# Patient Record
Sex: Female | Born: 1945
Health system: Southern US, Community
[De-identification: ages and names within clinical notes are randomized; demographics above are authoritative.]

## PROBLEM LIST (undated history)

## (undated) DIAGNOSIS — G43909 Migraine, unspecified, not intractable, without status migrainosus: Secondary | ICD-10-CM

## (undated) DIAGNOSIS — E785 Hyperlipidemia, unspecified: Secondary | ICD-10-CM

## (undated) DIAGNOSIS — I7300 Raynaud's syndrome without gangrene: Secondary | ICD-10-CM

## (undated) HISTORY — PX: DILATION AND CURETTAGE OF UTERUS: SHX78

## (undated) HISTORY — DX: Migraine, unspecified, not intractable, without status migrainosus: G43.909

## (undated) HISTORY — DX: Hyperlipidemia, unspecified: E78.5

## (undated) HISTORY — PX: MOHS SURGERY: SHX181

## (undated) HISTORY — DX: Raynaud's syndrome without gangrene: I73.00

---

## 1993-01-12 HISTORY — PX: ABDOMINAL HYSTERECTOMY: SHX81

## 2003-01-13 HISTORY — PX: COLONOSCOPY: SHX174

## 2004-03-04 ENCOUNTER — Ambulatory Visit: Payer: Self-pay | Admitting: General Surgery

## 2005-03-12 ENCOUNTER — Ambulatory Visit: Payer: Self-pay | Admitting: General Surgery

## 2005-03-23 ENCOUNTER — Ambulatory Visit: Payer: Self-pay | Admitting: General Surgery

## 2005-03-26 ENCOUNTER — Encounter: Payer: Self-pay | Admitting: Internal Medicine

## 2006-03-17 ENCOUNTER — Ambulatory Visit: Payer: Self-pay | Admitting: General Surgery

## 2006-03-30 ENCOUNTER — Ambulatory Visit: Payer: Self-pay | Admitting: General Surgery

## 2007-01-04 DIAGNOSIS — C4491 Basal cell carcinoma of skin, unspecified: Secondary | ICD-10-CM

## 2007-01-04 HISTORY — DX: Basal cell carcinoma of skin, unspecified: C44.91

## 2007-03-23 ENCOUNTER — Ambulatory Visit: Payer: Self-pay | Admitting: General Surgery

## 2007-10-27 ENCOUNTER — Ambulatory Visit: Payer: Self-pay | Admitting: Internal Medicine

## 2007-10-27 DIAGNOSIS — G43009 Migraine without aura, not intractable, without status migrainosus: Secondary | ICD-10-CM | POA: Insufficient documentation

## 2007-12-12 DIAGNOSIS — L570 Actinic keratosis: Secondary | ICD-10-CM

## 2007-12-12 HISTORY — DX: Actinic keratosis: L57.0

## 2008-01-13 LAB — HM DEXA SCAN: HM DEXA SCAN: NORMAL

## 2008-01-23 DIAGNOSIS — D239 Other benign neoplasm of skin, unspecified: Secondary | ICD-10-CM

## 2008-01-23 HISTORY — DX: Other benign neoplasm of skin, unspecified: D23.9

## 2008-03-23 ENCOUNTER — Ambulatory Visit: Payer: Self-pay | Admitting: General Surgery

## 2008-04-10 ENCOUNTER — Encounter: Payer: Self-pay | Admitting: Internal Medicine

## 2008-04-27 ENCOUNTER — Encounter: Payer: Self-pay | Admitting: Internal Medicine

## 2008-12-14 ENCOUNTER — Telehealth: Payer: Self-pay | Admitting: Internal Medicine

## 2009-03-21 ENCOUNTER — Ambulatory Visit: Payer: Self-pay | Admitting: Internal Medicine

## 2009-03-21 DIAGNOSIS — D2370 Other benign neoplasm of skin of unspecified lower limb, including hip: Secondary | ICD-10-CM | POA: Insufficient documentation

## 2009-03-22 LAB — CONVERTED CEMR LAB
Albumin: 4.1 g/dL (ref 3.5–5.2)
Alkaline Phosphatase: 65 units/L (ref 39–117)
BUN: 13 mg/dL (ref 6–23)
Basophils Absolute: 0 10*3/uL (ref 0.0–0.1)
Bilirubin, Direct: 0 mg/dL (ref 0.0–0.3)
Eosinophils Absolute: 0.3 10*3/uL (ref 0.0–0.7)
Glucose, Bld: 88 mg/dL (ref 70–99)
Lymphocytes Relative: 31.3 % (ref 12.0–46.0)
MCHC: 34 g/dL (ref 30.0–36.0)
Neutrophils Relative %: 54.1 % (ref 43.0–77.0)
Phosphorus: 3.7 mg/dL (ref 2.3–4.6)
Potassium: 4.1 meq/L (ref 3.5–5.1)
RBC: 3.73 M/uL — ABNORMAL LOW (ref 3.87–5.11)
RDW: 12.2 % (ref 11.5–14.6)

## 2009-03-25 ENCOUNTER — Ambulatory Visit: Payer: Self-pay | Admitting: General Surgery

## 2009-04-11 ENCOUNTER — Encounter: Payer: Self-pay | Admitting: Internal Medicine

## 2010-02-11 NOTE — Assessment & Plan Note (Signed)
Summary: 1:45 CPX/DLO   Vital Signs:  Patient profile:   65 year old female Weight:      125 pounds BMI:     22.76 Temp:     98.2 degrees F oral Pulse rate:   76 / minute Pulse rhythm:   regular BP sitting:   110 / 72  (left arm) Cuff size:   regular  Vitals Entered By: Mervin Hack CMA Duncan Dull) (March 21, 2009 1:31 PM) CC: adult physical   History of Present Illness: doing well No new concerns  Having some trouble sleeping due to teeth grinding Has a mouth guard but not helping Dentist is helping with her  Stopped with Dr Charlies Silvers Dr Lemar Livings for breast exams and he orders the mammograms    Allergies: 1)  ! Codeine Sulfate (Codeine Sulfate)  Past History:  Past medical, surgical, family and social histories (including risk factors) reviewed for relevance to current acute and chronic problems.  Past Medical History: Reviewed history from 10/27/2007 and no changes required. Migraines since childhood Endometriosis Raynaud's phenomenon  CONSULTANTS Dr Arvil Chaco Dr Tyson Alias breast exams  Past Surgical History: Reviewed history from 10/27/2007 and no changes required. D&C after miscarriages twice C-section Hysterectomy/BSO  06/14/1993  Family History: Dad died of lymphoma @66  Mom died from CHF @72  3 sisters--migraines run in family No CAD, DM, HTN  Social History: Reviewed history from 10/27/2007 and no changes required. 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 Regular beer  Review of Systems General:  Stopped exercise last fall---stress and lack of desire sleep isn't great but no sig daytime somnolence wears seat belt. Eyes:  Denies double vision and vision loss-1 eye. ENT:  Denies decreased hearing and ringing in ears; teeth okay in general--regular with dentist. CV:  Denies chest pain or discomfort, difficulty breathing  at night, difficulty breathing while lying down, fainting, lightheadness, palpitations, and shortness of breath with exertion. Resp:  Denies cough and shortness of breath. GI:  Denies abdominal pain, bloody stools, change in bowel habits, dark tarry stools, indigestion, nausea, and vomiting; mild swallowing problems with meat--just has to eat slowly discussed PPI if worsens. GU:  Denies dysuria and incontinence; No sexual problems. MS:  Denies joint pain and joint swelling. Derm:  Complains of lesion(s); denies rash; nodule on right calf. Neuro:  Complains of headaches; denies numbness, tingling, and weakness; uses migraine meds occ topamax for prophylaxis----only gets migraines every 3-4 months or so. Psych:  Denies anxiety and depression. Heme:  Denies abnormal bruising and enlarge lymph nodes. Allergy:  Denies seasonal allergies and sneezing.  Physical Exam  General:  alert and normal appearance.   Eyes:  pupils equal, pupils round, pupils reactive to light, and no optic disk abnormalities.   Ears:  R ear normal and L ear normal.   Mouth:  no erythema, no exudates, and no lesions.   Neck:  supple, no masses, no thyromegaly, no carotid bruits, and no cervical lymphadenopathy.   Lungs:  normal respiratory effort and normal breath sounds.   Heart:  normal rate, regular rhythm, no murmur, and no gallop.   Abdomen:  soft and non-tender.   Msk:  no joint tenderness and no joint swelling.   Pulses:  normal in feet Extremities:  no edema Neurologic:  alert & oriented X3, strength normal in all extremities, and gait normal.   Skin:  no rashes and no ulcerations.   dermatofibroma on  mid lateral right calf Axillary Nodes:  No palpable lymphadenopathy Psych:  normally interactive, good eye contact, not anxious appearing, and not depressed appearing.     Impression & Recommendations:  Problem # 1:  PREVENTIVE HEALTH CARE (ICD-V70.0) Assessment Comment Only healthy up to date with  colon will get mammo through Dr Lemar Livings  Problem # 2:  MIGRAINE HEADACHE (ICD-346.90) Assessment: Unchanged  still gets occ so will continue the topamax  Her updated medication list for this problem includes:    Relpax 40 Mg Tabs (Eletriptan hydrobromide) .Marland Kitchen... Take one by mouth at onset of headache  Orders: TLB-Renal Function Panel (80069-RENAL) TLB-CBC Platelet - w/Differential (85025-CBCD) TLB-Hepatic/Liver Function Pnl (80076-HEPATIC) TLB-TSH (Thyroid Stimulating Hormone) (84443-TSH) Venipuncture (35573)  Problem # 3:  DERMATOFIBROMA, LEG, RIGHT (ICD-216.7) Assessment: New  treated with liquid nitrogen 1 minute x 2 tolerated well and discussed home care  Orders: Cryotherapy/Destruction benign or premalignant lesion (1st lesion)  (17000)  Complete Medication List: 1)  Topamax 50 Mg Tabs (Topiramate) .... Take 2 by mouth daily 2)  Relpax 40 Mg Tabs (Eletriptan hydrobromide) .... Take one by mouth at onset of headache 3)  Super B Complex Tabs (B complex-c) .... Take one by mouth daily 4)  Centrum Silver Tabs (Multiple vitamins-minerals) .... Take one by mouth daily  Patient Instructions: 1)  Please start omeprazole 20mg  at bedtime if you have any worsening of your swallowing problems 2)  Please try a general multivitamin as well as additional vitamin D 400 international units  3)  Please schedule a follow-up appointment in 1 year.   Current Allergies (reviewed today): ! CODEINE SULFATE (CODEINE SULFATE)

## 2010-02-11 NOTE — Letter (Signed)
Summary: [facility]  [facility]   Imported By: Maryln Gottron 04/22/2009 13:02:43  _____________________________________________________________________  External Attachment:    Type:   Image     Comment:   External Document  Appended Document: [facility]    Clinical Lists Changes  Observations: Added new observation of MAMMOGRAM: No specific mammographic evidence of malignancy.  Assessment: BIRADS 1.  (03/25/2009 13:08)       Mammogram  Procedure date:  03/25/2009  Findings:      No specific mammographic evidence of malignancy.  Assessment: BIRADS 1.

## 2010-03-06 ENCOUNTER — Encounter: Payer: Self-pay | Admitting: Internal Medicine

## 2010-03-06 ENCOUNTER — Ambulatory Visit (INDEPENDENT_AMBULATORY_CARE_PROVIDER_SITE_OTHER): Payer: BC Managed Care – PPO | Admitting: Internal Medicine

## 2010-03-06 DIAGNOSIS — J019 Acute sinusitis, unspecified: Secondary | ICD-10-CM | POA: Insufficient documentation

## 2010-03-10 ENCOUNTER — Encounter: Payer: Self-pay | Admitting: Internal Medicine

## 2010-03-11 ENCOUNTER — Encounter: Payer: Self-pay | Admitting: Internal Medicine

## 2010-03-11 NOTE — Assessment & Plan Note (Signed)
Summary: SORE THROAT , COUGH- APPT SCHED FOR 1:45PM   Vital Signs:  Patient profile:   65 year old female Weight:      129 pounds BMI:     23.49 Temp:     98.9 degrees F oral Pulse rate:   114 / minute Pulse rhythm:   regular Resp:     14 per minute BP sitting:   118 / 85  (left arm) Cuff size:   regular  Vitals Entered By: Jacqueline Meza CMA Duncan Dull) (March 06, 2010 1:30 PM) CC: sore throat, congestion, achey   History of Present Illness: has been sick for 4-5 days Scratchy throat at first and gradual worsening Congesiton in head without much  mucous Sinus headache Not much drainage Now with cough  No fever Has had some chills and sweats No SOB--just some pain in throat with breathing  Allergies: 1)  ! Codeine Sulfate (Codeine Sulfate)  Past History:  Past medical, surgical, family and social histories (including risk factors) reviewed for relevance to current acute and chronic problems.  Past Medical History: Reviewed history from 10/27/2007 and no changes required. Migraines since childhood Endometriosis Raynaud's phenomenon  CONSULTANTS Dr Arvil Chaco Dr Tyson Alias breast exams  Past Surgical History: Reviewed history from 10/27/2007 and no changes required. D&C after miscarriages twice C-section Hysterectomy/BSO  1995  Family History: Reviewed history from 03/21/2009 and no changes required. Dad died of lymphoma @66  Mom died from CHF @72  3 sisters--migraines run in family No CAD, DM, HTN  Social History: Reviewed history from 10/27/2007 and no changes required. 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 Regular beer  Review of Systems       Missed work only on Monday---mostly due to snow No vomiting  No diarrhea appetite is okay  Physical Exam  General:  alert.  NAD Head:  no frontal or maxillary  tenderness Ears:  R ear normal and L ear normal.   Nose:  moderate inflammation and swelling slight purulent mucus on left Mouth:  mild pharyngeal injection without exudate Neck:  supple, no masses, and no cervical lymphadenopathy.   Lungs:  normal respiratory effort, no intercostal retractions, no accessory muscle use, normal breath sounds, no crackles, and no wheezes.     Impression & Recommendations:  Problem # 1:  SINUSITIS - ACUTE-NOS (ICD-461.9) Assessment New  may still be viral but worsening daily discussed supportive care with analgesics, Vick's, etc  will give Rx for amoxil to start if worsens  Her updated medication list for this problem includes:    Amoxicillin 500 Mg Tabs (Amoxicillin) .Marland Kitchen... 2 tabs by mouth two times a day for sinus infection  Complete Medication List: 1)  Relpax 40 Mg Tabs (Eletriptan hydrobromide) .... Take one by mouth at onset of headache 2)  Amoxicillin 500 Mg Tabs (Amoxicillin) .... 2 tabs by mouth two times a day for sinus infection  Patient Instructions: 1)  Please set up your physical at your convenience Prescriptions: AMOXICILLIN 500 MG TABS (AMOXICILLIN) 2 tabs by mouth two times a day for sinus infection  #40 x 0   Entered and Authorized by:   Jacqueline Salt MD   Signed by:   Jacqueline Salt MD on 03/06/2010   Method used:   Print then Give to Patient   RxID:   [id number] RELPAX 40 MG TABS (ELETRIPTAN HYDROBROMIDE) Take one by mouth at onset of headache  #8 x 3  Entered and Authorized by:   Jacqueline Salt MD   Signed by:   Jacqueline Salt MD on 03/06/2010   Method used:   Print then Give to Patient   RxID:   [id number]      Current Allergies (reviewed today): ! CODEINE SULFATE (CODEINE SULFATE)

## 2010-03-28 ENCOUNTER — Ambulatory Visit: Payer: Self-pay | Admitting: General Surgery

## 2010-04-01 ENCOUNTER — Encounter: Payer: BC Managed Care – PPO | Admitting: Internal Medicine

## 2010-04-11 ENCOUNTER — Ambulatory Visit (INDEPENDENT_AMBULATORY_CARE_PROVIDER_SITE_OTHER): Payer: BC Managed Care – PPO | Admitting: Internal Medicine

## 2010-04-11 ENCOUNTER — Encounter: Payer: Self-pay | Admitting: Internal Medicine

## 2010-04-11 VITALS — BP 120/70 | HR 70 | Temp 98.5°F | Ht 62.0 in | Wt 129.0 lb

## 2010-04-11 DIAGNOSIS — Z0000 Encounter for general adult medical examination without abnormal findings: Secondary | ICD-10-CM | POA: Insufficient documentation

## 2010-04-11 NOTE — Progress Notes (Signed)
Subjective:    Patient ID: Jacqueline Meza, female    DOB: 03/29/1945, 65 y.o.   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 Migraines     since childhood  . Endometriosis   . Raynaud's phenomenon     Past Surgical History  Procedure Date  . Dilation and curettage of uterus     done after miscarriages twice  . Cesarean section   . Abdominal hysterectomy 01/12/1993    Family History  Problem Relation Age of Onset  . Migraines Sister   . Migraines Sister   . Migraines Sister     History   Social History  . Marital Status: Divorced    Spouse Name: N/A    Number of Children: 1  . Years of Education: N/A   Occupational History  . Curriculum development     [employer]   Social History Main Topics  . Smoking status: Never Smoker   . Smokeless tobacco: Not on file  . Alcohol Use: Yes     regular beer  . Drug Use: Not on file  . Sexually Active: Not on file   Other Topics Concern  . Not on file   Social History Narrative   Lives with twin sister [name]     Review of Systems  Constitutional: Positive for fatigue. Negative for appetite change and unexpected weight change.       [occ fatigue--no excessive somnolence Wears seat belt HENT: Negative for hearing loss, congestion, sore throat, rhinorrhea, postnasal drip and tinnitus.   Eyes: Negative for visual disturbance.       [Corneal tear found on left--went to [place]. Seems to be healing No vision loss Respiratory: Negative for cough and shortness of breath.   Cardiovascular: Negative for chest pain, palpitations and leg swelling.  Gastrointestinal: Negative for nausea, vomiting, constipation and  blood in stool.       [No indigestion Genitourinary: Negative for dysuria and difficulty urinating.       [No incontinence Musculoskeletal: Negative for back pain, joint swelling and arthralgias.  Skin: Negative for rash.       [No suspicious lesions now Sees Dr Gwen Pounds every 6 months Neurological: Positive for headaches. Negative for dizziness, syncope, weakness and numbness.       [Chronic headaches--relpax helps Hematological: Negative for adenopathy. Does not bruise/bleed easily.  Psychiatric/Behavioral: Negative for sleep disturbance and dysphoric mood. The patient is not nervous/anxious.        [Chronic sleep problems       Objective:   Physical Exam  Constitutional: She is oriented to person, place, and time. She appears well-developed and well-nourished. No distress.  HENT:  Head: Normocephalic and atraumatic.  Mouth/Throat: Oropharynx is clear and moist. No oropharyngeal exudate.       TM's normal  Eyes: Conjunctivae and EOM are normal. Pupils are equal, round, and reactive to light. Right eye exhibits no discharge. Left eye exhibits no discharge.       Fundi benign  Neck: Normal range of motion. Neck supple. No thyromegaly present.  Cardiovascular: Normal rate, regular rhythm, normal heart sounds and intact distal pulses.  Exam reveals no gallop.   No murmur heard. Pulmonary/Chest: Effort normal and breath sounds normal.  No respiratory distress. She has no wheezes. She has no rales.  Abdominal: Soft. She exhibits no mass. There is no tenderness.  Musculoskeletal: Normal range of motion. She exhibits no edema and no tenderness.  Lymphadenopathy:    She has no cervical adenopathy.  Neurological: She is alert and oriented to person, place, and time. She exhibits normal muscle tone.       Normal strength  Skin: Skin is warm. No rash noted. No erythema.  Psychiatric: She has a normal mood and affect. Her behavior is normal. Judgment and thought content normal.           Assessment & Plan:

## 2010-10-02 ENCOUNTER — Telehealth: Payer: Self-pay | Admitting: *Deleted

## 2010-10-02 NOTE — Telephone Encounter (Signed)
Spoke with patient and advised results   

## 2010-10-02 NOTE — Telephone Encounter (Signed)
Patient walked in to front desk and dropped off lab results done by [facility] and wanted Dr.Letvak to look at her labs and give his advice. Form on your desk

## 2010-10-02 NOTE — Telephone Encounter (Signed)
Please let her know the chol levels are actually pretty good Thyroid is okay---I might just repeat this at her next physical Liver test is up some but I don't usually check the GGT since it can be elevated with just 1 alcoholic drink the day before or other such factors. Again, I might just recheck at her next physical Blood count is fine but may need to check vitamin b12. If she is a vegetarian, or close, she should take a daily B12 supplement

## 2010-10-29 ENCOUNTER — Other Ambulatory Visit: Payer: Self-pay | Admitting: Internal Medicine

## 2010-11-30 ENCOUNTER — Other Ambulatory Visit: Payer: Self-pay | Admitting: Internal Medicine

## 2010-12-01 NOTE — Telephone Encounter (Signed)
Ok to refill 

## 2010-12-01 NOTE — Telephone Encounter (Signed)
Rx done electronically 

## 2011-04-10 ENCOUNTER — Ambulatory Visit: Payer: BC Managed Care – PPO | Admitting: Internal Medicine

## 2011-04-10 ENCOUNTER — Other Ambulatory Visit: Payer: Self-pay | Admitting: Internal Medicine

## 2011-04-13 NOTE — Telephone Encounter (Signed)
rx sent to pharmacy by e-script  

## 2011-04-13 NOTE — Telephone Encounter (Signed)
Okay #8 x 3 

## 2011-04-20 ENCOUNTER — Ambulatory Visit (INDEPENDENT_AMBULATORY_CARE_PROVIDER_SITE_OTHER): Payer: BC Managed Care – PPO | Admitting: Internal Medicine

## 2011-04-20 ENCOUNTER — Encounter: Payer: Self-pay | Admitting: Internal Medicine

## 2011-04-20 ENCOUNTER — Ambulatory Visit: Payer: Self-pay | Admitting: Internal Medicine

## 2011-04-20 VITALS — BP 112/72 | HR 80 | Temp 98.3°F | Ht 62.0 in | Wt 135.0 lb

## 2011-04-20 DIAGNOSIS — G43909 Migraine, unspecified, not intractable, without status migrainosus: Secondary | ICD-10-CM

## 2011-04-20 DIAGNOSIS — Z0000 Encounter for general adult medical examination without abnormal findings: Secondary | ICD-10-CM

## 2011-04-20 LAB — CBC WITH DIFFERENTIAL/PLATELET
Basophils Absolute: 0 10*3/uL (ref 0.0–0.1)
Eosinophils Absolute: 0.2 10*3/uL (ref 0.0–0.7)
HCT: 42.6 % (ref 36.0–46.0)
Hemoglobin: 14.3 g/dL (ref 12.0–15.0)
Lymphs Abs: 1.9 10*3/uL (ref 0.7–4.0)
MCHC: 33.7 g/dL (ref 30.0–36.0)
MCV: 104.4 fl — ABNORMAL HIGH (ref 78.0–100.0)
Monocytes Absolute: 0.5 10*3/uL (ref 0.1–1.0)
Monocytes Relative: 10 % (ref 3.0–12.0)
Neutro Abs: 2.8 10*3/uL (ref 1.4–7.7)
Platelets: 196 10*3/uL (ref 150.0–400.0)
RDW: 13.6 % (ref 11.5–14.6)

## 2011-04-20 LAB — LIPID PANEL
HDL: 74.8 mg/dL (ref >39.00)
Triglycerides: 77 mg/dL (ref 0.0–149.0)

## 2011-04-20 LAB — BASIC METABOLIC PANEL
BUN: 15 mg/dL (ref 6–23)
CO2: 27 mEq/L (ref 19–32)
GFR: 88.03 mL/min (ref >60.00)
Glucose, Bld: 92 mg/dL (ref 70–99)
Potassium: 4.5 mEq/L (ref 3.5–5.1)

## 2011-04-20 LAB — HEPATIC FUNCTION PANEL
Albumin: 4.6 g/dL (ref 3.5–5.2)
Total Protein: 7.2 g/dL (ref 6.0–8.3)

## 2011-04-20 LAB — TSH: TSH: 1.83 u[IU]/mL (ref 0.35–5.50)

## 2011-04-20 MED ORDER — TOPIRAMATE 25 MG PO TABS: 25.0000 mg | ORAL_TABLET | Freq: Every day | ORAL | Status: DC

## 2011-04-20 NOTE — Assessment & Plan Note (Signed)
Has been worsening Will go back on topirimate Start at 25mg  and increase

## 2011-04-20 NOTE — Assessment & Plan Note (Signed)
Healthy Has mammo scheduled this afternoon zostavax if covered

## 2011-04-20 NOTE — Progress Notes (Signed)
Subjective:    Patient ID: Jacqueline Meza, female    DOB: [birth year], 66 y.o.   MRN: [id number]  HPI Discouraged by her weight gain Wakes 3-5 miles many days, and water aerobics Really careful with diet  Has retired Doing well with this No longer goes to Dr Lemar Livings for breast exams  More migraines of late topamax did prevent As many as 2 a week----now close to one a week relpax mostly will abort---occ needs 2nd dose to clear completely  Current Outpatient Prescriptions on File Prior to Visit  Medication Sig Dispense Refill  . Multiple Vitamins-Minerals (ICAPS PO) Take by mouth daily.        . RELPAX 40 MG tablet TAKE 1 TABLET BY MOUTH AT ONSET OF HEADACHE  8 tablet  3    Allergies  Allergen Reactions  . Codeine Sulfate     REACTION: headache and nausea    Past Medical History  Diagnosis Date  . Migraines     since childhood  . Endometriosis   . Raynaud's phenomenon     Past Surgical History  Procedure Date  . Dilation and curettage of uterus     done after miscarriages twice  . Cesarean section   . Abdominal hysterectomy 01/12/1993    Family History  Problem Relation Age of Onset  . Migraines Sister   . Migraines Sister   . Migraines Sister      Review of Systems  Constitutional: Positive for unexpected weight change. Negative for fatigue.       Wears seat belt  HENT: Negative for hearing loss, congestion, rhinorrhea, dental problem and tinnitus.        Regular with dentist  Eyes: Negative for visual disturbance.       No diplopia or unilateral vision loss Watching retina closely ---on eye vitamins now per ophtho  Respiratory: Negative for cough, chest tightness and shortness of breath.   Cardiovascular: Negative for chest pain, palpitations and leg swelling.  Gastrointestinal: Negative for nausea, vomiting, abdominal pain, constipation and blood in stool.       No heartburn  Genitourinary: Negative for dysuria, urgency, difficulty urinating and  dyspareunia.  Musculoskeletal: Positive for myalgias. Negative for back pain and arthralgias.       Did have stiff neck for a while---got muscle relaxer at urgent care that she couldn't take. Chiropractic helped (Dr Jeannetta Ellis)  Skin: Negative for rash.       No suspicious lesions  Neurological: Positive for headaches. Negative for dizziness, syncope, weakness, light-headedness and numbness.  Hematological: Negative for adenopathy. Does not bruise/bleed easily.  Psychiatric/Behavioral: Positive for sleep disturbance. Negative for dysphoric mood. The patient is not nervous/anxious.        Never has slept well but no daytime somnolence       Objective:   Physical Exam  Constitutional: She is oriented to person, place, and time. She appears well-developed and well-nourished. No distress.  HENT:  Head: Normocephalic and atraumatic.  Right Ear: External ear normal.  Left Ear: External ear normal.  Mouth/Throat: Oropharynx is clear and moist. No oropharyngeal exudate.  Eyes: Conjunctivae and EOM are normal. Pupils are equal, round, and reactive to light.  Neck: Normal range of motion. Neck supple. No thyromegaly present.  Cardiovascular: Normal rate, regular rhythm, normal heart sounds and intact distal pulses.  Exam reveals no gallop.   No murmur heard. Pulmonary/Chest: Effort normal and breath sounds normal. No respiratory distress. She has no wheezes. She has no rales.  Abdominal: Soft. Bowel  sounds are normal. There is no tenderness.  Genitourinary:       No breast masses or discharge  Musculoskeletal: Normal range of motion. She exhibits no edema and no tenderness.  Lymphadenopathy:    She has no cervical adenopathy.    She has no axillary adenopathy.  Neurological: She is alert and oriented to person, place, and time.  Skin: No rash noted. No erythema.  Psychiatric: She has a normal mood and affect. Her behavior is normal. Judgment and thought content normal.          Assessment  & Plan:

## 2011-04-21 ENCOUNTER — Encounter: Payer: Self-pay | Admitting: *Deleted

## 2011-04-21 LAB — LDL CHOLESTEROL, DIRECT: Direct LDL: 185.8 mg/dL

## 2011-06-09 ENCOUNTER — Ambulatory Visit (INDEPENDENT_AMBULATORY_CARE_PROVIDER_SITE_OTHER): Payer: BC Managed Care – PPO | Admitting: Family Medicine

## 2011-06-09 ENCOUNTER — Encounter: Payer: Self-pay | Admitting: Family Medicine

## 2011-06-09 VITALS — BP 124/78 | HR 80 | Temp 98.2°F | Wt 132.0 lb

## 2011-06-09 DIAGNOSIS — W548XXA Other contact with dog, initial encounter: Secondary | ICD-10-CM

## 2011-06-09 DIAGNOSIS — T07XXXA Unspecified multiple injuries, initial encounter: Secondary | ICD-10-CM

## 2011-06-09 DIAGNOSIS — W64XXXA Exposure to other animate mechanical forces, initial encounter: Secondary | ICD-10-CM

## 2011-06-09 MED ORDER — CEPHALEXIN 500 MG PO CAPS: 500.0000 mg | ORAL_CAPSULE | Freq: Two times a day (BID) | ORAL | Status: AC

## 2011-06-09 NOTE — Progress Notes (Signed)
  Subjective:    Patient ID: Jacqueline Meza, female    DOB: Aug 16, 1945, 66 y.o.   MRN: [id number]  HPI CC: scratch by dog   Yesterday at pool, scratched by bulldog, large scratch right lower leg.  Happened about 3pm yesterday.  Swelling better but continued oozing.  Cleaned with peroxide then soapy water then wrapping with gauze.  Has been using friend's silver sulfadine.    Tetanus shot 10/2007 according to chart.  Review of Systems Per HPI    Objective:   Physical Exam  Nursing note and vitals reviewed. Constitutional: She appears well-developed and well-nourished. No distress.  Cardiovascular:  Pulses:      Dorsalis pedis pulses are 2+ on the right side, and 2+ on the left side.  Skin:       Right lower lateral leg with abrasion/laceration about 1.5cm size, with skin denuded in dog ear conformation, denuded skin has flapped over on itself, subcutaneous tissue visible.      Assessment & Plan:

## 2011-06-09 NOTE — Assessment & Plan Note (Addendum)
Wound irrigated with normal saline and cleaned x1 with betadine, small snip of nonviable skin cut off with sterile scissors.  However rest of skin, even flap left on to provide more wound coverage/protection Dressed wound with triple abx ointment. rec stop silver sulfadine. ppx abx given dog scratch - keflex twice daily for 7 days. No sutures as dirty wound.

## 2011-06-09 NOTE — Patient Instructions (Addendum)
Keep leg elevated as much as able. Daily dressing changes. Take antibiotic (keflex) twice daily for 5 days. Please return to see [place] if spreading redness, drainage of pus, or other concerns. Stop silver sulfadene.  Dress wound with triple antibiotic ointment.

## 2011-06-12 ENCOUNTER — Encounter: Payer: Self-pay | Admitting: Family Medicine

## 2011-06-12 ENCOUNTER — Ambulatory Visit (INDEPENDENT_AMBULATORY_CARE_PROVIDER_SITE_OTHER): Payer: BC Managed Care – PPO | Admitting: Family Medicine

## 2011-06-12 VITALS — BP 110/76 | HR 68 | Temp 97.8°F | Wt 130.0 lb

## 2011-06-12 DIAGNOSIS — W548XXA Other contact with dog, initial encounter: Secondary | ICD-10-CM

## 2011-06-12 DIAGNOSIS — T07XXXA Unspecified multiple injuries, initial encounter: Secondary | ICD-10-CM

## 2011-06-12 DIAGNOSIS — W64XXXA Exposure to other animate mechanical forces, initial encounter: Secondary | ICD-10-CM

## 2011-06-12 NOTE — Patient Instructions (Signed)
Finish antibiotic course. Continue treatment as up to now. Let us know if starts turning red around area, or any draining pus.

## 2011-06-12 NOTE — Assessment & Plan Note (Signed)
Improved. Continue treatment. Finish keflex course. rtc in 1 1/2 wks if needed, o/w may cancel appt.

## 2011-06-12 NOTE — Progress Notes (Signed)
  Subjective:    Patient ID: Jacqueline Meza, female    DOB: Oct 31, 1945, 66 y.o.   MRN: [id number]  HPI CC: f/u dog scratch  See prior note for details.  Actually wound healing well.  No fevers/chills, spreading redness or drainage.  Still mildly bleeding with dressing changes. Changing daily, washing with soapy water.  Gauze sticks to wound.  Review of Systems Per HPI    Objective:   Physical Exam NAD R lower leg with healing wound, good granulation tissue, no surrounding erythema.  Dog ear skin tear actually seems to be healing    Assessment & Plan:

## 2011-06-24 ENCOUNTER — Ambulatory Visit (INDEPENDENT_AMBULATORY_CARE_PROVIDER_SITE_OTHER): Payer: BC Managed Care – PPO | Admitting: Family Medicine

## 2011-06-24 ENCOUNTER — Encounter: Payer: Self-pay | Admitting: Family Medicine

## 2011-06-24 VITALS — BP 118/80 | HR 80 | Temp 97.8°F | Wt 129.2 lb

## 2011-06-24 DIAGNOSIS — W64XXXA Exposure to other animate mechanical forces, initial encounter: Secondary | ICD-10-CM

## 2011-06-24 DIAGNOSIS — W548XXA Other contact with dog, initial encounter: Secondary | ICD-10-CM

## 2011-06-24 DIAGNOSIS — T07XXXA Unspecified multiple injuries, initial encounter: Secondary | ICD-10-CM

## 2011-06-24 NOTE — Patient Instructions (Addendum)
Hydrocolloid dressing today.  May keep on until falls off.  May use 2nd half if first falls off. Return in 1 wk for f/u.

## 2011-06-24 NOTE — Progress Notes (Signed)
  Subjective:    Patient ID: Jacqueline Meza, female    DOB: Mar 22, 1945, 66 y.o.   MRN: [id number]  HPI CC: wound check  Here for recheck wound, see prior notes for details - doing well.  No redness or fevers.  Daily cleaning wound with soap and water, dressing changes, using triple abx.   Review of Systems Per HPI    Objective:   Physical Exam NAD, WDWN CF Right lower leg with 2cmx2cm healing wound, no significant exudate, no surrounding erythema.  Granulation tissue present.     Assessment & Plan:

## 2011-06-24 NOTE — Assessment & Plan Note (Signed)
Continued healing, albeit slowly. Placed in hydrocolloid dressing today. rtc 1 wk for recheck. Completed keflex course.

## 2011-07-01 ENCOUNTER — Ambulatory Visit (INDEPENDENT_AMBULATORY_CARE_PROVIDER_SITE_OTHER): Payer: BC Managed Care – PPO | Admitting: Family Medicine

## 2011-07-01 ENCOUNTER — Encounter: Payer: Self-pay | Admitting: Family Medicine

## 2011-07-01 VITALS — BP 122/78 | HR 63 | Temp 97.8°F | Wt 129.2 lb

## 2011-07-01 DIAGNOSIS — W64XXXA Exposure to other animate mechanical forces, initial encounter: Secondary | ICD-10-CM

## 2011-07-01 DIAGNOSIS — W548XXA Other contact with dog, initial encounter: Secondary | ICD-10-CM

## 2011-07-01 DIAGNOSIS — T07XXXA Unspecified multiple injuries, initial encounter: Secondary | ICD-10-CM

## 2011-07-01 NOTE — Patient Instructions (Signed)
Return in 2-3 weeks or as needed. Good to see you today, call us with questions. May try to shower with artificial skin.

## 2011-07-01 NOTE — Progress Notes (Signed)
See prior notes for detail  CC: f/u dog scratch  No worsening pain, fevers, spreading redness.  Area improved, granulation tissue with central ulceration, overall smaller diameter wound. duoderm removed and area irrigated with NS. New duoderm placed.

## 2011-07-01 NOTE — Assessment & Plan Note (Signed)
Continued healing. Replaced hydrocolloid dressing. rtc 2-3 wks for recheck. Provided with hydrocolloid for 2 more weeks.

## 2011-07-23 ENCOUNTER — Ambulatory Visit (INDEPENDENT_AMBULATORY_CARE_PROVIDER_SITE_OTHER): Payer: BC Managed Care – PPO | Admitting: Family Medicine

## 2011-07-23 ENCOUNTER — Encounter: Payer: Self-pay | Admitting: Family Medicine

## 2011-07-23 VITALS — BP 100/72 | HR 60 | Temp 97.9°F | Wt 129.0 lb

## 2011-07-23 DIAGNOSIS — W64XXXA Exposure to other animate mechanical forces, initial encounter: Secondary | ICD-10-CM

## 2011-07-23 DIAGNOSIS — W548XXA Other contact with dog, initial encounter: Secondary | ICD-10-CM

## 2011-07-23 DIAGNOSIS — T07XXXA Unspecified multiple injuries, initial encounter: Secondary | ICD-10-CM

## 2011-07-23 NOTE — Assessment & Plan Note (Signed)
As fully epithelialized, ok to go into pool.   RTC PRN.

## 2011-07-23 NOTE — Progress Notes (Signed)
See prior notes for details  CC: f/u dog scratch   No worsening pain, fevers, spreading redness.  Last hydrocolloid fell off on Tuesday.  PE: WDWN CF NAD Fully epithelialized wound on right anterior shin

## 2011-08-18 ENCOUNTER — Other Ambulatory Visit: Payer: Self-pay | Admitting: Internal Medicine

## 2011-09-08 ENCOUNTER — Other Ambulatory Visit: Payer: Self-pay | Admitting: Internal Medicine

## 2012-01-19 ENCOUNTER — Other Ambulatory Visit: Payer: Self-pay | Admitting: Internal Medicine

## 2012-01-19 NOTE — Telephone Encounter (Signed)
Okay #8 x 3

## 2012-01-19 NOTE — Telephone Encounter (Signed)
rx sent to pharmacy by e-script  

## 2012-03-25 LAB — HM MAMMOGRAPHY: HM MAMMO: NORMAL

## 2012-04-20 ENCOUNTER — Ambulatory Visit: Payer: Self-pay | Admitting: Internal Medicine

## 2012-04-20 ENCOUNTER — Encounter: Payer: Self-pay | Admitting: Internal Medicine

## 2012-04-22 ENCOUNTER — Encounter: Payer: Self-pay | Admitting: *Deleted

## 2012-04-22 ENCOUNTER — Ambulatory Visit (INDEPENDENT_AMBULATORY_CARE_PROVIDER_SITE_OTHER): Payer: Medicare Other | Admitting: Internal Medicine

## 2012-04-22 ENCOUNTER — Encounter: Payer: Self-pay | Admitting: Internal Medicine

## 2012-04-22 VITALS — BP 120/76 | HR 60 | Temp 97.2°F | Ht 62.0 in | Wt 130.0 lb

## 2012-04-22 DIAGNOSIS — G43909 Migraine, unspecified, not intractable, without status migrainosus: Secondary | ICD-10-CM

## 2012-04-22 DIAGNOSIS — Z0000 Encounter for general adult medical examination without abnormal findings: Secondary | ICD-10-CM

## 2012-04-22 DIAGNOSIS — E785 Hyperlipidemia, unspecified: Secondary | ICD-10-CM

## 2012-04-22 DIAGNOSIS — Z23 Encounter for immunization: Secondary | ICD-10-CM

## 2012-04-22 LAB — CBC WITH DIFFERENTIAL/PLATELET
Basophils Relative: 0.3 % (ref 0.0–3.0)
Eosinophils Absolute: 0.2 10*3/uL (ref 0.0–0.7)
HCT: 39.9 % (ref 36.0–46.0)
Lymphs Abs: 2.1 10*3/uL (ref 0.7–4.0)
MCHC: 33.5 g/dL (ref 30.0–36.0)
MCV: 104.6 fl — ABNORMAL HIGH (ref 78.0–100.0)
Monocytes Absolute: 0.6 10*3/uL (ref 0.1–1.0)
Neutrophils Relative %: 51.6 % (ref 43.0–77.0)
RBC: 3.81 Mil/uL — ABNORMAL LOW (ref 3.87–5.11)

## 2012-04-22 LAB — HEPATIC FUNCTION PANEL
ALT: 21 U/L (ref 0–35)
Albumin: 4.3 g/dL (ref 3.5–5.2)
Total Bilirubin: 0.8 mg/dL (ref 0.3–1.2)
Total Protein: 7.1 g/dL (ref 6.0–8.3)

## 2012-04-22 LAB — LIPID PANEL
Cholesterol: 224 mg/dL — ABNORMAL HIGH (ref 0–200)
Total CHOL/HDL Ratio: 3

## 2012-04-22 LAB — BASIC METABOLIC PANEL
BUN: 19 mg/dL (ref 6–23)
CO2: 22 mEq/L (ref 19–32)
Chloride: 104 mEq/L (ref 96–112)
Creatinine, Ser: 0.6 mg/dL (ref 0.4–1.2)

## 2012-04-22 NOTE — Assessment & Plan Note (Signed)
Satisfied with regimen 

## 2012-04-22 NOTE — Assessment & Plan Note (Signed)
Discussed primary prevention with meds---we will not do this Overall low risk and high HDL Recheck labs though

## 2012-04-22 NOTE — Progress Notes (Signed)
Subjective:    Patient ID: Jacqueline Meza, female    DOB: [birth year], 67 y.o.   MRN: [id number]  HPI Here for physical Doing well Continues to exercise regularly---walking and water aerobics Weight down 5# in 1 year Dog bite wound has healed Reviewed advanced directives  Just had mammogram Due for pneumovax  Migraines are much better on topamax Still has problems when the weather is changing  Current Outpatient Prescriptions on File Prior to Visit  Medication Sig Dispense Refill  . Multiple Vitamins-Minerals (ICAPS PO) Take by mouth daily.        . RELPAX 40 MG tablet TAKE 1 TABLET BY MOUTH AT ONSET OF HEADACHE  8 tablet  3  . topiramate (TOPAMAX) 100 MG tablet TAKE 1 TABLET BY MOUTH EVERY DAY  30 tablet  11   No current facility-administered medications on file prior to visit.    Allergies  Allergen Reactions  . Codeine Sulfate     REACTION: headache and nausea    Past Medical History  Diagnosis Date  . Migraines     since childhood  . Endometriosis   . Raynaud's phenomenon   . Hyperlipidemia     Past Surgical History  Procedure Laterality Date  . Dilation and curettage of uterus      done after miscarriages twice  . Cesarean section    . Abdominal hysterectomy  01/12/1993    Family History  Problem Relation Age of Onset  . Migraines Sister   . Migraines Sister   . Migraines Sister     History   Social History  . Marital Status: Divorced    Spouse Name: N/A    Number of Children: 1  . Years of Education: N/A   Occupational History  . Retired--curriculum facilitator     [employer]   Social History Main Topics  . Smoking status: Never Smoker   . Smokeless tobacco: Never Used  . Alcohol Use: Yes     Comment: regular beer  . Drug Use: Not on file  . Sexually Active: Not on file   Other Topics Concern  . Not on file   Social History Narrative   No living will   Request sister Jacqueline Meza as health care POA.   Requests DNR  after discussion --form done 04/22/12   No tube feedings if cognitively unaware   Review of Systems  Constitutional: Negative for fatigue and unexpected weight change.       Wears seat belt  HENT: Negative for hearing loss, congestion, rhinorrhea, dental problem and tinnitus.        Regular with dentist  Eyes: Negative for visual disturbance.       No diplopia or unilateral vision loss  Respiratory: Negative for cough, chest tightness and shortness of breath.   Cardiovascular: Negative for chest pain, palpitations and leg swelling.       Stable exercise tolerance  Gastrointestinal: Negative for nausea, vomiting, abdominal pain, constipation and blood in stool.       No heartburn  Endocrine: Negative for cold intolerance and heat intolerance.  Genitourinary: Negative for dysuria and difficulty urinating.       No sex --no problem  Musculoskeletal: Positive for back pain and arthralgias.       Goes to chiropractor for back Occ ibuprofen-- elbow will hurt at times  Skin: Negative for rash.       Did have precancerous lesion removed from right leg--Dr Gwen Pounds. Goes yearly  Allergic/Immunologic: Negative for environmental allergies and immunocompromised  state.  Neurological: Negative for dizziness, syncope, weakness, light-headedness and numbness.  Hematological: Negative for adenopathy. Does not bruise/bleed easily.  Psychiatric/Behavioral: Positive for sleep disturbance. Negative for dysphoric mood. The patient is not nervous/anxious.        Chronic sleep problems but no daytime somnolence       Objective:   Physical Exam  Constitutional: She is oriented to person, place, and time. She appears well-developed and well-nourished. No distress.  HENT:  Head: Normocephalic and atraumatic.  Right Ear: External ear normal.  Left Ear: External ear normal.  Mouth/Throat: Oropharynx is clear and moist.  Eyes: Conjunctivae and EOM are normal. Pupils are equal, round, and reactive to light.   Neck: Normal range of motion. Neck supple. No thyromegaly present.  Cardiovascular: Normal rate, regular rhythm, normal heart sounds and intact distal pulses.  Exam reveals no gallop.   No murmur heard. Pulmonary/Chest: Effort normal and breath sounds normal. No respiratory distress. She has no wheezes. She has no rales.  Abdominal: Soft. There is no tenderness.  Genitourinary:  Moderate periareolar cystic changes in both breasts-- R>L  Musculoskeletal: She exhibits no edema and no tenderness.  Lymphadenopathy:    She has no cervical adenopathy.  Neurological: She is alert and oriented to person, place, and time.  Skin: No rash noted. No erythema.  Psychiatric: She has a normal mood and affect. Her behavior is normal.          Assessment & Plan:

## 2012-04-22 NOTE — Addendum Note (Signed)
Addended by: Sueanne Margarita on: 04/22/2012 12:34 PM   Modules accepted: Orders

## 2012-04-22 NOTE — Assessment & Plan Note (Signed)
Healthy Will be due for colonoscopy next year (had in 2005) Pneumovax today

## 2012-04-25 LAB — LDL CHOLESTEROL, DIRECT: Direct LDL: 138.2 mg/dL

## 2012-05-29 ENCOUNTER — Other Ambulatory Visit: Payer: Self-pay | Admitting: Internal Medicine

## 2012-08-23 ENCOUNTER — Other Ambulatory Visit: Payer: Self-pay | Admitting: Internal Medicine

## 2012-11-17 ENCOUNTER — Other Ambulatory Visit: Payer: Self-pay

## 2012-11-21 ENCOUNTER — Other Ambulatory Visit: Payer: Self-pay | Admitting: Internal Medicine

## 2012-11-28 ENCOUNTER — Ambulatory Visit (INDEPENDENT_AMBULATORY_CARE_PROVIDER_SITE_OTHER): Payer: Medicare Other | Admitting: Family Medicine

## 2012-11-28 ENCOUNTER — Encounter: Payer: Self-pay | Admitting: Family Medicine

## 2012-11-28 VITALS — BP 110/70 | HR 76 | Temp 98.2°F | Ht 62.0 in | Wt 114.8 lb

## 2012-11-28 DIAGNOSIS — Z0000 Encounter for general adult medical examination without abnormal findings: Secondary | ICD-10-CM

## 2012-11-28 DIAGNOSIS — Z23 Encounter for immunization: Secondary | ICD-10-CM

## 2012-11-28 NOTE — Progress Notes (Signed)
Pre-visit discussion using our clinic review tool. No additional management support is needed unless otherwise documented below in the visit note.  

## 2012-11-28 NOTE — Addendum Note (Signed)
Addended by: Josph Macho A on: 11/28/2012 04:48 PM   Modules accepted: Orders

## 2012-11-28 NOTE — Progress Notes (Signed)
Subjective:    Patient ID: Jacqueline Meza, female    DOB: June 20, 1945, 67 y.o.   MRN: [id number]  HPI CC: welcome to medicare visit.  Pt of Dr. Karle Starch presents today for medicare wellness visit (initial).  She did have her annual physical 04/2012 with PCP.   Passes hearing and vision screens today Denies falls, depression, anhedonia.  Seat belt use discussed. Sunscreen use discussed.  No changing moles.  Preventative: Colonoscopy - will be due 2015. Breast exam/mammogram - done at annual exam with normal mammogram 04/2012 Well woman exam - sees PCP.  S/p complete hysterectomy 1995 DEXA scan - normal 03/2009 Flu shot - today Pneumovax done 04/2012 zostavax 2013 Td 2009 Advanced directives: discussed at annual exam No living will  Request sister Jacqueline Meza as health care POA.  Requests DNR after discussion --form done 04/22/12  No tube feedings if cognitively unaware   Medications and allergies reviewed and updated in chart.  Past histories reviewed and updated if relevant as below. Patient Active Problem List   Diagnosis Date Noted  . Hyperlipidemia   . Routine general medical examination at a health care facility 04/11/2010  . MIGRAINE HEADACHE 10/27/2007   Past Medical History  Diagnosis Date  . Migraines     since childhood  . Endometriosis   . Raynaud's phenomenon   . Hyperlipidemia    Past Surgical History  Procedure Laterality Date  . Dilation and curettage of uterus      done after miscarriages twice  . Cesarean section    . Abdominal hysterectomy  01/12/1993   History  Substance Use Topics  . Smoking status: Never Smoker   . Smokeless tobacco: Never Used  . Alcohol Use: Yes     Comment: regular wine   Family History  Problem Relation Age of Onset  . Migraines Sister   . Migraines Sister   . Migraines Sister    Allergies  Allergen Reactions  . Codeine Sulfate     REACTION: headache and nausea   Current Outpatient Prescriptions on File Prior to  Visit  Medication Sig Dispense Refill  . RELPAX 40 MG tablet TAKE 1 TABLET BY MOUTH AT ONSET OF HEADACHE  8 tablet  0  . topiramate (TOPAMAX) 100 MG tablet TAKE 1 TABLET BY MOUTH EVERY DAY  30 tablet  11  . Multiple Vitamins-Minerals (ICAPS PO) Take by mouth daily.         No current facility-administered medications on file prior to visit.    Review of Systems  Constitutional: Negative for fever, chills, activity change, appetite change, fatigue and unexpected weight change.  HENT: Negative for hearing loss.   Eyes: Negative for visual disturbance.  Respiratory: Negative for cough, chest tightness, shortness of breath and wheezing.   Cardiovascular: Negative for chest pain, palpitations and leg swelling.  Gastrointestinal: Negative for nausea, vomiting, abdominal pain, diarrhea, constipation, blood in stool and abdominal distention.  Genitourinary: Negative for hematuria and difficulty urinating.  Musculoskeletal: Negative for arthralgias, myalgias and neck pain.  Skin: Negative for rash.  Neurological: Positive for headaches (migraines). Negative for dizziness, seizures and syncope.  Hematological: Negative for adenopathy. Does not bruise/bleed easily.  Psychiatric/Behavioral: Negative for dysphoric mood. The patient is not nervous/anxious.        Objective:   Physical Exam  Nursing note and vitals reviewed. Constitutional: She is oriented to person, place, and time. She appears well-developed and well-nourished. No distress.  HENT:  Head: Normocephalic and atraumatic.  Right Ear: Hearing, tympanic membrane, external  ear and ear canal normal.  Left Ear: Hearing, tympanic membrane, external ear and ear canal normal.  Nose: Nose normal.  Mouth/Throat: Oropharynx is clear and moist. No oropharyngeal exudate.  Eyes: Conjunctivae and EOM are normal. Pupils are equal, round, and reactive to light. No scleral icterus.  Neck: Normal range of motion. Neck supple. No thyromegaly present.   Cardiovascular: Normal rate, regular rhythm, normal heart sounds and intact distal pulses.   No murmur heard. Pulses:      Radial pulses are 2+ on the right side, and 2+ on the left side.  Pulmonary/Chest: Effort normal and breath sounds normal. No respiratory distress. She has no wheezes. She has no rales.  Breast exam deferred as done at annual  Abdominal: Soft. Bowel sounds are normal. She exhibits no distension and no mass. There is no tenderness. There is no rebound and no guarding.  Genitourinary:  deferred  Musculoskeletal: Normal range of motion. She exhibits no edema.  Lymphadenopathy:    She has no cervical adenopathy.  Neurological: She is alert and oriented to person, place, and time.  CN grossly intact, station and gait intact  Skin: Skin is warm and dry. No rash noted.  Psychiatric: She has a normal mood and affect. Her behavior is normal. Judgment and thought content normal.      Assessment & Plan:

## 2012-11-28 NOTE — Patient Instructions (Signed)
Flu shot today. EKG today for baseline for welcome to medicare visit. Good to see you today, call us with questions. Return at previously scheduled appointment with Dr. Alphonsus Sias.

## 2012-11-28 NOTE — Addendum Note (Signed)
Addended by: Josph Macho A on: 11/28/2012 03:48 PM   Modules accepted: Orders

## 2012-11-28 NOTE — Assessment & Plan Note (Addendum)
I have personally reviewed the Medicare Annual Wellness questionnaire and have noted 1. The patient's medical and social history 2. Their use of alcohol, tobacco or illicit drugs 3. Their current medications and supplements 4. The patient's functional ability including ADL's, fall risks, home safety risks and hearing or visual impairment. 5. Diet and physical activity 6. Evidence for depression or mood disorders The patients weight, height, BMI have been recorded in the chart.  Hearing and vision has been addressed. I have made referrals, counseling and provided education to the patient based review of the above and I have provided the pt with a written personalized care plan for preventive services. See scanned questionairre. Advanced directives previously discussed: see chart.  Reviewed preventative protocols and updated unless pt declined. Flu shot today Baseline EKG done today: NSR rate 75, normal axis, intervals, no acute ST/T changes. rsr' V1, anticipate normal variant.

## 2012-12-26 ENCOUNTER — Other Ambulatory Visit: Payer: Self-pay | Admitting: Internal Medicine

## 2012-12-27 NOTE — Telephone Encounter (Signed)
rx sent to pharmacy by e-script  

## 2012-12-27 NOTE — Telephone Encounter (Signed)
Okay #8 x 11

## 2012-12-27 NOTE — Telephone Encounter (Signed)
Ok to fill 

## 2013-04-24 ENCOUNTER — Encounter: Payer: Self-pay | Admitting: Internal Medicine

## 2013-04-24 ENCOUNTER — Ambulatory Visit (INDEPENDENT_AMBULATORY_CARE_PROVIDER_SITE_OTHER): Payer: Medicare Other | Admitting: Internal Medicine

## 2013-04-24 VITALS — BP 110/70 | HR 68 | Temp 98.6°F | Ht 62.0 in | Wt 118.0 lb

## 2013-04-24 DIAGNOSIS — E785 Hyperlipidemia, unspecified: Secondary | ICD-10-CM

## 2013-04-24 DIAGNOSIS — G43909 Migraine, unspecified, not intractable, without status migrainosus: Secondary | ICD-10-CM

## 2013-04-24 DIAGNOSIS — Z23 Encounter for immunization: Secondary | ICD-10-CM

## 2013-04-24 DIAGNOSIS — Z0000 Encounter for general adult medical examination without abnormal findings: Secondary | ICD-10-CM

## 2013-04-24 LAB — COMPREHENSIVE METABOLIC PANEL
ALT: 22 U/L (ref 0–35)
AST: 27 U/L (ref 0–37)
Albumin: 4.3 g/dL (ref 3.5–5.2)
Alkaline Phosphatase: 55 U/L (ref 39–117)
BUN: 19 mg/dL (ref 6–23)
CALCIUM: 9.7 mg/dL (ref 8.4–10.5)
CHLORIDE: 105 meq/L (ref 96–112)
CO2: 25 mEq/L (ref 19–32)
CREATININE: 0.5 mg/dL (ref 0.4–1.2)
GFR: 128.15 mL/min (ref >60.00)
Glucose, Bld: 87 mg/dL (ref 70–99)
Potassium: 4.5 mEq/L (ref 3.5–5.1)
SODIUM: 139 meq/L (ref 135–145)
TOTAL PROTEIN: 7.2 g/dL (ref 6.0–8.3)
Total Bilirubin: 0.8 mg/dL (ref 0.3–1.2)

## 2013-04-24 LAB — LIPID PANEL
CHOL/HDL RATIO: 3
Cholesterol: 234 mg/dL — ABNORMAL HIGH (ref 0–200)
HDL: 84.7 mg/dL (ref >39.00)
LDL Cholesterol: 133 mg/dL — ABNORMAL HIGH (ref 0–99)
Triglycerides: 82 mg/dL (ref 0.0–149.0)
VLDL: 16.4 mg/dL (ref 0.0–40.0)

## 2013-04-24 LAB — CBC WITH DIFFERENTIAL/PLATELET
Basophils Absolute: 0 10*3/uL (ref 0.0–0.1)
Basophils Relative: 0.5 % (ref 0.0–3.0)
EOS ABS: 0 10*3/uL (ref 0.0–0.7)
Eosinophils Relative: 0.7 % (ref 0.0–5.0)
HCT: 41.4 % (ref 36.0–46.0)
HEMOGLOBIN: 13.9 g/dL (ref 12.0–15.0)
LYMPHS PCT: 32.2 % (ref 12.0–46.0)
Lymphs Abs: 2.1 10*3/uL (ref 0.7–4.0)
MCHC: 33.6 g/dL (ref 30.0–36.0)
MCV: 108.1 fl — ABNORMAL HIGH (ref 78.0–100.0)
Monocytes Absolute: 0.5 10*3/uL (ref 0.1–1.0)
Monocytes Relative: 7.9 % (ref 3.0–12.0)
NEUTROS ABS: 3.8 10*3/uL (ref 1.4–7.7)
Neutrophils Relative %: 58.7 % (ref 43.0–77.0)
PLATELETS: 224 10*3/uL (ref 150.0–400.0)
RBC: 3.83 Mil/uL — ABNORMAL LOW (ref 3.87–5.11)
RDW: 13.6 % (ref 11.5–14.6)
WBC: 6.5 10*3/uL (ref 4.5–10.5)

## 2013-04-24 LAB — T4, FREE: Free T4: 0.69 ng/dL (ref 0.60–1.60)

## 2013-04-24 LAB — TSH: TSH: 2.83 u[IU]/mL (ref 0.35–5.50)

## 2013-04-24 NOTE — Addendum Note (Signed)
Addended by: Despina Hidden on: 04/24/2013 05:14 PM   Modules accepted: Orders

## 2013-04-24 NOTE — Assessment & Plan Note (Signed)
Prefers no statin for primary prevention but will recheck levels

## 2013-04-24 NOTE — Assessment & Plan Note (Signed)
Satisfied with her current meds

## 2013-04-24 NOTE — Progress Notes (Signed)
Subjective:    Patient ID: Jacqueline Meza, female    DOB: 02-26-1945, 68 y.o.   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 cyanocobalamin 500 MCG tablet Take 500 mcg by mouth daily.      . Multiple Vitamins-Minerals (ICAPS PO) Take by mouth daily.        . RELPAX 40 MG tablet TAKE 1 TABLET BY MOUTH AT ONSET OF HEADACHE  8 tablet  11  . topiramate (TOPAMAX) 100 MG tablet TAKE 1 TABLET BY MOUTH EVERY DAY  30 tablet  11   No current facility-administered medications on file prior to visit.    Allergies  Allergen Reactions  . Codeine Sulfate     REACTION: headache and nausea    Past Medical History  Diagnosis Date  . Migraines     since childhood  . Endometriosis   . Raynaud's phenomenon   . Hyperlipidemia     Past Surgical History  Procedure Laterality Date  . Dilation and curettage of uterus      done after miscarriages twice  . Cesarean section    . Abdominal hysterectomy  01/12/1993    endometriosis and worsening migraines    Family History  Problem Relation Age of Onset  . Migraines Sister   . Migraines Daughter   . Cancer Father     lymphoma  . Cancer Paternal Uncle     colon  . Cancer Paternal Aunt     breast  . CAD Neg Hx   . Stroke Neg Hx   . Diabetes Neg Hx     History   Social History  . Marital Status: Divorced    Spouse Name: N/A    Number of Children: 1  . Years of Education: N/A   Occupational History  . Retired--curriculum facilitator     [place] History Main Topics  . Smoking status: Never Smoker   . Smokeless tobacco: Never Used  . Alcohol Use: Yes     Comment: regular wine  . Drug Use: No  . Sexual Activity: Not on file   Other Topics  Concern  . Not on file   Social History Narrative   No living will   Request sister Holley Raring as health care POA.   Requests DNR after discussion --form done 04/22/12   No tube feedings if cognitively unaware   Review of Systems  Constitutional: Negative for fatigue and unexpected weight change.       Wears seat belt  HENT: Negative for dental problem, hearing loss and tinnitus.        Regular with dentist  Eyes: Negative for visual disturbance.       No diplopia or unilateral vision loss  Respiratory: Negative for cough, chest tightness and shortness of breath.   Cardiovascular: Negative for chest pain, palpitations and leg swelling.  Gastrointestinal: Negative for nausea, vomiting, abdominal pain, constipation and blood in stool.       No heartburn  Endocrine: Negative for cold intolerance and heat intolerance.  Genitourinary: Negative for dysuria, difficulty urinating and dyspareunia.  Musculoskeletal: Positive for back pain. Negative for arthralgias and joint swelling.       Some back pain with the house cleaning  Skin: Negative for rash.  No suspicious lesions Sees Dr Nehemiah Massed yearly  Allergic/Immunologic: Negative for environmental allergies and immunocompromised state.  Neurological: Positive for headaches. Negative for dizziness, syncope, weakness, light-headedness and numbness.       Migraines more frequent in pollen season  Hematological: Negative for adenopathy. Does not bruise/bleed easily.  Psychiatric/Behavioral: Positive for sleep disturbance. Negative for dysphoric mood. The patient is not nervous/anxious.        Chronic sleep problems---not tired in daytime though       Objective:   Physical Exam  Constitutional: She is oriented to person, place, and time. She appears well-developed and well-nourished. No distress.  HENT:  Head: Normocephalic and atraumatic.  Right Ear: External ear normal.  Left Ear: External ear normal.  Mouth/Throat: Oropharynx is clear  and moist. No oropharyngeal exudate.  Eyes: Conjunctivae and EOM are normal. Pupils are equal, round, and reactive to light.  Neck: Normal range of motion. Neck supple. No thyromegaly present.  Cardiovascular: Normal rate, regular rhythm, normal heart sounds and intact distal pulses.  Exam reveals no gallop.   No murmur heard. Pulmonary/Chest: Effort normal and breath sounds normal. No respiratory distress. She has no wheezes. She has no rales.  Abdominal: Soft. There is no tenderness.  Genitourinary:  Mild periareolar cystic changes bilaterally No masses  Musculoskeletal: She exhibits no edema and no tenderness.  Lymphadenopathy:    She has no cervical adenopathy.    She has no axillary adenopathy.  Neurological: She is alert and oriented to person, place, and time.  Skin: No rash noted. No erythema.  Psychiatric: She has a normal mood and affect. Her behavior is normal.          Assessment & Plan:

## 2013-04-24 NOTE — Patient Instructions (Signed)
Please call Dr Bary Castilla about getting your repeat colonscopy.

## 2013-04-24 NOTE — Assessment & Plan Note (Signed)
Healthy Will give prevnar She will call about her repeat colonoscopy

## 2013-04-24 NOTE — Progress Notes (Signed)
Pre visit review using our clinic review tool, if applicable. No additional management support is needed unless otherwise documented below in the visit note. 

## 2013-05-01 ENCOUNTER — Encounter: Payer: Self-pay | Admitting: General Surgery

## 2013-05-24 ENCOUNTER — Ambulatory Visit (INDEPENDENT_AMBULATORY_CARE_PROVIDER_SITE_OTHER): Payer: Medicare Other | Admitting: General Surgery

## 2013-05-24 ENCOUNTER — Encounter: Payer: Self-pay | Admitting: General Surgery

## 2013-05-24 VITALS — BP 100/70 | HR 64 | Resp 12 | Ht 63.0 in | Wt 119.0 lb

## 2013-05-24 DIAGNOSIS — Z1211 Encounter for screening for malignant neoplasm of colon: Secondary | ICD-10-CM

## 2013-05-24 MED ORDER — POLYETHYLENE GLYCOL 3350 17 GM/SCOOP PO POWD: 1.0000 | Freq: Once | ORAL | Status: DC

## 2013-05-24 NOTE — Progress Notes (Signed)
Patient ID: Jacqueline Meza, female   DOB: September 14, 1945, 68 y.o.   MRN: [id number]  Chief Complaint  Patient presents with  . Colonoscopy    HPI Jacqueline Meza is a 68 y.o. female.  Here today to discuss having a colonoscopy. Her last colonoscopy was 2005. She denies any gastrointestinal issues at this time.   HPI  Past Medical History  Diagnosis Date  . Migraines     since childhood  . Endometriosis   . Raynaud's phenomenon   . Hyperlipidemia     Past Surgical History  Procedure Laterality Date  . Dilation and curettage of uterus      done after miscarriages twice  . Cesarean section  1988  . Abdominal hysterectomy  01/12/1993    endometriosis and worsening migraines  . Colonoscopy  2005    Dr Bary Castilla    Family History  Problem Relation Age of Onset  . Migraines Sister   . Migraines Daughter   . Cancer Father     lymphoma  . Cancer Paternal Uncle 64    colon  . Cancer Paternal Aunt     breast  . CAD Neg Hx   . Stroke Neg Hx   . Diabetes Neg Hx     Social History History  Substance Use Topics  . Smoking status: Never Smoker   . Smokeless tobacco: Never Used  . Alcohol Use: Yes     Comment: regular wine/daily    Allergies  Allergen Reactions  . Codeine Sulfate     REACTION: headache and nausea    Current Outpatient Prescriptions  Medication Sig Dispense Refill  . Calcium-Magnesium-Vitamin D (CALCIUM 1200+D3 PO) Take 1 tablet by mouth daily.      . RELPAX 40 MG tablet TAKE 1 TABLET BY MOUTH AT ONSET OF HEADACHE  8 tablet  11  . topiramate (TOPAMAX) 100 MG tablet TAKE 1 TABLET BY MOUTH EVERY DAY  30 tablet  11  . vitamin B-12 (CYANOCOBALAMIN) 1000 MCG tablet Take 2,500 mcg by mouth daily.      . polyethylene glycol powder (GLYCOLAX/MIRALAX) powder Take 255 g (1 Container total) by mouth once.  255 g  0   No current facility-administered medications for this visit.    Review of Systems Review of Systems  Constitutional: Negative.   Respiratory:  Negative.   Cardiovascular: Negative.   Gastrointestinal: Negative.     Blood pressure 100/70, pulse 64, resp. rate 12, height 5\' 3"  (1.6 m), weight 119 lb (53.978 kg).  Physical Exam Physical Exam  Constitutional: She is oriented to person, place, and time. She appears well-developed and well-nourished.  Neck: Neck supple. No thyromegaly present.  Cardiovascular: Normal rate, regular rhythm and normal heart sounds.   No murmur heard. Pulmonary/Chest: Effort normal and breath sounds normal.  Abdominal: Soft. Normal appearance and bowel sounds are normal. There is no hepatosplenomegaly. There is no tenderness. No hernia.  Lymphadenopathy:    She has no cervical adenopathy.  Neurological: She is alert and oriented to person, place, and time.  Skin: Skin is warm and dry.    Data Reviewed Colonoscopy completed 04/12/2003 was unremarkable.  The patient's 2011 bone density test was normal. A 2 year followup had been recommended at that time.  Assessment    Candidate for screening colonoscopy.    Plan    The procedure was reviewed, and risks (bleeding/perforation) as well as benefits (early detection of cancer) were reviewed.  The patient mentioned that she was overdue for bone density testing  and this will be arranged.   Patient has been scheduled for a colonoscopy at [facility] on 06/06/13. She is aware to pre register with the hospital at least 2 buisness days prior. Patient is aware of date and all instructions. Miralax prescription sent into pharmacy.   Patient is scheduled for a Bone Density scan at [facility] on 06/08/13 at 2:30 pm.  PCP: Darrin Luis Byrnett 05/25/2013, 2:37 PM

## 2013-05-24 NOTE — Patient Instructions (Addendum)
Patient to be scheduled for screening colonoscopy. The patient is aware to call back for any questions or concerns.  Colonoscopy A colonoscopy is an exam to look at the entire large intestine (colon). This exam can help find problems such as tumors, polyps, inflammation, and areas of bleeding. The exam takes about 1 hour.  LET [facility] CARE PROVIDER KNOW ABOUT:   Any allergies you have.  All medicines you are taking, including vitamins, herbs, eye drops, creams, and over-the-counter medicines.  Previous problems you or members of your family have had with the use of anesthetics.  Any blood disorders you have.  Previous surgeries you have had.  Medical conditions you have. RISKS AND COMPLICATIONS  Generally, this is a safe procedure. However, as with any procedure, complications can occur. Possible complications include:  Bleeding.  Tearing or rupture of the colon wall.  Reaction to medicines given during the exam.  Infection (rare). BEFORE THE PROCEDURE   Ask your health care provider about changing or stopping your regular medicines.  You may be prescribed an oral bowel prep. This involves drinking a large amount of medicated liquid, starting the day before your procedure. The liquid will cause you to have multiple loose stools until your stool is almost clear or light green. This cleans out your colon in preparation for the procedure.  Do not eat or drink anything else once you have started the bowel prep, unless your health care provider tells you it is safe to do so.  Arrange for someone to drive you home after the procedure. PROCEDURE   You will be given medicine to help you relax (sedative).  You will lie on your side with your knees bent.  A long, flexible tube with a light and camera on the end (colonoscope) will be inserted through the rectum and into the colon. The camera sends video back to a computer screen as it moves through the colon. The colonoscope also  releases carbon dioxide gas to inflate the colon. This helps your health care provider see the area better.  During the exam, your health care provider may take a small tissue sample (biopsy) to be examined under a microscope if any abnormalities are found.  The exam is finished when the entire colon has been viewed. AFTER THE PROCEDURE   Do not drive for 24 hours after the exam.  You may have a small amount of blood in your stool.  You may pass moderate amounts of gas and have mild abdominal cramping or bloating. This is caused by the gas used to inflate your colon during the exam.  Ask when your test results will be ready and how you will get your results. Make sure you get your test results. Document Released: 12/27/1999 Document Revised: 10/19/2012 Document Reviewed: 09/05/2012 [facility] Patient Information 2014 [place].  Patient has been scheduled for a colonoscopy at [facility] on 06/06/13. She is aware to pre register with the hospital at least 2 buisness days prior. Patient is aware of date and all instructions. Miralax prescription sent into pharmacy.    Patient is scheduled for a Bone Density scan at [facility] on 06/08/13 at 2:30 pm.

## 2013-05-25 ENCOUNTER — Other Ambulatory Visit: Payer: Self-pay | Admitting: General Surgery

## 2013-05-25 DIAGNOSIS — Z1211 Encounter for screening for malignant neoplasm of colon: Secondary | ICD-10-CM | POA: Insufficient documentation

## 2013-06-06 ENCOUNTER — Ambulatory Visit: Payer: Self-pay | Admitting: General Surgery

## 2013-06-06 DIAGNOSIS — Z1211 Encounter for screening for malignant neoplasm of colon: Secondary | ICD-10-CM

## 2013-06-07 ENCOUNTER — Encounter: Payer: Self-pay | Admitting: General Surgery

## 2013-06-09 ENCOUNTER — Encounter: Payer: Self-pay | Admitting: General Surgery

## 2013-06-16 ENCOUNTER — Telehealth: Payer: Self-pay | Admitting: General Surgery

## 2013-06-16 NOTE — Telephone Encounter (Signed)
Thanks for the info.

## 2013-06-16 NOTE — Telephone Encounter (Signed)
Bone density results reviewed. Osteopenia noted in the spine and femoral neck. Otherwise normal. Patient will continue her present vitamin D and calcium supplements. Copy will be forwarded to Dr. Silvio Pate for his review.

## 2013-06-27 ENCOUNTER — Encounter: Payer: Self-pay | Admitting: General Surgery

## 2013-08-29 ENCOUNTER — Other Ambulatory Visit: Payer: Self-pay | Admitting: Internal Medicine

## 2013-11-13 ENCOUNTER — Encounter: Payer: Self-pay | Admitting: General Surgery

## 2014-01-09 ENCOUNTER — Other Ambulatory Visit: Payer: Self-pay | Admitting: Internal Medicine

## 2014-02-06 ENCOUNTER — Telehealth: Payer: Self-pay | Admitting: Internal Medicine

## 2014-02-06 NOTE — Telephone Encounter (Signed)
I notified patient form is ready to be picked up.

## 2014-02-06 NOTE — Telephone Encounter (Signed)
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 Put inbox.

## 2014-02-06 NOTE — Telephone Encounter (Signed)
Form done No charge 

## 2014-02-15 ENCOUNTER — Other Ambulatory Visit: Payer: Self-pay | Admitting: Internal Medicine

## 2014-02-15 NOTE — Telephone Encounter (Signed)
Approved: #[id number] x 11 

## 2014-02-15 NOTE — Telephone Encounter (Signed)
Ok to refill? Last prescribed on 01/09/14. Last seen on 04/24/13. Next appt. 06/04/14.

## 2014-04-25 ENCOUNTER — Encounter: Payer: Medicare Other | Admitting: Internal Medicine

## 2014-06-04 ENCOUNTER — Other Ambulatory Visit: Payer: Self-pay | Admitting: Internal Medicine

## 2014-06-04 ENCOUNTER — Ambulatory Visit (INDEPENDENT_AMBULATORY_CARE_PROVIDER_SITE_OTHER): Payer: Medicare Other | Admitting: Internal Medicine

## 2014-06-04 ENCOUNTER — Encounter: Payer: Self-pay | Admitting: Internal Medicine

## 2014-06-04 VITALS — BP 100/60 | HR 68 | Temp 98.4°F | Ht 63.0 in | Wt 119.0 lb

## 2014-06-04 DIAGNOSIS — Z1231 Encounter for screening mammogram for malignant neoplasm of breast: Secondary | ICD-10-CM

## 2014-06-04 DIAGNOSIS — Z0000 Encounter for general adult medical examination without abnormal findings: Secondary | ICD-10-CM | POA: Diagnosis not present

## 2014-06-04 DIAGNOSIS — G43009 Migraine without aura, not intractable, without status migrainosus: Secondary | ICD-10-CM

## 2014-06-04 DIAGNOSIS — E785 Hyperlipidemia, unspecified: Secondary | ICD-10-CM

## 2014-06-04 DIAGNOSIS — Z7189 Other specified counseling: Secondary | ICD-10-CM | POA: Insufficient documentation

## 2014-06-04 NOTE — Assessment & Plan Note (Signed)
Fair control on current regimen

## 2014-06-04 NOTE — Progress Notes (Signed)
Subjective:    Patient ID: Jacqueline Meza, female    DOB: [birth year], 69 y.o.   MRN: [id number]  HPI Here for Medicare wellness and follow up of chronic medical problems Reviewed form and advanced directives Reviewed other physicians Usually has 3-4 glasses of wine a day---no worrisome features No tobacco  Walks regularly Vision and hearing are fine Independent with instrumental ADLs No falls No depression or anhedonia No apparent cognitive problems  Migraines mostly controlled Seems to be triggered by the weather topiramate prophyaxis Tends to need more relpax in February and November Then rare some other months  Now back to teaching 3 days per [facility] She is enjoying that Cleans houses part time also  Current Outpatient Prescriptions on File Prior to Visit  Medication Sig Dispense Refill  . Calcium-Magnesium-Vitamin D (CALCIUM 1200+D3 PO) Take 1 tablet by mouth daily.    . RELPAX 40 MG tablet TAKE 1 TABLET BY MOUTH AT ONSET OF HEADACHE 8 tablet 11  . topiramate (TOPAMAX) 100 MG tablet TAKE 1 TABLET BY MOUTH EVERY DAY 30 tablet 11  . vitamin B-12 (CYANOCOBALAMIN) 1000 MCG tablet Take 2,500 mcg by mouth daily.     No current facility-administered medications on file prior to visit.    Allergies  Allergen Reactions  . Codeine Sulfate     REACTION: headache and nausea    Past Medical History  Diagnosis Date  . Migraines     since childhood  . Endometriosis   . Raynaud's phenomenon   . Hyperlipidemia     Past Surgical History  Procedure Laterality Date  . Dilation and curettage of uterus      done after miscarriages twice  . Cesarean section  1988  . Abdominal hysterectomy  01/12/1993    endometriosis and worsening migraines  . Colonoscopy  2005    Dr Bary Castilla    Family History  Problem Relation Age of Onset  . Migraines Sister   . Migraines Daughter   . Cancer Father     lymphoma  . Cancer Paternal Uncle 7    colon  . Cancer  Paternal Aunt     breast  . CAD Neg Hx   . Stroke Neg Hx   . Diabetes Neg Hx     History   Social History  . Marital Status: Divorced    Spouse Name: N/A  . Number of Children: 1  . Years of Education: N/A   Occupational History  . Retired--curriculum facilitator     [place]  . Teaching part time--math intervention   . Cleans houses part time    Social History Main Topics  . Smoking status: Never Smoker   . Smokeless tobacco: Never Used  . Alcohol Use: Yes     Comment: regular wine/daily  . Drug Use: No  . Sexual Activity: Not on file   Other Topics Concern  . Not on file   Social History Narrative   No living will   Request sister Jacqueline Meza as health care POA.   Requests DNR after discussion --form done 04/22/12   No tube feedings if cognitively unaware   Review of Systems Weight is stable Sleeps okay Wears seat belt No chest pain or palpitations No SOB No dizziness or syncope Bowels are okay No urinary problems or incontinence Some back pain--not bad. Uses aleve prn No other joint pains    Objective:   Physical Exam  Constitutional: She is oriented to person, place, and time. She appears well-developed  and well-nourished. No distress.  HENT:  Mouth/Throat: Oropharynx is clear and moist. No oropharyngeal exudate.  Neck: Normal range of motion. Neck supple. No thyromegaly present.  Cardiovascular: Normal rate, regular rhythm and normal heart sounds.  Exam reveals no gallop.   No murmur heard. Pulmonary/Chest: Effort normal and breath sounds normal. No respiratory distress. She has no wheezes. She has no rales.  Abdominal: Soft. There is no tenderness.  Musculoskeletal: She exhibits no edema or tenderness.  Lymphadenopathy:    She has no cervical adenopathy.  Neurological: She is alert and oriented to person, place, and time.  President-- "Obama, Clinton, ---then [id number] Recall 2/3  Skin: No rash noted. No  erythema.  Psychiatric: She has a normal mood and affect. Her behavior is normal.          Assessment & Plan:

## 2014-06-04 NOTE — Assessment & Plan Note (Signed)
See social history Plans to do POA Has DNR

## 2014-06-04 NOTE — Patient Instructions (Signed)
Please set up your screening mammogram. 

## 2014-06-04 NOTE — Assessment & Plan Note (Signed)
Low risk with high HDL No primary prevention with statin after discussion

## 2014-06-04 NOTE — Progress Notes (Signed)
Pre visit review using our clinic review tool, if applicable. No additional management support is needed unless otherwise documented below in the visit note. 

## 2014-06-04 NOTE — Assessment & Plan Note (Signed)
I have personally reviewed the Medicare Annual Wellness questionnaire and have noted 1. The patient's medical and social history 2. Their use of alcohol, tobacco or illicit drugs 3. Their current medications and supplements 4. The patient's functional ability including ADL's, fall risks, home safety risks and hearing or visual             impairment. 5. Diet and physical activities 6. Evidence for depression or mood disorders  The patients weight, height, BMI and visual acuity have been recorded in the chart I have made referrals, counseling and provided education to the patient based review of the above and I have provided the pt with a written personalized care plan for preventive services.  I have provided you with a copy of your personalized plan for preventive services. Please take the time to review along with your updated medication list.  Healthy Yearly flu shots--other imms are UTD Due for screening mammogram. Discussed breast exam--will defer Colonoscopy due 2025 No pap smears due to age

## 2014-06-14 ENCOUNTER — Other Ambulatory Visit: Payer: Self-pay | Admitting: Internal Medicine

## 2014-06-14 ENCOUNTER — Ambulatory Visit
Admission: RE | Admit: 2014-06-14 | Discharge: 2014-06-14 | Disposition: A | Discharge status: Home / Self Care | Payer: Medicare Other | Source: Ambulatory Visit | Attending: Internal Medicine | Admitting: Internal Medicine

## 2014-06-14 DIAGNOSIS — Z1231 Encounter for screening mammogram for malignant neoplasm of breast: Secondary | ICD-10-CM | POA: Diagnosis present

## 2014-06-14 DIAGNOSIS — R922 Inconclusive mammogram: Secondary | ICD-10-CM | POA: Insufficient documentation

## 2014-08-15 ENCOUNTER — Other Ambulatory Visit: Payer: Self-pay | Admitting: Internal Medicine

## 2015-02-05 ENCOUNTER — Telehealth: Payer: Self-pay

## 2015-02-05 NOTE — Telephone Encounter (Signed)
Please do prior authorization or whatever they require

## 2015-02-05 NOTE — Telephone Encounter (Signed)
Pt left v/m; pt received letter and ins will no longer cover relpax for migraine h/a; 2 options to try Rizatriptan, sumatriptan or naratriptan or request exception to the plan. Pt thinks she has tried these 3 meds and they were not effective. Pt request exception to plan request. Pt request cb ASAP. Pt has 3 pills left.

## 2015-02-06 NOTE — Telephone Encounter (Signed)
Pt left v/m requesting cb about status of exception of relpax; call [phone] for exception to be done. Pt request cb.

## 2015-02-06 NOTE — Telephone Encounter (Signed)
Spoke with patient and advised this may take a few days, pt states the other meds just didn't touch the pain and that she's been on replax for 20 years. I advised that I will send for prior auth and will call her to let her know what's been done.  Sent to [facility]

## 2015-02-07 NOTE — Telephone Encounter (Addendum)
Pt left v/m; pt received a call that the exception request for relpax was denied; pt wants Dr Silvio Pate to suggest another med for migraines; pt has 4 more relpax; pt request med to [facility] [address]  Years ago pt tried imitrex and does not know if that would work now or not. Pt request cb.

## 2015-02-07 NOTE — Telephone Encounter (Signed)
Please call the pharmacist Change to rizatriptan at the recommended doses per pharmacist and give 1 year Rx

## 2015-02-08 MED ORDER — RIZATRIPTAN BENZOATE 10 MG PO TABS: 10.0000 mg | ORAL_TABLET | ORAL | Status: DC | PRN

## 2015-02-08 NOTE — Telephone Encounter (Signed)
Spoke with patient and advised results rx sent to pharmacy by e-script  

## 2015-02-08 NOTE — Addendum Note (Signed)
Addended by: Despina Hidden on: 02/08/2015 04:44 PM   Modules accepted: Orders

## 2015-02-28 ENCOUNTER — Other Ambulatory Visit: Payer: Self-pay

## 2015-02-28 MED ORDER — RIZATRIPTAN BENZOATE 10 MG PO TABS: 10.0000 mg | ORAL_TABLET | ORAL | Status: DC | PRN

## 2015-02-28 NOTE — Telephone Encounter (Signed)
rx sent to pharmacy by e-script  

## 2015-02-28 NOTE — Telephone Encounter (Signed)
Pt left v/m; rizatriptan is working well for h/a and pt has 2 tabs left but request refill to [facility] [address]. Pt request cb when refilled.Please advise. Last annual 06/04/14.

## 2015-02-28 NOTE — Telephone Encounter (Signed)
Approved: #[id number] x 11 

## 2015-06-14 ENCOUNTER — Encounter: Payer: Medicare Other | Admitting: Internal Medicine

## 2015-06-24 ENCOUNTER — Ambulatory Visit: Payer: Medicare Other

## 2015-07-01 ENCOUNTER — Ambulatory Visit (INDEPENDENT_AMBULATORY_CARE_PROVIDER_SITE_OTHER): Payer: Medicare Other

## 2015-07-01 VITALS — BP 98/70 | HR 76 | Temp 97.9°F | Ht 61.5 in | Wt 124.0 lb

## 2015-07-01 DIAGNOSIS — Z1159 Encounter for screening for other viral diseases: Secondary | ICD-10-CM

## 2015-07-01 DIAGNOSIS — Z0000 Encounter for general adult medical examination without abnormal findings: Secondary | ICD-10-CM

## 2015-07-01 NOTE — Progress Notes (Signed)
Pre visit review using our clinic review tool, if applicable. No additional management support is needed unless otherwise documented below in the visit note. 

## 2015-07-01 NOTE — Progress Notes (Signed)
Subjective:   Jacqueline Meza is a 70 y.o. female who presents for Medicare Annual (Subsequent) preventive examination.  Review of Systems:  N/A Cardiac Risk Factors include: advanced age (>77men, >5 women);dyslipidemia     Objective:     Vitals: BP 98/70 mmHg  Pulse 76  Temp(Src) 97.9 F (36.6 C) (Oral)  Ht 5' 1.5" (1.562 m)  Wt 124 lb (56.246 kg)  BMI 23.05 kg/m2  SpO2 98%  Body mass index is 23.05 kg/(m^2).   Tobacco History  Smoking status  . Never Smoker   Smokeless tobacco  . Never Used     Counseling given: No   Past Medical History  Diagnosis Date  . Migraines     since childhood  . Endometriosis   . Raynaud's phenomenon   . Hyperlipidemia    Past Surgical History  Procedure Laterality Date  . Dilation and curettage of uterus      done after miscarriages twice  . Cesarean section  1988  . Abdominal hysterectomy  01/12/1993    endometriosis and worsening migraines  . Colonoscopy  2005    Dr Bary Castilla   Family History  Problem Relation Age of Onset  . Migraines Sister   . Migraines Daughter   . Cancer Father     lymphoma  . Cancer Paternal Uncle 28    colon  . Cancer Paternal Aunt     breast  . CAD Neg Hx   . Stroke Neg Hx   . Diabetes Neg Hx    History  Sexual Activity  . Sexual Activity: No    Outpatient Encounter Prescriptions as of 07/01/2015  Medication Sig  . Calcium-Magnesium-Vitamin D (CALCIUM 1200+D3 PO) Take 1 tablet by mouth daily.  . rizatriptan (MAXALT) 10 MG tablet Take 1 tablet (10 mg total) by mouth as needed for migraine. May repeat in 2 hours if needed  . topiramate (TOPAMAX) 100 MG tablet TAKE 1 TABLET BY MOUTH EVERY DAY  . vitamin B-12 (CYANOCOBALAMIN) 1000 MCG tablet Take 2,500 mcg by mouth daily.  . [DISCONTINUED] RELPAX 40 MG tablet TAKE 1 TABLET BY MOUTH AT ONSET OF HEADACHE   No facility-administered encounter medications on file as of 07/01/2015.    Activities of Daily Living In your present state of  health, do you have any difficulty performing the following activities: 07/01/2015  Hearing? N  Vision? N  Difficulty concentrating or making decisions? N  Walking or climbing stairs? N  Dressing or bathing? N  Doing errands, shopping? N  Preparing Food and eating ? N  Using the Toilet? N  In the past six months, have you accidently leaked urine? N  Do you have problems with loss of bowel control? N  Managing your Medications? N  Managing your Finances? N  Housekeeping or managing your Housekeeping? N    Patient Care Team: Venia Carbon, MD as PCP - General Robert Bellow, MD (General Surgery) Karsten Fells, DDS as Consulting Physician (Dentistry) Eula Flax, OD as Consulting Physician (Optometry)    Assessment:     Hearing Screening   125Hz  250Hz  500Hz  1000Hz  2000Hz  4000Hz  8000Hz   Right ear:   40 0 40 40   Left ear:   40 0 40 40   Vision Screening Comments: Last eye exam in Jan 2017 with Dr. Glennon Mac   Exercise Activities and Dietary recommendations Current Exercise Habits: The patient does not participate in regular exercise at present (pt is caring for twin grandchildren FT and is unable to  walk  regularly at this time), Exercise limited by: None identified  Goals    . Increase water intake     Starting 07/01/2015, I will continue to drink at least 8 glasses of water daily.       Fall Risk Fall Risk  07/01/2015 06/04/2014 11/28/2012  Falls in the past year? No No No   Depression Screen PHQ 2/9 Scores 07/01/2015 06/04/2014 11/28/2012  PHQ - 2 Score 0 0 0     Cognitive Testing MMSE - Mini Mental State Exam 07/01/2015  Orientation to time 5  Orientation to Place 5  Registration 3  Attention/ Calculation 0  Recall 3  Language- name 2 objects 0  Language- repeat 1  Language- follow 3 step command 3  Language- read & follow direction 0  Write a sentence 0  Copy design 0  Total score 20   PLEASE NOTE: A Mini-Cog screen was completed. Maximum score is 20. A  value of 0 denotes this part of Folstein MMSE was not completed or the patient failed this part of the Mini-Cog screening.   Mini-Cog Screening Orientation to Time - Max 5 pts Orientation to Place - Max 5 pts Registration - Max 3 pts Recall - Max 3 pts Language Repeat - Max 1 pts Language Follow 3 Step Command - Max 3 pts  Immunization History  Administered Date(s) Administered  . Influenza Whole 10/27/2007  . Influenza,inj,Quad PF,36+ Mos 11/28/2012  . Influenza-Unspecified 10/12/2013  . Pneumococcal Conjugate-13 04/24/2013  . Pneumococcal Polysaccharide-23 04/22/2012  . Td 10/27/2007  . Zoster 01/08/2012   Screening Tests Health Maintenance  Topic Date Due  . INFLUENZA VACCINE  08/13/2015  . MAMMOGRAM  06/13/2016  . TETANUS/TDAP  10/26/2017  . COLONOSCOPY  06/07/2023  . DEXA SCAN  Completed  . ZOSTAVAX  Completed  . Hepatitis C Screening  Completed  . PNA vac Low Risk Adult  Completed      Plan:     I have personally reviewed and addressed the Medicare Annual Wellness questionnaire and have noted the following in the patient's chart:  A. Medical and social history B. Use of alcohol, tobacco or illicit drugs  C. Current medications and supplements D. Functional ability and status E.  Nutritional status F.  Physical activity G. Advance directives H. List of other physicians I.  Hospitalizations, surgeries, and ER visits in previous 12 months J.  [place] to include hearing, vision, cognitive, depression L. Referrals and appointments - none  In addition, I have reviewed and discussed with patient certain preventive protocols, quality metrics, and best practice recommendations. A written personalized care plan for preventive services as well as general preventive health recommendations were provided to patient.  See attached scanned questionnaire for additional information.   Signed,   Lindell Noe, MHA, BS, LPN Health Advisor

## 2015-07-01 NOTE — Patient Instructions (Signed)
Jacqueline Meza , Thank you for taking time to come for your Medicare Wellness Visit. I appreciate your ongoing commitment to your health goals. Please review the following plan we discussed and let me know if I can assist you in the future.   These are the goals we discussed: Goals    . Increase water intake     Starting 07/01/2015, I will continue to drink at least 8 glasses of water daily.        This is a list of the screening recommended for you and due dates:  Health Maintenance  Topic Date Due  . Flu Shot  08/13/2015  . Mammogram  06/13/2016  . Tetanus Vaccine  10/26/2017  . Colon Cancer Screening  06/07/2023  . DEXA scan (bone density measurement)  Completed  . Shingles Vaccine  Completed  .  Hepatitis C: One time screening is recommended by Center for Disease Control  (CDC) for  adults born from [birth year] through 1963.   Completed  . Pneumonia vaccines  Completed    Preventive Care for Adults  A healthy lifestyle and preventive care can promote health and wellness. Preventive health guidelines for adults include the following key practices.  . A routine yearly physical is a good way to check with your health care provider about your health and preventive screening. It is a chance to share any concerns and updates on your health and to receive a thorough exam.  . Visit your dentist for a routine exam and preventive care every 6 months. Brush your teeth twice a day and floss once a day. Good oral hygiene prevents tooth decay and gum disease.  . The frequency of eye exams is based on your age, health, family medical history, use  of contact lenses, and other factors. Follow your health care provider's ecommendations for frequency of eye exams.  . Eat a healthy diet. Foods like vegetables, fruits, whole grains, low-fat dairy products, and lean protein foods contain the nutrients you need without too many calories. Decrease your intake of foods high in solid fats, added sugars, and  salt. Eat the right amount of calories for you. Get information about a proper diet from your health care provider, if necessary.  . Regular physical exercise is one of the most important things you can do for your health. Most adults should get at least 150 minutes of moderate-intensity exercise (any activity that increases your heart rate and causes you to sweat) each week. In addition, most adults need muscle-strengthening exercises on 2 or more days a week.  Silver Sneakers may be a benefit available to you. To determine eligibility, you may visit the website: www.silversneakers.com or contact program at 579-293-2051 Mon-Fri between 8AM-8PM.   . Maintain a healthy weight. The body mass index (BMI) is a screening tool to identify possible weight problems. It provides an estimate of body fat based on height and weight. Your health care provider can find your BMI and can help you achieve or maintain a healthy weight.   For adults 20 years and older: ? A BMI below 18.5 is considered underweight. ? A BMI of 18.5 to 24.9 is normal. ? A BMI of 25 to 29.9 is considered overweight. ? A BMI of 30 and above is considered obese.   . Maintain normal blood lipids and cholesterol levels by exercising and minimizing your intake of saturated fat. Eat a balanced diet with plenty of fruit and vegetables. Blood tests for lipids and cholesterol should begin at age  20 and be repeated every 5 years. If your lipid or cholesterol levels are high, you are over 50, or you are at high risk for heart disease, you may need your cholesterol levels checked more frequently. Ongoing high lipid and cholesterol levels should be treated with medicines if diet and exercise are not working.  . If you smoke, find out from your health care provider how to quit. If you do not use tobacco, please do not start.  . If you choose to drink alcohol, please do not consume more than 2 drinks per day. One drink is considered to be 12 ounces  (355 mL) of beer, 5 ounces (148 mL) of wine, or 1.5 ounces (44 mL) of liquor.  . If you are 63-37 years old, ask your health care provider if you should take aspirin to prevent strokes.  . Use sunscreen. Apply sunscreen liberally and repeatedly throughout the day. You should seek shade when your shadow is shorter than you. Protect yourself by wearing long sleeves, pants, a wide-brimmed hat, and sunglasses year round, whenever you are outdoors.  . Once a month, do a whole body skin exam, using a mirror to look at the skin on your back. Tell your health care provider of new moles, moles that have irregular borders, moles that are larger than a pencil eraser, or moles that have changed in shape or color.

## 2015-07-01 NOTE — Progress Notes (Signed)
PCP notes:  Health maintenance:   Hep C screening - completed  Abnormal screenings:  Hearing - failed  Patient concerns: Pt wants to know if there is any need to be concerned about hearing screening.   Nurse concerns: None  Next PCP appt: None-pt stated she was doing fine at this time.

## 2015-07-01 NOTE — Progress Notes (Signed)
   Subjective:    Patient ID: Jacqueline Meza, female    DOB: 01-Mar-1945, 70 y.o.   MRN: [id number]  HPI  I reviewed health advisor's note, was available for consultation, and agree with documentation and plan.   Review of Systems     Objective:   Physical Exam        Assessment & Plan:

## 2015-07-02 LAB — HEPATITIS C ANTIBODY: HCV AB: NEGATIVE

## 2015-08-25 ENCOUNTER — Other Ambulatory Visit: Payer: Self-pay | Admitting: Internal Medicine

## 2015-11-29 ENCOUNTER — Encounter: Payer: Self-pay | Admitting: Internal Medicine

## 2015-11-29 ENCOUNTER — Ambulatory Visit (INDEPENDENT_AMBULATORY_CARE_PROVIDER_SITE_OTHER): Payer: Medicare Other | Admitting: Internal Medicine

## 2015-11-29 VITALS — BP 106/80 | HR 100 | Temp 97.4°F | Resp 12 | Wt 125.0 lb

## 2015-11-29 DIAGNOSIS — B9789 Other viral agents as the cause of diseases classified elsewhere: Secondary | ICD-10-CM | POA: Diagnosis not present

## 2015-11-29 DIAGNOSIS — J069 Acute upper respiratory infection, unspecified: Secondary | ICD-10-CM

## 2015-11-29 MED ORDER — BENZONATATE 200 MG PO CAPS: 200.0000 mg | ORAL_CAPSULE | Freq: Three times a day (TID) | ORAL | 0 refills | Status: DC | PRN

## 2015-11-29 NOTE — Progress Notes (Signed)
Pre visit review using our clinic review tool, if applicable. No additional management support is needed unless otherwise documented below in the visit note. 

## 2015-11-29 NOTE — Progress Notes (Signed)
Subjective:    Patient ID: Jacqueline Meza, female    DOB: 08/26/1945, 70 y.o.   MRN: [id number]  HPI Here due to respiratory illness  2 weeks ago-- exposed to "bacterial bronchitis" via grandchildren  She started a few days ago Achy and congested Ears are popping Bad cough--especially at night. Dry Not much drainage from nose---clear  No fever No chills but some sweats at night Slight SOB No ear pain  Has taken advil and mucus releaser  Current Outpatient Prescriptions on File Prior to Visit  Medication Sig Dispense Refill  . Calcium-Magnesium-Vitamin D (CALCIUM 1200+D3 PO) Take 1 tablet by mouth daily.    . rizatriptan (MAXALT) 10 MG tablet Take 1 tablet (10 mg total) by mouth as needed for migraine. May repeat in 2 hours if needed 10 tablet 11  . topiramate (TOPAMAX) 100 MG tablet TAKE 1 TABLET BY MOUTH EVERY DAY 30 tablet 11  . vitamin B-12 (CYANOCOBALAMIN) 1000 MCG tablet Take 2,500 mcg by mouth daily.     No current facility-administered medications on file prior to visit.     Allergies  Allergen Reactions  . Codeine Sulfate     REACTION: headache and nausea    Past Medical History:  Diagnosis Date  . Endometriosis   . Hyperlipidemia   . Migraines    since childhood  . Raynaud's phenomenon     Past Surgical History:  Procedure Laterality Date  . ABDOMINAL HYSTERECTOMY  01/12/1993   endometriosis and worsening migraines  . CESAREAN SECTION  1988  . COLONOSCOPY  2005   Dr Bary Castilla  . DILATION AND CURETTAGE OF UTERUS     done after miscarriages twice    Family History  Problem Relation Age of Onset  . Migraines Sister   . Migraines Daughter   . Cancer Father     lymphoma  . Cancer Paternal Uncle 3    colon  . Cancer Paternal Aunt     breast  . CAD Neg Hx   . Stroke Neg Hx   . Diabetes Neg Hx     Social History   Social History  . Marital status: Divorced    Spouse name: N/A  . Number of children: 1  . Years of education: N/A    Occupational History  . Retired--curriculum facilitator     [place]  . Teaching part time--math intervention   . Cleans houses part time    Social History Main Topics  . Smoking status: Never Smoker  . Smokeless tobacco: Never Used  . Alcohol use Yes     Comment: regular wine/daily  . Drug use: No  . Sexual activity: No   Other Topics Concern  . Not on file   Social History Narrative   No living will   Request sister Holley Raring as health care POA.   Requests DNR after discussion --form done 04/22/12   No tube feedings if cognitively unaware   Review of Systems Some hip problems---seeing chiropractor No rash No vomiting or diarrhea Appetite is okay    Objective:   Physical Exam  Constitutional: She appears well-developed. No distress.  HENT:  No sinus tenderness Mild nasal inflammation TMs normal Slight pharyngeal injection without tonsillar enlargement  Neck: Normal range of motion. No thyromegaly present.  Pulmonary/Chest: Effort normal and breath sounds normal. No respiratory distress. She has no wheezes. She has no rales.  Lymphadenopathy:    She has no cervical adenopathy.  Assessment & Plan:

## 2015-11-29 NOTE — Assessment & Plan Note (Signed)
Classic viral infection --mostly head Discussed supportive care Cough syrup If worsens next week, could try empiric antibiotic

## 2016-03-17 ENCOUNTER — Other Ambulatory Visit: Payer: Self-pay | Admitting: Internal Medicine

## 2016-03-17 DIAGNOSIS — Z1231 Encounter for screening mammogram for malignant neoplasm of breast: Secondary | ICD-10-CM

## 2016-04-22 ENCOUNTER — Ambulatory Visit
Admission: RE | Admit: 2016-04-22 | Discharge: 2016-04-22 | Disposition: A | Discharge status: Home / Self Care | Payer: Medicare Other | Source: Ambulatory Visit | Attending: Internal Medicine | Admitting: Internal Medicine

## 2016-04-22 DIAGNOSIS — Z1231 Encounter for screening mammogram for malignant neoplasm of breast: Secondary | ICD-10-CM | POA: Diagnosis not present

## 2016-06-09 ENCOUNTER — Other Ambulatory Visit: Payer: Self-pay | Admitting: Internal Medicine

## 2016-07-01 ENCOUNTER — Ambulatory Visit: Payer: Medicare Other

## 2016-07-07 ENCOUNTER — Other Ambulatory Visit: Payer: Self-pay | Admitting: Internal Medicine

## 2016-08-04 ENCOUNTER — Other Ambulatory Visit: Payer: Self-pay | Admitting: Internal Medicine

## 2016-08-19 ENCOUNTER — Encounter: Payer: Self-pay | Admitting: Internal Medicine

## 2016-08-19 ENCOUNTER — Ambulatory Visit (INDEPENDENT_AMBULATORY_CARE_PROVIDER_SITE_OTHER): Payer: Medicare Other | Admitting: Internal Medicine

## 2016-08-19 VITALS — BP 108/62 | HR 88 | Temp 98.5°F | Ht 62.0 in | Wt 117.0 lb

## 2016-08-19 DIAGNOSIS — Z0000 Encounter for general adult medical examination without abnormal findings: Secondary | ICD-10-CM

## 2016-08-19 DIAGNOSIS — G43009 Migraine without aura, not intractable, without status migrainosus: Secondary | ICD-10-CM | POA: Diagnosis not present

## 2016-08-19 DIAGNOSIS — Z7189 Other specified counseling: Secondary | ICD-10-CM | POA: Diagnosis not present

## 2016-08-19 LAB — CBC WITH DIFFERENTIAL/PLATELET
BASOS PCT: 0.6 % (ref 0.0–3.0)
Basophils Absolute: 0 10*3/uL (ref 0.0–0.1)
EOS PCT: 10.5 % — AB (ref 0.0–5.0)
Eosinophils Absolute: 0.7 10*3/uL (ref 0.0–0.7)
HEMATOCRIT: 43.1 % (ref 36.0–46.0)
HEMOGLOBIN: 14.2 g/dL (ref 12.0–15.0)
LYMPHS PCT: 35.5 % (ref 12.0–46.0)
Lymphs Abs: 2.3 10*3/uL (ref 0.7–4.0)
MCHC: 33 g/dL (ref 30.0–36.0)
MCV: 108.1 fl — ABNORMAL HIGH (ref 78.0–100.0)
MONOS PCT: 9.3 % (ref 3.0–12.0)
Monocytes Absolute: 0.6 10*3/uL (ref 0.1–1.0)
Neutro Abs: 2.9 10*3/uL (ref 1.4–7.7)
Neutrophils Relative %: 44.1 % (ref 43.0–77.0)
Platelets: 219 10*3/uL (ref 150.0–400.0)
RBC: 3.99 Mil/uL (ref 3.87–5.11)
RDW: 13.9 % (ref 11.5–15.5)
WBC: 6.6 10*3/uL (ref 4.0–10.5)

## 2016-08-19 LAB — COMPREHENSIVE METABOLIC PANEL
ALBUMIN: 4.3 g/dL (ref 3.5–5.2)
ALK PHOS: 56 U/L (ref 39–117)
ALT: 17 U/L (ref 0–35)
AST: 18 U/L (ref 0–37)
BUN: 16 mg/dL (ref 6–23)
CALCIUM: 9.2 mg/dL (ref 8.4–10.5)
CHLORIDE: 107 meq/L (ref 96–112)
CO2: 30 mEq/L (ref 19–32)
Creatinine, Ser: 0.85 mg/dL (ref 0.40–1.20)
GFR: 70.37 mL/min (ref >60.00)
Glucose, Bld: 87 mg/dL (ref 70–99)
POTASSIUM: 4.7 meq/L (ref 3.5–5.1)
Sodium: 141 mEq/L (ref 135–145)
Total Bilirubin: 0.5 mg/dL (ref 0.2–1.2)
Total Protein: 7.2 g/dL (ref 6.0–8.3)

## 2016-08-19 NOTE — Assessment & Plan Note (Signed)
Has DNR 

## 2016-08-19 NOTE — Assessment & Plan Note (Signed)
Doing well with topamax prophylaxis Will check routine labs on this med

## 2016-08-19 NOTE — Progress Notes (Signed)
Subjective:    Patient ID: Jacqueline Meza, female    DOB: [birth year], 71 y.o.   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 rizatriptan (MAXALT) 10 MG tablet TAKE 1 TABLET BY MOUTH AS NEEDED FOR MIGRAINE, MAY REPEAT IN 2 HOURS IF NEEDED 10 tablet 0  . topiramate (TOPAMAX) 100 MG tablet TAKE 1 TABLET BY MOUTH EVERY DAY 30 tablet 11  . vitamin B-12 (CYANOCOBALAMIN) 1000 MCG tablet Take 2,500 mcg by mouth daily.     No current facility-administered medications on file prior to visit.     Allergies  Allergen Reactions  . Codeine Sulfate     REACTION: headache and nausea    Past Medical History:  Diagnosis Date  . Endometriosis   . Hyperlipidemia   . Migraines    since childhood  . Raynaud's phenomenon     Past Surgical History:  Procedure Laterality Date  . ABDOMINAL HYSTERECTOMY  01/12/1993   endometriosis and worsening migraines  . CESAREAN SECTION  1988  . COLONOSCOPY  2005   Dr Bary Castilla  . DILATION AND CURETTAGE OF UTERUS     done after miscarriages twice    Family History  Problem Relation Age of Onset  . Migraines Sister   . Cancer Father        lymphoma  . Migraines Daughter   . Cancer Paternal Uncle 77       colon  . Cancer Paternal Aunt        breast  . CAD Neg Hx   . Stroke Neg Hx   . Diabetes Neg Hx   . Breast cancer Neg Hx      Social History   Social History  . Marital status: Divorced    Spouse name: N/A  . Number of children: 1  . Years of education: N/A   Occupational History  . Retired--curriculum facilitator     [employer]  . Teaching part time--math intervention   . Cleans houses part time     stopped this   Social History Main Topics  . Smoking status: Never Smoker  . Smokeless tobacco: Never Used  . Alcohol use Yes     Comment: regular wine/daily  . Drug use: No  . Sexual activity: No   Other Topics Concern  . Not on file   Social History Narrative   No living will   Request sister Holley Raring as health care POA.   Requests DNR after discussion --form done 04/22/12   No tube feedings if cognitively unaware   Review of Systems Appetite is good Weight down a bit--- she is working on it. At ideal body weight now Sleeps okay--- some tough nights Sees derm yearly--no new issues Wears seat belt Teeth are fine No chest pain or SOB No change in exercise tolerance No dizziness or syncope No heartburn or dysphagia Bowels are fine--no blood No urinary problems Some back pain---by right shoulder.  Chiropractor helps    Objective:   Physical Exam  Constitutional: She is oriented to person, place, and time. She appears well-nourished. No distress.  HENT:  Mouth/Throat: Oropharynx is clear and moist. No oropharyngeal exudate.  Neck: No thyromegaly present.  Cardiovascular: Normal rate, regular rhythm, normal heart sounds and intact distal pulses.  Exam reveals no gallop.   No murmur heard. Pulmonary/Chest: Effort normal and breath sounds normal. No respiratory distress. She has no wheezes. She has no rales.  Abdominal: Soft. She exhibits no distension. There is no tenderness. There is no rebound and no guarding.  Musculoskeletal: She exhibits no edema or tenderness.  Lymphadenopathy:    She has no cervical adenopathy.  Neurological: She is alert and oriented to person,  place, and time.  President--- "Trump, Obama, Clinton---Bush" [phone] D-l-r-o-w Recall 2/3  Skin: No rash noted. No erythema.  Psychiatric: She has a normal mood and affect. Her behavior is normal.          Assessment & Plan:

## 2016-08-19 NOTE — Assessment & Plan Note (Signed)
I have personally reviewed the Medicare Annual Wellness questionnaire and have noted 1. The patient's medical and social history 2. Their use of alcohol, tobacco or illicit drugs 3. Their current medications and supplements 4. The patient's functional ability including ADL's, fall risks, home safety risks and hearing or visual             impairment. 5. Diet and physical activities 6. Evidence for depression or mood disorders  The patients weight, height, BMI and visual acuity have been recorded in the chart I have made referrals, counseling and provided education to the patient based review of the above and I have provided the pt with a written personalized care plan for preventive services.  I have provided you with a copy of your personalized plan for preventive services. Please take the time to review along with your updated medication list.  Keeps fit Yearly flu vaccine Will discuss shingrix next year mammo due 2020 Colon due 2025--might just do FIT then

## 2016-09-01 ENCOUNTER — Other Ambulatory Visit: Payer: Self-pay | Admitting: Internal Medicine

## 2016-09-01 NOTE — Telephone Encounter (Signed)
Please advise on refill requests. Also the last time the Maxalt was filled on 7/24 it was for a qty of #10, unsure if you want to keep that same amount.

## 2016-09-02 NOTE — Telephone Encounter (Signed)
Approved: okay topiramate for a year Rizatriptan #10 x 5

## 2017-03-08 ENCOUNTER — Other Ambulatory Visit: Payer: Self-pay | Admitting: Internal Medicine

## 2017-04-12 ENCOUNTER — Encounter: Payer: Self-pay | Admitting: Internal Medicine

## 2017-04-12 ENCOUNTER — Ambulatory Visit: Payer: Medicare Other | Admitting: Internal Medicine

## 2017-04-12 VITALS — BP 118/76 | HR 87 | Temp 97.9°F | Ht 62.0 in | Wt 123.0 lb

## 2017-04-12 DIAGNOSIS — G43019 Migraine without aura, intractable, without status migrainosus: Secondary | ICD-10-CM | POA: Diagnosis not present

## 2017-04-12 MED ORDER — SUMATRIPTAN SUCCINATE 100 MG PO TABS: 100.0000 mg | ORAL_TABLET | Freq: Every day | ORAL | 5 refills | Status: DC | PRN

## 2017-04-12 NOTE — Assessment & Plan Note (Signed)
Worse now since relpax stopped by insurance Will switch to sumatriptan to see if that helps Naproxen to abort as well If they persist, will increase the topiramate in 25mg  increments

## 2017-04-12 NOTE — Progress Notes (Signed)
Subjective:    Patient ID: Jacqueline Meza, female    DOB: 10/22/1945, 72 y.o.   MRN: [id number]  HPI Here due to problems with migraines Having a headache every other day for about 2 months  Insurance stopped paying for relpax maxalt not really helping--but does help some Nausea is inconsistent--not as bad since her hysterectomy Photophobia and sonophobia  Can be anytime Does have some awareness of headache coming on (like a "twinge" but not classic aura) Uses advil at time--it helps  Elavil didn't help in the past  Current Outpatient Medications on File Prior to Visit  Medication Sig Dispense Refill  . Multiple Vitamin (MULTIVITAMIN) tablet Take 1 tablet by mouth daily.    . rizatriptan (MAXALT) 10 MG tablet TAKE 1 TABLET BY MOUTH AS NEEDED FOR MIGRAINE, MAY REPEAT IN 2 HOURS IF NEEDED 10 tablet 5  . topiramate (TOPAMAX) 100 MG tablet TAKE 1 TABLET BY MOUTH EVERY DAY 30 tablet 11  . vitamin B-12 (CYANOCOBALAMIN) 1000 MCG tablet Take 2,500 mcg by mouth daily.     No current facility-administered medications on file prior to visit.     Allergies  Allergen Reactions  . Codeine Sulfate     REACTION: headache and nausea    Past Medical History:  Diagnosis Date  . Endometriosis   . Hyperlipidemia   . Migraines    since childhood  . Raynaud's phenomenon     Past Surgical History:  Procedure Laterality Date  . ABDOMINAL HYSTERECTOMY  01/12/1993   endometriosis and worsening migraines  . CESAREAN SECTION  1988  . COLONOSCOPY  2005   Dr Bary Castilla  . DILATION AND CURETTAGE OF UTERUS     done after miscarriages twice    Family History  Problem Relation Age of Onset  . Migraines Sister   . Cancer Father        lymphoma  . Migraines Daughter   . Cancer Paternal Uncle 33       colon  . Cancer Paternal Aunt        breast  . CAD Neg Hx   . Stroke Neg Hx   . Diabetes Neg Hx   . Breast cancer Neg Hx     Social History   Socioeconomic History  . Marital  status: Divorced    Spouse name: Not on file  . Number of children: 1  . Years of education: Not on file  . Highest education level: Not on file  Occupational History  . Occupation: Retired--curriculum facilitator    Comment: [employer]  . Occupation: Teaching part time--math intervention  . Occupation: Microbiologist houses part time    Comment: stopped this  Social Needs  . Financial resource strain: Not on file  . Food insecurity:    Worry: Not on file    Inability: Not on file  . Transportation needs:    Medical: Not on file    Non-medical: Not on file  Tobacco Use  . Smoking status: Never Smoker  . Smokeless tobacco: Never Used  Substance and Sexual Activity  . Alcohol use: Yes    Comment: regular wine/daily  . Drug use: No  . Sexual activity: Never  Lifestyle  . Physical activity:    Days per week: Not on file    Minutes per session: Not on file  . Stress: Not on file  Relationships  . Social connections:    Talks on phone: Not on file    Gets together: Not on file  Attends religious service: Not on file    Active member of club or organization: Not on file    Attends meetings of clubs or organizations: Not on file    Relationship status: Not on file  . Intimate partner violence:    Fear of current or ex partner: Not on file    Emotionally abused: Not on file    Physically abused: Not on file    Forced sexual activity: Not on file  Other Topics Concern  . Not on file  Social History Narrative   No living will   Request sister Holley Raring as health care POA.   Requests DNR after discussion --form done 04/22/12   No tube feedings if cognitively unaware   Review of Systems Sees chiropractor for chronic neck pain--discussed considering breast reduction Wears sports bra to flatten them    Objective:   Physical Exam  Constitutional: She appears well-developed. No distress.  Neurological: No cranial nerve deficit.          Assessment & Plan:

## 2017-04-12 NOTE — Patient Instructions (Signed)
Please try the sumatriptan and 2 aleve (naproxen) 220mg  to abort your headaches when they come on. If they are not better in the next 1-2 weeks, we will start increasing the topiramate.

## 2017-05-10 ENCOUNTER — Other Ambulatory Visit: Payer: Self-pay | Admitting: Internal Medicine

## 2017-05-10 ENCOUNTER — Encounter: Payer: Self-pay | Admitting: Internal Medicine

## 2017-05-10 MED ORDER — TOPIRAMATE 25 MG PO TABS: 25.0000 mg | ORAL_TABLET | Freq: Every day | ORAL | 1 refills | Status: DC

## 2017-06-18 ENCOUNTER — Other Ambulatory Visit: Payer: Self-pay | Admitting: Internal Medicine

## 2017-06-18 DIAGNOSIS — Z1231 Encounter for screening mammogram for malignant neoplasm of breast: Secondary | ICD-10-CM

## 2017-06-28 ENCOUNTER — Ambulatory Visit
Admission: RE | Admit: 2017-06-28 | Discharge: 2017-06-28 | Disposition: A | Discharge status: Home / Self Care | Payer: Medicare Other | Source: Ambulatory Visit | Attending: Internal Medicine | Admitting: Internal Medicine

## 2017-06-28 DIAGNOSIS — Z1231 Encounter for screening mammogram for malignant neoplasm of breast: Secondary | ICD-10-CM | POA: Insufficient documentation

## 2017-08-24 ENCOUNTER — Encounter: Payer: Medicare Other | Admitting: Internal Medicine

## 2017-09-17 ENCOUNTER — Encounter: Payer: Medicare Other | Admitting: Internal Medicine

## 2017-09-24 ENCOUNTER — Other Ambulatory Visit: Payer: Self-pay | Admitting: Internal Medicine

## 2017-11-03 ENCOUNTER — Telehealth: Payer: Self-pay | Admitting: Internal Medicine

## 2017-11-03 MED ORDER — TOPIRAMATE 100 MG PO TABS: 100.0000 mg | ORAL_TABLET | Freq: Two times a day (BID) | ORAL | 11 refills | Status: DC

## 2017-11-03 NOTE — Telephone Encounter (Signed)
New rx for 100mg  twice a day sent in

## 2017-11-03 NOTE — Telephone Encounter (Signed)
Copied from [place] [id number]. Topic: Quick Communication - Rx Refill/Question >> Nov 03, 2017 10:59 AM Jacqueline Meza wrote: Medication: topiramate (TOPAMAX) 100 MG tablet   Pt called stating that she has spoken with her pharmacy, who advised her to contact office, about this medication stating that RX needs to be resent with instructions that pt takes this medication x2 a day rather than x1 a day as it is currently written. Pt has one tablet left and is going to be out of town until Monday 10/28.   Has the patient contacted their pharmacy? Yes, pt was advised to contact office.  Preferred Pharmacy (with phone number or street name):[id number] [name], [place] [phone] (Phone) [phone] (Fax)

## 2017-11-08 MED ORDER — SUMATRIPTAN SUCCINATE 100 MG PO TABS: 100.0000 mg | ORAL_TABLET | Freq: Every day | ORAL | 5 refills | Status: DC | PRN

## 2017-12-28 ENCOUNTER — Ambulatory Visit: Payer: Medicare Other | Admitting: General Surgery

## 2017-12-28 ENCOUNTER — Other Ambulatory Visit: Payer: Self-pay

## 2017-12-28 ENCOUNTER — Encounter: Payer: Self-pay | Admitting: General Surgery

## 2017-12-28 VITALS — BP 124/80 | HR 89 | Resp 16 | Ht 63.0 in | Wt 125.0 lb

## 2017-12-28 DIAGNOSIS — R0789 Other chest pain: Secondary | ICD-10-CM

## 2017-12-28 NOTE — Progress Notes (Addendum)
Patient ID: Jacqueline Meza, female   DOB: 1945-03-15, 72 y.o.   MRN: [id number]  Chief Complaint  Patient presents with  . Other    right breast pain    HPI Jacqueline Meza is a 72 y.o. female.  Here for evaluation of right breast pain described as "tenderness". She states she has noticed this for about 1-2 months. Denies any injury or trauma, she does have a 20 year old grandson that "head butts" her but not hard. No routine activities make it worse. The patient has not noted any particular activity will result in discomfort, more direct pressure to the area.  The patient denies pain with inspiration or any recent respiratory illness.  No fever or chills.  No unexplained weight loss.  Mammogram was in June.  She does not feel any lumps. She does go to the chiropractor for back issues. The patient was last seen here in 2015 at which time she underwent a colonoscopy with a repeat exam due in 2025. HPI  Past Medical History:  Diagnosis Date  . Endometriosis   . Hyperlipidemia   . Migraines    since childhood  . Raynaud's phenomenon     Past Surgical History:  Procedure Laterality Date  . ABDOMINAL HYSTERECTOMY  01/12/1993   endometriosis and worsening migraines  . CESAREAN SECTION  1988  . COLONOSCOPY  2005   Dr Bary Castilla  . DILATION AND CURETTAGE OF UTERUS     done after miscarriages twice    Family History  Problem Relation Age of Onset  . Migraines Sister   . Cancer Father        lymphoma  . Bone cancer Father   . Kidney failure Mother   . Migraines Daughter   . Cancer Paternal Uncle 68       colon  . Cancer Paternal Aunt        breast  . CAD Neg Hx   . Stroke Neg Hx   . Diabetes Neg Hx   . Breast cancer Neg Hx     Social History Social History   Tobacco Use  . Smoking status: Never Smoker  . Smokeless tobacco: Never Used  Substance Use Topics  . Alcohol use: Yes    Comment: regular wine/daily  . Drug use: No    Allergies  Allergen Reactions  .  Codeine Sulfate     REACTION: headache and nausea    Current Outpatient Medications  Medication Sig Dispense Refill  . BIOTIN 5000 PO Take 1 capsule by mouth daily.    Marland Kitchen CINNAMON PO Take by mouth.    . Multiple Vitamin (MULTIVITAMIN) tablet Take 1 tablet by mouth daily.    . SUMAtriptan (IMITREX) 100 MG tablet Take 1 tablet (100 mg total) by mouth daily as needed for migraine. 10 tablet 5  . topiramate (TOPAMAX) 100 MG tablet Take 1 tablet (100 mg total) by mouth 2 (two) times daily. 60 tablet 11  . vitamin B-12 (CYANOCOBALAMIN) 1000 MCG tablet Take 2,500 mcg by mouth daily.     No current facility-administered medications for this visit.     Review of Systems Review of Systems  Constitutional: Negative.   HENT: Negative.   Respiratory: Negative.   Cardiovascular: Negative.   Gastrointestinal: Negative.   Endocrine: Negative.   Genitourinary: Negative.   Musculoskeletal: Negative.   Allergic/Immunologic: Negative.   Neurological: Negative.   Hematological: Negative.     Blood pressure 124/80, pulse 89, resp. rate 16, height 5\' 3"  (1.6  m), weight 125 lb (56.7 kg), SpO2 97 %.  Physical Exam Physical Exam Exam conducted with a chaperone present.  Constitutional:      Appearance: She is well-developed.  Eyes:     General: No scleral icterus.    Conjunctiva/sclera: Conjunctivae normal.  Neck:     Musculoskeletal: Neck supple.  Cardiovascular:     Rate and Rhythm: Normal rate and regular rhythm.     Heart sounds: Normal heart sounds.  Pulmonary:     Effort: Pulmonary effort is normal.     Breath sounds: Normal breath sounds.  Chest:     Breasts:        Right: No inverted nipple, mass, nipple discharge, skin change or tenderness.        Left: No inverted nipple, mass, nipple discharge, skin change or tenderness.    Lymphadenopathy:     Cervical: No cervical adenopathy.     Upper Body:     Right upper body: No supraclavicular or axillary adenopathy.     Left upper  body: No supraclavicular or axillary adenopathy.  Skin:    General: Skin is warm and dry.  Neurological:     Mental Status: She is alert and oriented to person, place, and time.  Psychiatric:        Behavior: Behavior normal.     Data Reviewed Bilateral screening mammograms dated June 28, 2017 were reviewed.  Primarily fatty replaced breast.  No interval change from 2018 films.  BI-RADS-1.  Assessment    Benign breast exam.  Lateral chest wall tenderness suggestive of musculoskeletal source for intermittent discomfort.    Plan    Follow up as needed or if symptoms worsen. The patient is aware to call back for any questions or new concerns.      HPI, Physical Exam, Assessment and Plan have been scribed under the direction and in the presence of Jacqueline Bellow, MD. Jacqueline Fetch, RN  I have completed the exam and reviewed the above documentation for accuracy and completeness.  I agree with the above.  Haematologist has been used and any errors in dictation or transcription are unintentional.  Jacqueline Meza, M.D., F.A.C.S.   Jacqueline Meza 12/29/2017, 5:36 AM

## 2017-12-28 NOTE — Patient Instructions (Signed)
The patient is aware to call back for any questions or new concerns.  

## 2017-12-29 DIAGNOSIS — R0789 Other chest pain: Secondary | ICD-10-CM | POA: Insufficient documentation

## 2018-01-15 ENCOUNTER — Other Ambulatory Visit: Payer: Self-pay | Admitting: Internal Medicine

## 2018-01-18 ENCOUNTER — Encounter

## 2018-01-18 ENCOUNTER — Ambulatory Visit (INDEPENDENT_AMBULATORY_CARE_PROVIDER_SITE_OTHER): Payer: Medicare Other | Admitting: Internal Medicine

## 2018-01-18 ENCOUNTER — Encounter: Payer: Self-pay | Admitting: Internal Medicine

## 2018-01-18 VITALS — BP 118/70 | HR 76 | Temp 97.5°F | Ht 62.0 in | Wt 126.0 lb

## 2018-01-18 DIAGNOSIS — G43019 Migraine without aura, intractable, without status migrainosus: Secondary | ICD-10-CM | POA: Diagnosis not present

## 2018-01-18 DIAGNOSIS — Z7189 Other specified counseling: Secondary | ICD-10-CM | POA: Diagnosis not present

## 2018-01-18 DIAGNOSIS — Z23 Encounter for immunization: Secondary | ICD-10-CM | POA: Diagnosis not present

## 2018-01-18 DIAGNOSIS — Z0000 Encounter for general adult medical examination without abnormal findings: Secondary | ICD-10-CM | POA: Diagnosis not present

## 2018-01-18 LAB — COMPREHENSIVE METABOLIC PANEL
ALT: 23 U/L (ref 0–35)
AST: 23 U/L (ref 0–37)
Albumin: 4.5 g/dL (ref 3.5–5.2)
Alkaline Phosphatase: 56 U/L (ref 39–117)
BUN: 21 mg/dL (ref 6–23)
CO2: 26 mEq/L (ref 19–32)
Calcium: 10 mg/dL (ref 8.4–10.5)
Chloride: 107 mEq/L (ref 96–112)
Creatinine, Ser: 0.72 mg/dL (ref 0.40–1.20)
GFR: 84.88 mL/min (ref >60.00)
Glucose, Bld: 102 mg/dL — ABNORMAL HIGH (ref 70–99)
Potassium: 4.9 mEq/L (ref 3.5–5.1)
Sodium: 140 mEq/L (ref 135–145)
Total Bilirubin: 0.5 mg/dL (ref 0.2–1.2)
Total Protein: 7.3 g/dL (ref 6.0–8.3)

## 2018-01-18 LAB — CBC
HEMATOCRIT: 43.7 % (ref 36.0–46.0)
HEMOGLOBIN: 14.7 g/dL (ref 12.0–15.0)
MCHC: 33.6 g/dL (ref 30.0–36.0)
MCV: 109.6 fl — ABNORMAL HIGH (ref 78.0–100.0)
Platelets: 232 10*3/uL (ref 150.0–400.0)
RBC: 3.99 Mil/uL (ref 3.87–5.11)
RDW: 13.4 % (ref 11.5–15.5)
WBC: 5.7 10*3/uL (ref 4.0–10.5)

## 2018-01-18 MED ORDER — TETANUS-DIPHTHERIA TOXOIDS TD 5-2 LFU IM INJ: 0.5000 mL | INJECTION | Freq: Once | INTRAMUSCULAR | 0 refills | Status: AC

## 2018-01-18 NOTE — Addendum Note (Signed)
Addended by: Pilar Grammes on: 01/18/2018 11:21 AM   Modules accepted: Orders

## 2018-01-18 NOTE — Assessment & Plan Note (Signed)
Not as good without the relpax but reasonable control

## 2018-01-18 NOTE — Assessment & Plan Note (Addendum)
I have personally reviewed the Medicare Annual Wellness questionnaire and have noted 1. The patient's medical and social history 2. Their use of alcohol, tobacco or illicit drugs 3. Their current medications and supplements 4. The patient's functional ability including ADL's, fall risks, home safety risks and hearing or visual             impairment. 5. Diet and physical activities 6. Evidence for depression or mood disorders  The patients weight, height, BMI and visual acuity have been recorded in the chart I have made referrals, counseling and provided education to the patient based review of the above and I have provided the pt with a written personalized care plan for preventive services.  I have provided you with a copy of your personalized plan for preventive services. Please take the time to review along with your updated medication list.  Healthy Discussed fitness Td rx Recommended shingrix Consider colon 2025 Mammogram due 2021 Flu vaccine today (seems to be getting over viral URI)

## 2018-01-18 NOTE — Assessment & Plan Note (Signed)
See social history °Has DNR °

## 2018-01-18 NOTE — Progress Notes (Signed)
Hearing Screening   Method: Audiometry   125Hz  250Hz  500Hz  1000Hz  2000Hz  3000Hz  4000Hz  6000Hz  8000Hz   Right ear:   20 20 20  20     Left ear:   20 20 20  20     Vision Screening Comments: Late January 2019. Pt is schedule for later this month.

## 2018-01-18 NOTE — Patient Instructions (Signed)
Please get your tetanus booster at the pharmacy and ask about the shingrix.

## 2018-01-18 NOTE — Progress Notes (Signed)
Subjective:    Patient ID: Jacqueline Meza, female    DOB: 06/25/1945, 73 y.o.   MRN: [id number]  HPI Here for Medicare wellness visit and follow up of chronic health conditions Reviewed form and advanced directives Reviewed other doctors Still enjoys wine daily-- 1-2 No tobacco Stays active caring for grandchildren-- 44 years old. Stays there during the week when she cares for them (other side of [place]) No falls No depression or anhedonia Remains independent with instrumental ADLs No sig memory problems  Just getting over a respiratory infection Cough, sore throat, nasal congestion Now improving  Migraines relatively controlled Still gets them regularly imitrex does help abort--but not as good as relpax  Current Outpatient Medications on File Prior to Visit  Medication Sig Dispense Refill  . BIOTIN 5000 PO Take 1 capsule by mouth daily.    Marland Kitchen CINNAMON PO Take by mouth.    . Multiple Vitamin (MULTIVITAMIN) tablet Take 1 tablet by mouth daily.    . SUMAtriptan (IMITREX) 100 MG tablet TAKE 1 TABLET(100 MG) BY MOUTH DAILY AS NEEDED FOR MIGRAINE 10 tablet 11  . topiramate (TOPAMAX) 100 MG tablet Take 1 tablet (100 mg total) by mouth 2 (two) times daily. 60 tablet 11  . vitamin B-12 (CYANOCOBALAMIN) 1000 MCG tablet Take 2,500 mcg by mouth daily.     No current facility-administered medications on file prior to visit.     Allergies  Allergen Reactions  . Codeine Sulfate     REACTION: headache and nausea    Past Medical History:  Diagnosis Date  . Endometriosis   . Hyperlipidemia   . Migraines    since childhood  . Raynaud's phenomenon     Past Surgical History:  Procedure Laterality Date  . ABDOMINAL HYSTERECTOMY  01/12/1993   endometriosis and worsening migraines  . CESAREAN SECTION  1988  . COLONOSCOPY  2005   Dr Bary Castilla  . DILATION AND CURETTAGE OF UTERUS     done after miscarriages twice    Family History  Problem Relation Age of Onset  . Migraines  Sister   . Cancer Father        lymphoma  . Bone cancer Father   . Kidney failure Mother   . Migraines Daughter   . Cancer Paternal Uncle 73       colon  . Cancer Paternal Aunt        breast  . CAD Neg Hx   . Stroke Neg Hx   . Diabetes Neg Hx   . Breast cancer Neg Hx     Social History   Socioeconomic History  . Marital status: Divorced    Spouse name: Not on file  . Number of children: 1  . Years of education: Not on file  . Highest education level: Not on file  Occupational History  . Occupation: Retired--curriculum facilitator    Comment: [place]  . Occupation: Teaching part time--math intervention  . Occupation: Microbiologist houses part time    Comment: stopped this  Social Needs  . Financial resource strain: Not on file  . Food insecurity:    Worry: Not on file    Inability: Not on file  . Transportation needs:    Medical: Not on file    Non-medical: Not on file  Tobacco Use  . Smoking status: Never Smoker  . Smokeless tobacco: Never Used  Substance and Sexual Activity  . Alcohol use: Yes    Comment: regular wine/daily  . Drug use: No  .  Sexual activity: Never  Lifestyle  . Physical activity:    Days per week: Not on file    Minutes per session: Not on file  . Stress: Not on file  Relationships  . Social connections:    Talks on phone: Not on file    Gets together: Not on file    Attends religious service: Not on file    Active member of club or organization: Not on file    Attends meetings of clubs or organizations: Not on file    Relationship status: Not on file  . Intimate partner violence:    Fear of current or ex partner: Not on file    Emotionally abused: Not on file    Physically abused: Not on file    Forced sexual activity: Not on file  Other Topics Concern  . Not on file  Social History Narrative   No living will   Request sister Holley Raring as health care POA.   Requests DNR after discussion --form done 04/22/12   No tube  feedings if cognitively unaware   Review of Systems Appetite is fine Weight is fairly stable Sleep is still not great--no sig fatigue Wears seat belt Teeth okay---keeps up with dentist Yearly derm visit---no problems right now Bowels are fine. No blood No urinary problems. No incontinence Did see Dr Starleen Arms wall pain (no breast problem) Chronic neck and back pain--sees chiropractor every 2 weeks or so. This helps (headaches also) No heartburn dysphagia No chest pain or SOB No dizziness or syncope No palpitations No edema    Objective:   Physical Exam  Constitutional: She is oriented to person, place, and time. She appears well-developed. No distress.  HENT:  Mouth/Throat: Oropharynx is clear and moist. No oropharyngeal exudate.  Neck: No thyromegaly present.  Cardiovascular: Normal rate, regular rhythm, normal heart sounds and intact distal pulses. Exam reveals no gallop.  No murmur heard. Respiratory: Effort normal and breath sounds normal. No respiratory distress. She has no wheezes. She has no rales.  GI: Soft. There is no abdominal tenderness.  Musculoskeletal:        General: No tenderness or edema.  Lymphadenopathy:    She has no cervical adenopathy.  Neurological: She is alert and oriented to person, place, and time.  President--- "Gelene Mink" [id number] D-l-r-o-w Recall 3/3  Skin: No rash noted. No erythema.  Psychiatric: She has a normal mood and affect. Her behavior is normal.           Assessment & Plan:

## 2018-03-21 MED ORDER — TIZANIDINE HCL 2 MG PO CAPS: 2.0000 mg | ORAL_CAPSULE | Freq: Every evening | ORAL | 0 refills | Status: DC | PRN

## 2018-05-16 ENCOUNTER — Other Ambulatory Visit: Payer: Self-pay

## 2018-05-17 MED ORDER — TIZANIDINE HCL 2 MG PO CAPS: 2.0000 mg | ORAL_CAPSULE | Freq: Every evening | ORAL | 0 refills | Status: DC | PRN

## 2018-05-17 NOTE — Telephone Encounter (Signed)
Last filled 03-21-18 #30 Last OV 01-18-18 Next OV 01-23-19 [facility]

## 2018-06-07 ENCOUNTER — Other Ambulatory Visit: Payer: Self-pay | Admitting: Internal Medicine

## 2018-06-27 ENCOUNTER — Encounter: Payer: Self-pay | Admitting: Internal Medicine

## 2018-06-27 ENCOUNTER — Other Ambulatory Visit: Payer: Self-pay

## 2018-06-27 ENCOUNTER — Ambulatory Visit (INDEPENDENT_AMBULATORY_CARE_PROVIDER_SITE_OTHER): Payer: Medicare Other | Admitting: Internal Medicine

## 2018-06-27 DIAGNOSIS — J029 Acute pharyngitis, unspecified: Secondary | ICD-10-CM

## 2018-06-27 DIAGNOSIS — H9202 Otalgia, left ear: Secondary | ICD-10-CM | POA: Diagnosis not present

## 2018-06-27 DIAGNOSIS — R51 Headache: Secondary | ICD-10-CM

## 2018-06-27 DIAGNOSIS — R519 Headache, unspecified: Secondary | ICD-10-CM

## 2018-06-27 MED ORDER — AMOXICILLIN 500 MG PO CAPS: 500.0000 mg | ORAL_CAPSULE | Freq: Three times a day (TID) | ORAL | 0 refills | Status: DC

## 2018-06-27 NOTE — Patient Instructions (Signed)
Sore Throat  When you have a sore throat, your throat may feel:  · Tender.  · Burning.  · Irritated.  · Scratchy.  · Painful when you swallow.  · Painful when you talk.  Many things can cause a sore throat, such as:  · An infection.  · Allergies.  · Dry air.  · Smoke or pollution.  · Radiation treatment.  · Gastroesophageal reflux disease (GERD).  · A tumor.  A sore throat can be the first sign of another sickness. It can happen with other problems, like:  · Coughing.  · Sneezing.  · Fever.  · Swelling in the neck.  Most sore throats go away without treatment.  Follow these instructions at home:         · Take over-the-counter medicines only as told by your doctor.  ? If your child has a sore throat, do not give your child aspirin.  · Drink enough fluids to keep your pee (urine) pale yellow.  · Rest when you feel you need to.  · To help with pain:  ? Sip warm liquids, such as broth, herbal tea, or warm water.  ? Eat or drink cold or frozen liquids, such as frozen ice pops.  ? Gargle with a salt-water mixture 3-4 times a day or as needed. To make a salt-water mixture, add ½-1 tsp (3-6 g) of salt to 1 cup (237 mL) of warm water. Mix it until you cannot see the salt anymore.  ? Suck on hard candy or throat lozenges.  ? Put a cool-mist humidifier in your bedroom at night.  ? Sit in the bathroom with the door closed for 5-10 minutes while you run hot water in the shower.  · Do not use any products that contain nicotine or tobacco, such as cigarettes, e-cigarettes, and chewing tobacco. If you need help quitting, ask your doctor.  · Wash your hands well and often with soap and water. If soap and water are not available, use hand sanitizer.  Contact a doctor if:  · You have a fever for more than 2-3 days.  · You keep having symptoms for more than 2-3 days.  · Your throat does not get better in 7 days.  · You have a fever and your symptoms suddenly get worse.  · Your child who is 3 months to 3 years old has a temperature of  102.2°F (39°C) or higher.  Get help right away if:  · You have trouble breathing.  · You cannot swallow fluids, soft foods, or your saliva.  · You have swelling in your throat or neck that gets worse.  · You keep feeling sick to your stomach (nauseous).  · You keep throwing up (vomiting).  Summary  · A sore throat is pain, burning, irritation, or scratchiness in the throat. Many things can cause a sore throat.  · Take over-the-counter medicines only as told by your doctor. Do not give your child aspirin.  · Drink plenty of fluids, and rest as needed.  · Contact a doctor if your symptoms get worse or your sore throat does not get better within 7 days.  This information is not intended to replace advice given to you by your health care provider. Make sure you discuss any questions you have with your health care provider.  Document Released: 10/08/2007 Document Revised: 05/31/2017 Document Reviewed: 05/31/2017  Elsevier Interactive Patient Education © 2019 Elsevier Inc.

## 2018-06-27 NOTE — Progress Notes (Signed)
Virtual Visit via Video Note  I connected with Jacqueline Meza on 06/27/18 at 12:00 PM EDT by a video enabled telemedicine application and verified that I am speaking with the correct person using two identifiers.  Location: Patient: Home Provider: Office   I discussed the limitations of evaluation and management by telemedicine and the availability of in person appointments. The patient expressed understanding and agreed to proceed.  History of Present Illness:  Pt reports left sided facial pressure, ear pain and sore throat. She reports this started yesterday. She describes the ear pan and sore throat as sharp and stabbing. The pain is worse with swallowing. She denies runny nose, nasal congestion, cough or shortness of breath. She denies fever, chills or body aches. She has taken Ibuprofen with minimal relief. She denies known, sick contacts but has been watching her grandchildren. She reports she has had strep throat in the past and reports this feels the same.     Past Medical History:  Diagnosis Date  . Endometriosis   . Hyperlipidemia   . Migraines    since childhood  . Raynaud's phenomenon     Current Outpatient Medications  Medication Sig Dispense Refill  . BIOTIN 5000 PO Take 1 capsule by mouth daily.    Marland Kitchen CINNAMON PO Take by mouth.    . Multiple Vitamin (MULTIVITAMIN) tablet Take 1 tablet by mouth daily.    . SUMAtriptan (IMITREX) 100 MG tablet TAKE 1 TABLET(100 MG) BY MOUTH DAILY AS NEEDED FOR MIGRAINE 10 tablet 7  . tizanidine (ZANAFLEX) 2 MG capsule Take 1 capsule (2 mg total) by mouth at bedtime as needed for muscle spasms. 30 capsule 0  . topiramate (TOPAMAX) 100 MG tablet Take 1 tablet (100 mg total) by mouth 2 (two) times daily. 60 tablet 11  . vitamin B-12 (CYANOCOBALAMIN) 1000 MCG tablet Take 2,500 mcg by mouth daily.    Marland Kitchen amoxicillin (AMOXIL) 500 MG capsule Take 1 capsule (500 mg total) by mouth 3 (three) times daily. 30 capsule 0   No current  facility-administered medications for this visit.     Allergies  Allergen Reactions  . Codeine Sulfate     REACTION: headache and nausea    Family History  Problem Relation Age of Onset  . Migraines Sister   . Cancer Father        lymphoma  . Bone cancer Father   . Kidney failure Mother   . Migraines Daughter   . Cancer Paternal Uncle 49       colon  . Cancer Paternal Aunt        breast  . CAD Neg Hx   . Stroke Neg Hx   . Diabetes Neg Hx   . Breast cancer Neg Hx     Social History   Socioeconomic History  . Marital status: Divorced    Spouse name: Not on file  . Number of children: 1  . Years of education: Not on file  . Highest education level: Not on file  Occupational History  . Occupation: Retired--curriculum facilitator    Comment: [place]  . Occupation: Printmaker part time--math intervention    Comment: retired  . Occupation: Microbiologist houses part time    Comment: stopped this  Social Needs  . Financial resource strain: Not on file  . Food insecurity    Worry: Not on file    Inability: Not on file  . Transportation needs    Medical: Not on file    Non-medical: Not  on file  Tobacco Use  . Smoking status: Never Smoker  . Smokeless tobacco: Never Used  Substance and Sexual Activity  . Alcohol use: Yes    Comment: regular wine/daily  . Drug use: No  . Sexual activity: Never  Lifestyle  . Physical activity    Days per week: Not on file    Minutes per session: Not on file  . Stress: Not on file  Relationships  . Social Herbalist on phone: Not on file    Gets together: Not on file    Attends religious service: Not on file    Active member of club or organization: Not on file    Attends meetings of clubs or organizations: Not on file    Relationship status: Not on file  . Intimate partner violence    Fear of current or ex partner: Not on file    Emotionally abused: Not on file    Physically abused: Not on file     Forced sexual activity: Not on file  Other Topics Concern  . Not on file  Social History Narrative   No living will   Request sister Holley Raring as health care POA.   Requests DNR after discussion --form done 04/22/12   No tube feedings if cognitively unaware     Constitutional: Denies fever, malaise, fatigue, headache or abrupt weight changes.  HEENT: Pt reports facial pressure, ear pain and sore throat. Denies eye pain, eye redness, ringing in the ears, wax buildup, runny nose, nasal congestion, bloody nose Respiratory: Denies difficulty breathing, shortness of breath, cough or sputum production.   Cardiovascular: Denies chest pain, chest tightness, palpitations or swelling in the hands or feet.   No other specific complaints in a complete review of systems (except as listed in HPI above).  Wt Readings from Last 3 Encounters:  01/18/18 126 lb (57.2 kg)  12/28/17 125 lb (56.7 kg)  04/12/17 123 lb (55.8 kg)    General: Appears her stated age, in NAD. HEENT: Head: normal shape and size; Eyes: sclera white, EOMs intact;  Neck:  No obvious swelling noted. Cardiovascular: Normal rate and rhythm. S1,S2 noted.  No murmur, rubs or gallops noted.  Pulmonary/Chest: Normal effort. No respiratory distress.  Neurological: Alert and oriented.   BMET    Component Value Date/Time   NA 140 01/18/2018 1121   K 4.9 01/18/2018 1121   CL 107 01/18/2018 1121   CO2 26 01/18/2018 1121   GLUCOSE 102 (H) 01/18/2018 1121   BUN 21 01/18/2018 1121   CREATININE 0.72 01/18/2018 1121   CALCIUM 10.0 01/18/2018 1121   GFRNONAA 77.21 03/21/2009 1447    Lipid Panel     Component Value Date/Time   CHOL 234 (H) 04/24/2013 1258   TRIG 82.0 04/24/2013 1258   HDL 84.70 04/24/2013 1258   CHOLHDL 3 04/24/2013 1258   VLDL 16.4 04/24/2013 1258   LDLCALC 133 (H) 04/24/2013 1258    CBC    Component Value Date/Time   WBC 5.7 01/18/2018 1121   RBC 3.99 01/18/2018 1121   HGB 14.7 01/18/2018 1121   HCT 43.7  01/18/2018 1121   PLT 232.0 01/18/2018 1121   MCV 109.6 Repeated and verified X2. (H) 01/18/2018 1121   MCHC 33.6 01/18/2018 1121   RDW 13.4 01/18/2018 1121   LYMPHSABS 2.3 08/19/2016 0852   MONOABS 0.6 08/19/2016 0852   EOSABS 0.7 08/19/2016 0852   BASOSABS 0.0 08/19/2016 0852    Hgb A1C No results found  for: HGBA1C      Assessment and Plan:  Left Side Facial Pressure, Ear Pain, Sore Throat:  Could be allergies, vs viral sinusitis vs tonsillitis vs COVID She really feels bad and feels like this is strep throat RX for Amoxil 500 mg TID x 10 days Ok to continue Ibuprofen Salt water gargles may be helpful  Return precautions discussed  Follow Up Instructions:    I discussed the assessment and treatment plan with the patient. The patient was provided an opportunity to ask questions and all were answered. The patient agreed with the plan and demonstrated an understanding of the instructions.   The patient was advised to call back or seek an in-person evaluation if the symptoms worsen or if the condition fails to improve as anticipated.     Webb Silversmith, NP

## 2018-07-27 ENCOUNTER — Other Ambulatory Visit: Payer: Self-pay

## 2018-07-28 MED ORDER — TIZANIDINE HCL 2 MG PO CAPS: 2.0000 mg | ORAL_CAPSULE | Freq: Every evening | ORAL | 1 refills | Status: DC | PRN

## 2018-07-28 NOTE — Telephone Encounter (Signed)
Last filled 05/17/18 #30 Last OV 01-18-18 Next OV 01-23-19 [facility] [place]

## 2018-07-29 ENCOUNTER — Other Ambulatory Visit: Payer: Self-pay | Admitting: Internal Medicine

## 2018-07-29 DIAGNOSIS — Z1231 Encounter for screening mammogram for malignant neoplasm of breast: Secondary | ICD-10-CM

## 2018-09-09 ENCOUNTER — Ambulatory Visit
Admission: RE | Admit: 2018-09-09 | Discharge: 2018-09-09 | Disposition: A | Discharge status: Home / Self Care | Payer: Medicare Other | Source: Ambulatory Visit | Attending: Internal Medicine | Admitting: Internal Medicine

## 2018-09-09 DIAGNOSIS — Z1231 Encounter for screening mammogram for malignant neoplasm of breast: Secondary | ICD-10-CM | POA: Diagnosis present

## 2018-10-11 ENCOUNTER — Other Ambulatory Visit: Payer: Self-pay | Admitting: Internal Medicine

## 2018-10-11 NOTE — Telephone Encounter (Signed)
Last filled 09-13-18 #30 Last OV 06-27-18 Next OV 01-23-19 [facility] [address]

## 2018-10-12 ENCOUNTER — Ambulatory Visit (INDEPENDENT_AMBULATORY_CARE_PROVIDER_SITE_OTHER): Payer: Medicare Other

## 2018-10-12 ENCOUNTER — Ambulatory Visit (INDEPENDENT_AMBULATORY_CARE_PROVIDER_SITE_OTHER): Payer: Medicare Other | Admitting: Podiatry

## 2018-10-12 ENCOUNTER — Other Ambulatory Visit: Payer: Self-pay

## 2018-10-12 ENCOUNTER — Encounter: Payer: Self-pay | Admitting: Podiatry

## 2018-10-12 VITALS — BP 117/74 | HR 93 | Resp 16

## 2018-10-12 DIAGNOSIS — M722 Plantar fascial fibromatosis: Secondary | ICD-10-CM | POA: Diagnosis not present

## 2018-10-12 MED ORDER — MELOXICAM 15 MG PO TABS: 15.0000 mg | ORAL_TABLET | Freq: Every day | ORAL | 3 refills | Status: DC

## 2018-10-12 MED ORDER — METHYLPREDNISOLONE 4 MG PO TBPK: ORAL_TABLET | ORAL | 0 refills | Status: DC

## 2018-10-12 NOTE — Progress Notes (Signed)
Subjective:  Patient ID: Jacqueline Meza, female    DOB: 1945-07-04,  MRN: [id number] HPI Chief Complaint  Patient presents with  . Foot Pain    Plantar heel left - aching x 6 weeks, AM pain, tried ice and sister's night splint-no help  . New Patient (Initial Visit)    73 y.o. female presents with the above complaint.   ROS: Denies fever chills nausea vomiting muscle aches pains calf pain back pain chest pain shortness of breath.  Past Medical History:  Diagnosis Date  . Endometriosis   . Hyperlipidemia   . Migraines    since childhood  . Raynaud's phenomenon    Past Surgical History:  Procedure Laterality Date  . ABDOMINAL HYSTERECTOMY  01/12/1993   endometriosis and worsening migraines  . CESAREAN SECTION  1988  . COLONOSCOPY  2005   Dr Bary Castilla  . DILATION AND CURETTAGE OF UTERUS     done after miscarriages twice    Current Outpatient Medications:  Marland Kitchen  Magnesium 100 MG CAPS, Take by mouth., Disp: , Rfl:  .  BIOTIN 5000 PO, Take 1 capsule by mouth daily., Disp: , Rfl:  .  CINNAMON PO, Take by mouth., Disp: , Rfl:  .  meloxicam (MOBIC) 15 MG tablet, Take 1 tablet (15 mg total) by mouth daily., Disp: 30 tablet, Rfl: 3 .  methylPREDNISolone (MEDROL DOSEPAK) 4 MG TBPK tablet, 6 day dose pack - take as directed, Disp: 21 tablet, Rfl: 0 .  Multiple Vitamin (MULTIVITAMIN) tablet, Take 1 tablet by mouth daily., Disp: , Rfl:  .  SUMAtriptan (IMITREX) 100 MG tablet, TAKE 1 TABLET(100 MG) BY MOUTH DAILY AS NEEDED FOR MIGRAINE, Disp: 10 tablet, Rfl: 7 .  topiramate (TOPAMAX) 100 MG tablet, Take 1 tablet (100 mg total) by mouth 2 (two) times daily., Disp: 60 tablet, Rfl: 11 .  vitamin B-12 (CYANOCOBALAMIN) 1000 MCG tablet, Take 2,500 mcg by mouth daily., Disp: , Rfl:   Allergies  Allergen Reactions  . Codeine Sulfate     REACTION: headache and nausea   Review of Systems Objective:   Vitals:   10/12/18 0955  BP: 117/74  Pulse: 93  Resp: 16    General: Well developed,  nourished, in no acute distress, alert and oriented x3   Dermatological: Skin is warm, dry and supple bilateral. Nails x 10 are well maintained; remaining integument appears unremarkable at this time. There are no open sores, no preulcerative lesions, no rash or signs of infection present.  Vascular: Dorsalis Pedis artery and Posterior Tibial artery pedal pulses are 2/4 bilateral with immedate capillary fill time. Pedal hair growth present. No varicosities and no lower extremity edema present bilateral.   Neruologic: Grossly intact via light touch bilateral. Vibratory intact via tuning fork bilateral. Protective threshold with Semmes Wienstein monofilament intact to all pedal sites bilateral. Patellar and Achilles deep tendon reflexes 2+ bilateral. No Babinski or clonus noted bilateral.   Musculoskeletal: No gross boney pedal deformities bilateral. No pain, crepitus, or limitation noted with foot and ankle range of motion bilateral. Muscular strength 5/5 in all groups tested bilateral.  Gait: Unassisted, Nonantalgic.    Radiographs:  Soft tissue increase in density at the plantar fashion calcaneal insertion site indicative of plantar fasciitis no acute abnormalities.  Normal osseous architecture.  Assessment & Plan:   Assessment: Plantar fasciitis left.  Plan: Discussed etiology pathology conservative versus surgical therapies.  Discussed appropriate shoe gear stretching exercises ice therapy and shoe gear modifications.  Injected her left heel with  20 mg Kenalog 5 mg Marcaine after sterile Betadine skin prep.  Tolerated procedure well without complications.  Placed in a plantar fascial brace she Artie has a night splint from her sister started her on a Medrol Dosepak to be followed by meloxicam.     Jacqueline Meza T. [place], [place]

## 2018-11-09 ENCOUNTER — Ambulatory Visit: Payer: Medicare Other | Admitting: Podiatry

## 2018-12-07 ENCOUNTER — Other Ambulatory Visit: Payer: Self-pay | Admitting: Internal Medicine

## 2019-01-03 ENCOUNTER — Other Ambulatory Visit: Payer: Self-pay | Admitting: Internal Medicine

## 2019-01-03 NOTE — Telephone Encounter (Signed)
Last filled 12-07-18 #30 Last OV 06-27-18 Next OV 01-23-19 [facility] [address] [facility] [employer]

## 2019-01-23 ENCOUNTER — Encounter: Payer: Self-pay | Admitting: Internal Medicine

## 2019-01-23 ENCOUNTER — Ambulatory Visit (INDEPENDENT_AMBULATORY_CARE_PROVIDER_SITE_OTHER): Payer: Medicare PPO | Admitting: Internal Medicine

## 2019-01-23 ENCOUNTER — Other Ambulatory Visit: Payer: Self-pay

## 2019-01-23 VITALS — BP 128/84 | HR 86 | Temp 96.0°F | Resp 18 | Ht 62.0 in | Wt 130.1 lb

## 2019-01-23 DIAGNOSIS — Z0000 Encounter for general adult medical examination without abnormal findings: Secondary | ICD-10-CM | POA: Diagnosis not present

## 2019-01-23 DIAGNOSIS — Z23 Encounter for immunization: Secondary | ICD-10-CM | POA: Diagnosis not present

## 2019-01-23 DIAGNOSIS — Z7189 Other specified counseling: Secondary | ICD-10-CM

## 2019-01-23 DIAGNOSIS — M722 Plantar fascial fibromatosis: Secondary | ICD-10-CM

## 2019-01-23 DIAGNOSIS — G43009 Migraine without aura, not intractable, without status migrainosus: Secondary | ICD-10-CM

## 2019-01-23 LAB — COMPREHENSIVE METABOLIC PANEL
ALT: 18 U/L (ref 0–35)
AST: 24 U/L (ref 0–37)
Albumin: 4.5 g/dL (ref 3.5–5.2)
Alkaline Phosphatase: 58 U/L (ref 39–117)
BUN: 24 mg/dL — ABNORMAL HIGH (ref 6–23)
CO2: 26 mEq/L (ref 19–32)
Calcium: 9.4 mg/dL (ref 8.4–10.5)
Chloride: 107 mEq/L (ref 96–112)
Creatinine, Ser: 0.71 mg/dL (ref 0.40–1.20)
GFR: 80.92 mL/min (ref >60.00)
Glucose, Bld: 98 mg/dL (ref 70–99)
Potassium: 4.7 mEq/L (ref 3.5–5.1)
Sodium: 141 mEq/L (ref 135–145)
Total Bilirubin: 0.6 mg/dL (ref 0.2–1.2)
Total Protein: 7.2 g/dL (ref 6.0–8.3)

## 2019-01-23 LAB — CBC
HCT: 42.7 % (ref 36.0–46.0)
Hemoglobin: 14.3 g/dL (ref 12.0–15.0)
MCHC: 33.5 g/dL (ref 30.0–36.0)
MCV: 109.6 fl — ABNORMAL HIGH (ref 78.0–100.0)
Platelets: 192 10*3/uL (ref 150.0–400.0)
RBC: 3.9 Mil/uL (ref 3.87–5.11)
RDW: 13.5 % (ref 11.5–15.5)
WBC: 7.7 10*3/uL (ref 4.0–10.5)

## 2019-01-23 MED ORDER — TOPIRAMATE 100 MG PO TABS: 100.0000 mg | ORAL_TABLET | Freq: Two times a day (BID) | ORAL | 3 refills | Status: DC

## 2019-01-23 NOTE — Progress Notes (Signed)
Subjective:    Patient ID: Jacqueline Meza, female    DOB: [birth year], 74 y.o.   MRN: [id number]  HPI  Here for Medicare wellness visit and follow up of chronic health condtions  This visit occurred during the SARS-CoV-2 public health emergency.  Safety protocols were in place, including screening questions prior to the visit, additional usage of staff PPE, and extensive cleaning of exam room while observing appropriate contact time as indicated for disinfecting solutions.   Reviewed form and advanced directives Reviewed other doctors No tobacco Still enjoys wine daily 3-4 glasses Still doing some house cleaning Home schooling grandkids 3 days per week---now they are in school Walks when she can---limited by plantar fasciitis Vision and hearing are fine No falls No depression or anhedonia Independent with instrumental ADLs No apparent memory issues  Still on topamax as prevention imitrex for acute attacks  Current Outpatient Medications on File Prior to Visit  Medication Sig Dispense Refill  . BIOTIN 5000 PO Take 1 capsule by mouth daily.    . meloxicam (MOBIC) 15 MG tablet Take 1 tablet (15 mg total) by mouth daily. 30 tablet 3  . methylPREDNISolone (MEDROL DOSEPAK) 4 MG TBPK tablet 6 day dose pack - take as directed 21 tablet 0  . Multiple Vitamin (MULTIVITAMIN) tablet Take 1 tablet by mouth daily.    . SUMAtriptan (IMITREX) 100 MG tablet TAKE 1 TABLET(100 MG) BY MOUTH DAILY AS NEEDED FOR MIGRAINE 10 tablet 7  . tizanidine (ZANAFLEX) 2 MG capsule TAKE 1 CAPSULE(2 MG) BY MOUTH AT BEDTIME AS NEEDED FOR MUSCLE SPASMS 30 capsule 1  . topiramate (TOPAMAX) 100 MG tablet Take 1 tablet (100 mg total) by mouth 2 (two) times daily. 60 tablet 11   No current facility-administered medications on file prior to visit.    Allergies  Allergen Reactions  . Codeine Sulfate     REACTION: headache and nausea    Past Medical History:  Diagnosis Date  . Endometriosis   . Hyperlipidemia    . Migraines    since childhood  . Raynaud's phenomenon     Past Surgical History:  Procedure Laterality Date  . ABDOMINAL HYSTERECTOMY  01/12/1993   endometriosis and worsening migraines  . CESAREAN SECTION  1988  . COLONOSCOPY  2005   Dr Bary Castilla  . DILATION AND CURETTAGE OF UTERUS     done after miscarriages twice    Family History  Problem Relation Age of Onset  . Migraines Sister   . Cancer Father        lymphoma  . Bone cancer Father   . Kidney failure Mother   . Migraines Daughter   . Cancer Paternal Uncle 87       colon  . Cancer Paternal Aunt        breast  . CAD Neg Hx   . Stroke Neg Hx   . Diabetes Neg Hx   . Breast cancer Neg Hx     Social History   Socioeconomic History  . Marital status: Divorced    Spouse name: Not on file  . Number of children: 1  . Years of education: Not on file  . Highest education level: Not on file  Occupational History  . Occupation: Retired--curriculum facilitator    Comment: [employer]  . Occupation: Printmaker part time--math intervention    Comment: retired  . Occupation: Microbiologist houses part time    Comment:    Tobacco Use  . Smoking status: Never Smoker  .  Smokeless tobacco: Never Used  Substance and Sexual Activity  . Alcohol use: Yes    Comment: regular wine/daily  . Drug use: No  . Sexual activity: Never  Other Topics Concern  . Not on file  Social History Narrative   No living will   Requests daughter Addy as health care POA.   Requests DNR after discussion --form done 04/22/12   No tube feedings if cognitively unaware   Social Determinants of Health   Financial Resource Strain:   . Difficulty of Paying Living Expenses: Not on file  Food Insecurity:   . Worried About Charity fundraiser in the Last Year: Not on file  . Ran Out of Food in the Last Year: Not on file  Transportation Needs:   . Lack of Transportation (Medical): Not on file  . Lack of Transportation (Non-Medical): Not on  file  Physical Activity:   . Days of Exercise per Week: Not on file  . Minutes of Exercise per Session: Not on file  Stress:   . Feeling of Stress : Not on file  Social Connections:   . Frequency of Communication with Friends and Family: Not on file  . Frequency of Social Gatherings with Friends and Family: Not on file  . Attends Religious Services: Not on file  . Active Member of Clubs or Organizations: Not on file  . Attends Archivist Meetings: Not on file  . Marital Status: Not on file  Intimate Partner Violence:   . Fear of Current or Ex-Partner: Not on file  . Emotionally Abused: Not on file  . Physically Abused: Not on file  . Sexually Abused: Not on file   Review of Systems Appetite is fine Weight up a few pounds since COVID Sleeps okay--never great. No daytime somnolence Wears seat belt Needs new mouthpiece for grinding. Teeth okay Bowels are fine--no blood. No heartburn or dysphagia Voids fine--no incontinence No suspicious skin lesions No chest pain, SOB, dizziness    Objective:   Physical Exam  Constitutional: She is oriented to person, place, and time. She appears well-developed. No distress.  HENT:  Mouth/Throat: Oropharynx is clear and moist. No oropharyngeal exudate.  Neck: No thyromegaly present.  Cardiovascular: Normal rate, regular rhythm, normal heart sounds and intact distal pulses. Exam reveals no gallop.  No murmur heard. Respiratory: Effort normal and breath sounds normal. No respiratory distress. She has no wheezes. She has no rales.  GI: Soft. There is no abdominal tenderness.  Musculoskeletal:        General: No tenderness or edema.  Lymphadenopathy:    She has no cervical adenopathy.  Neurological: She is alert and oriented to person, place, and time.  President--- "Dwaine Deter, [place]" [phone] [id number] Recall 3/3  Skin: No rash noted.  Psychiatric: She has a normal mood and affect. Her behavior is normal.            Assessment & Plan:

## 2019-01-23 NOTE — Progress Notes (Signed)
Hearing Screening   Method: Audiometry   125Hz  250Hz  500Hz  1000Hz  2000Hz  3000Hz  4000Hz  6000Hz  8000Hz   Right ear:   20 25 20  20     Left ear:   20 20 20  20       Visual Acuity Screening   Right eye Left eye Both eyes  Without correction: 20/20 20/30 20/20   With correction:

## 2019-01-23 NOTE — Addendum Note (Signed)
Addended by: Neta Ehlers on: 01/23/2019 11:22 AM   Modules accepted: Orders

## 2019-01-23 NOTE — Assessment & Plan Note (Signed)
I have personally reviewed the Medicare Annual Wellness questionnaire and have noted 1. The patient's medical and social history 2. Their use of alcohol, tobacco or illicit drugs 3. Their current medications and supplements 4. The patient's functional ability including ADL's, fall risks, home safety risks and hearing or visual             impairment. 5. Diet and physical activities 6. Evidence for depression or mood disorders  The patients weight, height, BMI and visual acuity have been recorded in the chart I have made referrals, counseling and provided education to the patient based review of the above and I have provided the pt with a written personalized care plan for preventive services.  I have provided you with a copy of your personalized plan for preventive services. Please take the time to review along with your updated medication list.  Td today--she knows it is not covered Yearly flu vaccine Consider colon/FIT in 2025 Yearly mammogram through Dr Bary Castilla Discussed fitness Shingrix at pharmacy sometime

## 2019-01-23 NOTE — Assessment & Plan Note (Signed)
Better now Had been using meloxicam

## 2019-01-23 NOTE — Assessment & Plan Note (Signed)
Doing okay on the topamax prophylaxis Very weather dependent--- 1 per week to as much as several Uses the imitrex prn Also will use the meloxicam

## 2019-01-23 NOTE — Assessment & Plan Note (Signed)
See social history °Has DNR °

## 2019-02-01 ENCOUNTER — Other Ambulatory Visit: Payer: Self-pay

## 2019-02-01 NOTE — Telephone Encounter (Signed)
Last refilled on 06/07/2018 #10 with 7 refills.  LOV 01/23/2019 for CPE Next appointment on 01/26/20

## 2019-02-02 MED ORDER — SUMATRIPTAN SUCCINATE 100 MG PO TABS: ORAL_TABLET | ORAL | 11 refills | Status: DC

## 2019-02-06 ENCOUNTER — Other Ambulatory Visit: Payer: Self-pay | Admitting: Podiatry

## 2019-02-06 NOTE — Telephone Encounter (Signed)
Left message informing [facility] of the meloxicam refill.

## 2019-02-07 DIAGNOSIS — M461 Sacroiliitis, not elsewhere classified: Secondary | ICD-10-CM | POA: Diagnosis not present

## 2019-02-07 DIAGNOSIS — M62838 Other muscle spasm: Secondary | ICD-10-CM | POA: Diagnosis not present

## 2019-02-07 DIAGNOSIS — M545 Low back pain: Secondary | ICD-10-CM | POA: Diagnosis not present

## 2019-02-07 DIAGNOSIS — M9904 Segmental and somatic dysfunction of sacral region: Secondary | ICD-10-CM | POA: Diagnosis not present

## 2019-02-07 DIAGNOSIS — G44209 Tension-type headache, unspecified, not intractable: Secondary | ICD-10-CM | POA: Diagnosis not present

## 2019-02-07 DIAGNOSIS — M9901 Segmental and somatic dysfunction of cervical region: Secondary | ICD-10-CM | POA: Diagnosis not present

## 2019-02-07 DIAGNOSIS — M5413 Radiculopathy, cervicothoracic region: Secondary | ICD-10-CM | POA: Diagnosis not present

## 2019-02-07 DIAGNOSIS — M9903 Segmental and somatic dysfunction of lumbar region: Secondary | ICD-10-CM | POA: Diagnosis not present

## 2019-02-08 DIAGNOSIS — R634 Abnormal weight loss: Secondary | ICD-10-CM | POA: Diagnosis not present

## 2019-02-09 DIAGNOSIS — M9903 Segmental and somatic dysfunction of lumbar region: Secondary | ICD-10-CM | POA: Diagnosis not present

## 2019-02-09 DIAGNOSIS — M5413 Radiculopathy, cervicothoracic region: Secondary | ICD-10-CM | POA: Diagnosis not present

## 2019-02-09 DIAGNOSIS — M9901 Segmental and somatic dysfunction of cervical region: Secondary | ICD-10-CM | POA: Diagnosis not present

## 2019-02-09 DIAGNOSIS — G44209 Tension-type headache, unspecified, not intractable: Secondary | ICD-10-CM | POA: Diagnosis not present

## 2019-02-09 DIAGNOSIS — M545 Low back pain: Secondary | ICD-10-CM | POA: Diagnosis not present

## 2019-02-09 DIAGNOSIS — M62838 Other muscle spasm: Secondary | ICD-10-CM | POA: Diagnosis not present

## 2019-02-09 DIAGNOSIS — M461 Sacroiliitis, not elsewhere classified: Secondary | ICD-10-CM | POA: Diagnosis not present

## 2019-02-09 DIAGNOSIS — M9904 Segmental and somatic dysfunction of sacral region: Secondary | ICD-10-CM | POA: Diagnosis not present

## 2019-02-10 NOTE — Telephone Encounter (Signed)
Spoke to pt. She is going to use a Good Rx card and get it at [facility] for [phone]. That way she and I do not have to deal with the insurance.

## 2019-02-10 NOTE — Telephone Encounter (Signed)
Please find out if we need to do a PA or use an alternative medication (can't imagine that since sumatriptan is usually the cheapest). Perhaps it is a quantity limit issue?

## 2019-02-13 DIAGNOSIS — M5413 Radiculopathy, cervicothoracic region: Secondary | ICD-10-CM | POA: Diagnosis not present

## 2019-02-13 DIAGNOSIS — G44209 Tension-type headache, unspecified, not intractable: Secondary | ICD-10-CM | POA: Diagnosis not present

## 2019-02-13 DIAGNOSIS — M9904 Segmental and somatic dysfunction of sacral region: Secondary | ICD-10-CM | POA: Diagnosis not present

## 2019-02-13 DIAGNOSIS — M545 Low back pain: Secondary | ICD-10-CM | POA: Diagnosis not present

## 2019-02-13 DIAGNOSIS — M62838 Other muscle spasm: Secondary | ICD-10-CM | POA: Diagnosis not present

## 2019-02-13 DIAGNOSIS — M9901 Segmental and somatic dysfunction of cervical region: Secondary | ICD-10-CM | POA: Diagnosis not present

## 2019-02-13 DIAGNOSIS — M9903 Segmental and somatic dysfunction of lumbar region: Secondary | ICD-10-CM | POA: Diagnosis not present

## 2019-02-13 DIAGNOSIS — M461 Sacroiliitis, not elsewhere classified: Secondary | ICD-10-CM | POA: Diagnosis not present

## 2019-02-19 ENCOUNTER — Ambulatory Visit: Payer: Medicare PPO | Attending: Internal Medicine

## 2019-02-19 ENCOUNTER — Other Ambulatory Visit: Payer: Self-pay

## 2019-02-19 ENCOUNTER — Ambulatory Visit: Payer: Medicare PPO

## 2019-02-19 DIAGNOSIS — Z23 Encounter for immunization: Secondary | ICD-10-CM | POA: Insufficient documentation

## 2019-02-19 NOTE — Progress Notes (Signed)
   Covid-19 Vaccination Clinic  Name:  Jacqueline Meza    MRN: [id number] DOB: 1945/01/25  02/19/2019  [name] was observed post Covid-19 immunization for 15 minutes without incidence. She was provided with Vaccine Information Sheet and instruction to access the V-Safe system.   [name] was instructed to call 911 with any severe reactions post vaccine: Marland Kitchen Difficulty breathing  . Swelling of your face and throat  . A fast heartbeat  . A bad rash all over your body  . Dizziness and weakness    Immunizations Administered    Name Date Dose VIS Date Route   Pfizer COVID-19 Vaccine 02/19/2019  8:56 AM 0.3 mL 12/23/2018 Intramuscular   Manufacturer: [place]   Lot: [id number]   [place]: [id number]

## 2019-02-27 DIAGNOSIS — M545 Low back pain: Secondary | ICD-10-CM | POA: Diagnosis not present

## 2019-02-27 DIAGNOSIS — G44209 Tension-type headache, unspecified, not intractable: Secondary | ICD-10-CM | POA: Diagnosis not present

## 2019-02-27 DIAGNOSIS — M9903 Segmental and somatic dysfunction of lumbar region: Secondary | ICD-10-CM | POA: Diagnosis not present

## 2019-02-27 DIAGNOSIS — M9901 Segmental and somatic dysfunction of cervical region: Secondary | ICD-10-CM | POA: Diagnosis not present

## 2019-02-27 DIAGNOSIS — M461 Sacroiliitis, not elsewhere classified: Secondary | ICD-10-CM | POA: Diagnosis not present

## 2019-02-27 DIAGNOSIS — M62838 Other muscle spasm: Secondary | ICD-10-CM | POA: Diagnosis not present

## 2019-02-27 DIAGNOSIS — M5413 Radiculopathy, cervicothoracic region: Secondary | ICD-10-CM | POA: Diagnosis not present

## 2019-02-27 DIAGNOSIS — M9904 Segmental and somatic dysfunction of sacral region: Secondary | ICD-10-CM | POA: Diagnosis not present

## 2019-03-06 ENCOUNTER — Ambulatory Visit: Payer: Medicare PPO

## 2019-03-13 DIAGNOSIS — L814 Other melanin hyperpigmentation: Secondary | ICD-10-CM | POA: Diagnosis not present

## 2019-03-13 DIAGNOSIS — Z85828 Personal history of other malignant neoplasm of skin: Secondary | ICD-10-CM | POA: Diagnosis not present

## 2019-03-13 DIAGNOSIS — D229 Melanocytic nevi, unspecified: Secondary | ICD-10-CM | POA: Diagnosis not present

## 2019-03-13 DIAGNOSIS — Z1283 Encounter for screening for malignant neoplasm of skin: Secondary | ICD-10-CM | POA: Diagnosis not present

## 2019-03-13 DIAGNOSIS — D225 Melanocytic nevi of trunk: Secondary | ICD-10-CM | POA: Diagnosis not present

## 2019-03-13 DIAGNOSIS — L578 Other skin changes due to chronic exposure to nonionizing radiation: Secondary | ICD-10-CM | POA: Diagnosis not present

## 2019-03-13 DIAGNOSIS — L820 Inflamed seborrheic keratosis: Secondary | ICD-10-CM | POA: Diagnosis not present

## 2019-03-13 DIAGNOSIS — L821 Other seborrheic keratosis: Secondary | ICD-10-CM | POA: Diagnosis not present

## 2019-03-13 DIAGNOSIS — L570 Actinic keratosis: Secondary | ICD-10-CM | POA: Diagnosis not present

## 2019-03-15 ENCOUNTER — Ambulatory Visit: Payer: Medicare PPO | Attending: Internal Medicine

## 2019-03-15 DIAGNOSIS — Z23 Encounter for immunization: Secondary | ICD-10-CM | POA: Insufficient documentation

## 2019-03-15 NOTE — Progress Notes (Signed)
   Covid-19 Vaccination Clinic  Name:  Jacqueline Meza    MRN: [id number] DOB: [birth year]  03/15/2019  [name] was observed post Covid-19 immunization for 15 minutes without incident. She was provided with Vaccine Information Sheet and instruction to access the V-Safe system.   [name] was instructed to call 911 with any severe reactions post vaccine: Marland Kitchen Difficulty breathing  . Swelling of face and throat  . A fast heartbeat  . A bad rash all over body  . Dizziness and weakness   Immunizations Administered    Name Date Dose VIS Date Route   Pfizer COVID-19 Vaccine 03/15/2019 11:03 AM 0.3 mL 12/23/2018 Intramuscular   Manufacturer: [place]   Lot: [id number]   [place]: [id number]

## 2019-04-13 DIAGNOSIS — E6609 Other obesity due to excess calories: Secondary | ICD-10-CM | POA: Diagnosis not present

## 2019-05-08 DIAGNOSIS — M5413 Radiculopathy, cervicothoracic region: Secondary | ICD-10-CM | POA: Diagnosis not present

## 2019-05-08 DIAGNOSIS — M9903 Segmental and somatic dysfunction of lumbar region: Secondary | ICD-10-CM | POA: Diagnosis not present

## 2019-05-08 DIAGNOSIS — M461 Sacroiliitis, not elsewhere classified: Secondary | ICD-10-CM | POA: Diagnosis not present

## 2019-05-08 DIAGNOSIS — M545 Low back pain: Secondary | ICD-10-CM | POA: Diagnosis not present

## 2019-05-08 DIAGNOSIS — M9901 Segmental and somatic dysfunction of cervical region: Secondary | ICD-10-CM | POA: Diagnosis not present

## 2019-05-08 DIAGNOSIS — G44209 Tension-type headache, unspecified, not intractable: Secondary | ICD-10-CM | POA: Diagnosis not present

## 2019-05-08 DIAGNOSIS — M62838 Other muscle spasm: Secondary | ICD-10-CM | POA: Diagnosis not present

## 2019-05-08 DIAGNOSIS — M9904 Segmental and somatic dysfunction of sacral region: Secondary | ICD-10-CM | POA: Diagnosis not present

## 2019-05-16 MED ORDER — UBRELVY 50 MG PO TABS: 50.0000 mg | ORAL_TABLET | Freq: Every day | ORAL | 1 refills | Status: DC | PRN

## 2019-05-18 DIAGNOSIS — M9901 Segmental and somatic dysfunction of cervical region: Secondary | ICD-10-CM | POA: Diagnosis not present

## 2019-05-18 DIAGNOSIS — M5414 Radiculopathy, thoracic region: Secondary | ICD-10-CM | POA: Diagnosis not present

## 2019-05-19 DIAGNOSIS — M9901 Segmental and somatic dysfunction of cervical region: Secondary | ICD-10-CM | POA: Diagnosis not present

## 2019-05-19 DIAGNOSIS — M5414 Radiculopathy, thoracic region: Secondary | ICD-10-CM | POA: Diagnosis not present

## 2019-05-22 DIAGNOSIS — M5414 Radiculopathy, thoracic region: Secondary | ICD-10-CM | POA: Diagnosis not present

## 2019-05-22 DIAGNOSIS — M9901 Segmental and somatic dysfunction of cervical region: Secondary | ICD-10-CM | POA: Diagnosis not present

## 2019-05-24 MED ORDER — TIZANIDINE HCL 2 MG PO CAPS: 2.0000 mg | ORAL_CAPSULE | Freq: Every evening | ORAL | 1 refills | Status: DC | PRN

## 2019-05-24 NOTE — Telephone Encounter (Signed)
See if they have an alternative that they cover. Sumatriptan is not working anymore

## 2019-05-24 NOTE — Telephone Encounter (Signed)
Can you see if we needed a PA for ubrelvy?

## 2019-05-24 NOTE — Telephone Encounter (Signed)
Insurance denied the [place].

## 2019-05-25 DIAGNOSIS — M9901 Segmental and somatic dysfunction of cervical region: Secondary | ICD-10-CM | POA: Diagnosis not present

## 2019-05-25 DIAGNOSIS — M5414 Radiculopathy, thoracic region: Secondary | ICD-10-CM | POA: Diagnosis not present

## 2019-05-29 NOTE — Telephone Encounter (Signed)
New PA came back denied as well. States the pt has to try and fail naratriptan or rizatriptan.

## 2019-05-29 NOTE — Telephone Encounter (Signed)
Find out if she wants to try one of those

## 2019-06-01 MED ORDER — NARATRIPTAN HCL 2.5 MG PO TABS: 2.5000 mg | ORAL_TABLET | ORAL | 3 refills | Status: DC | PRN

## 2019-06-05 DIAGNOSIS — M5414 Radiculopathy, thoracic region: Secondary | ICD-10-CM | POA: Diagnosis not present

## 2019-06-05 DIAGNOSIS — M9901 Segmental and somatic dysfunction of cervical region: Secondary | ICD-10-CM | POA: Diagnosis not present

## 2019-06-07 ENCOUNTER — Telehealth: Payer: Self-pay

## 2019-06-07 NOTE — Telephone Encounter (Signed)
Please look back at previous encounters. PA was denied for the Ubrelvy. Pt has to try other medications 1st. The new medication was sent in. Nothing further needed for this. No appeal will be done.

## 2019-06-07 NOTE — Telephone Encounter (Signed)
Jacqueline Meza with Cover My Meds left v/m  Wanting to know if cover my meds fax was received; ref # [id number].

## 2019-07-14 NOTE — Telephone Encounter (Signed)
Can we try the ubelvry now since she has failed the 2 alternatives they gave

## 2019-07-19 DIAGNOSIS — G43119 Migraine with aura, intractable, without status migrainosus: Secondary | ICD-10-CM

## 2019-09-04 MED ORDER — TIZANIDINE HCL 2 MG PO CAPS: 2.0000 mg | ORAL_CAPSULE | Freq: Every evening | ORAL | 1 refills | Status: DC | PRN

## 2019-09-25 NOTE — Progress Notes (Signed)
[facility]    Provider:  Dr Jaynee Eagles Requesting Provider: Venia Carbon, MD Primary Care Provider:  Venia Carbon, MD  CC:  migraines  HPI:  Jacqueline Meza is a 74 y.o. female here as requested by Venia Carbon, MD for intractable migraines. PMHx migraines, HLD, I reviewed Dr. Alla German notes, does not appear as though she has been seen since last January in the office, at that time she was drinking 3 to 4 glasses of wine daily, homeschooling her grandchildren walking when she can limited by plantar fasciitis, no vision or hearing changes, no falls, no depression, no apparent memory issues, he did mention that she was on Topamax as prevention for migraines and Imitrex for acute attacks.  Physical examination was normal.  Patient emailed Dr. Silvio Pate in July stating that she wanted to be referred to a migraine specialist and neurologist, she is seen several before very times in her life, she reported meds she is taking did not seem to be working and headaches are more frequent sometimes twice a week, she was referred here for further evaluation and treatment.  I do not see any imaging of the brain in epic and neither in "Care Everywhere".  I did find a note from 2019 from Dr. Kara Pacer which discussed problems with migraines, at that time she was having a headache every other day for about 2 months, insurance stopped paying for her Relpax, Maxalt not really effective, and reporting photophobia and sonophobia, no classic aura, Advil at times helps but Elavil in the past did not help.  Patient is here alone and reports she has had migraines since the age of 67.  Migraines are worsening.  Insurance will not pay for Relpax, she was on it for many years and it helped, she tried sumatriptan hand, she still able to take it because of good Rx, naratriptan does not help, she is tried Topamax and multiple other medications, Roselyn Meier was not approved by insurance. She is having migraines at  least 8-10 days a month, migraines are moderately severe to severe, more than 15 days a month of headaches ongoing longer than a year at this frequency and severity, Migraines can last 12-14 hours and sumatriptan may help. No aura. No medication overuse. She is nocturnal migraines, waking her up, migraines are unilateral, pulsating, pounding, throbbing, nausea, light sensitivity, sound/smell sensitivity, movement makes it worse. Vision changes (not an aura) She takes medication in the middle of the night. They continue during the day as well. She grinds at night and wears a mouth guard, no snoring, no problems with feeling tired, she wakes frequently however due to pain, but no napping during. No medication overuse. No aura. No other focal neurologic deficits, associated symptoms, inciting events or modifiable factors.  From a thorough review of records, medications tried in the past that can be used in migraine management include: Topamax, Elavil, Relpax (insurance stopped paying), Maxalt not really effective, meloxicam, Medrol Dosepak, Amerge, tizanidine, Ubrelvy, magnesium caps, propranolol, verapamil, depakote  Reviewed notes, labs and imaging from outside physicians, which showed: see above  Review of Systems: Patient complains of symptoms per HPI as well as the following symptoms: headaches. Pertinent negatives and positives per HPI. All others negative.   Social History   Socioeconomic History  . Marital status: Divorced    Spouse name: Not on file  . Number of children: 1  . Years of education: Not on file  . Highest education level: Not on file  Occupational  History  . Occupation: Retired--curriculum facilitator    Comment: [employer]  . Occupation: Printmaker part time--math intervention    Comment: retired  . Occupation: Microbiologist houses part time    Comment:    Tobacco Use  . Smoking status: Never Smoker  . Smokeless tobacco: Never Used  Substance and Sexual  Activity  . Alcohol use: Yes    Alcohol/week: 7.0 standard drinks    Types: 7 Standard drinks or equivalent per week    Comment: 1 drink a night, wine or vodka sometimes  . Drug use: No  . Sexual activity: Never  Other Topics Concern  . Not on file  Social History Narrative   No living will   Requests daughter Addy as health care POA.   Requests DNR after discussion --form done 04/22/12   No tube feedings if cognitively unaware         Update 09/26/2019   Lives alone    Right handed   Caffeine: 1 cup/day   Social Determinants of Health   Financial Resource Strain:   . Difficulty of Paying Living Expenses: Not on file  Food Insecurity:   . Worried About Charity fundraiser in the Last Year: Not on file  . Ran Out of Food in the Last Year: Not on file  Transportation Needs:   . Lack of Transportation (Medical): Not on file  . Lack of Transportation (Non-Medical): Not on file  Physical Activity:   . Days of Exercise per Week: Not on file  . Minutes of Exercise per Session: Not on file  Stress:   . Feeling of Stress : Not on file  Social Connections:   . Frequency of Communication with Friends and Family: Not on file  . Frequency of Social Gatherings with Friends and Family: Not on file  . Attends Religious Services: Not on file  . Active Member of Clubs or Organizations: Not on file  . Attends Archivist Meetings: Not on file  . Marital Status: Not on file  Intimate Partner Violence:   . Fear of Current or Ex-Partner: Not on file  . Emotionally Abused: Not on file  . Physically Abused: Not on file  . Sexually Abused: Not on file    Family History  Problem Relation Age of Onset  . Migraines Sister   . Cancer Father        lymphoma  . Bone cancer Father   . Migraines Father   . Kidney failure Mother   . Migraines Daughter   . Cancer Paternal Uncle 36       colon  . Cancer Paternal Aunt        breast  . Migraines Paternal Aunt   . CAD Neg Hx   .  Stroke Neg Hx   . Diabetes Neg Hx   . Breast cancer Neg Hx     Past Medical History:  Diagnosis Date  . Endometriosis   . Hyperlipidemia   . Migraines    since childhood  . Raynaud's phenomenon     Patient Active Problem List   Diagnosis Date Noted  . Plantar fasciitis 01/23/2019  . Chest wall tenderness 12/29/2017  . Advance directive discussed with patient 06/04/2014  . Hyperlipidemia   . Routine general medical examination at a health care facility 04/11/2010  . Chronic migraine without aura without status migrainosus, not intractable 10/27/2007    Past Surgical History:  Procedure Laterality Date  . ABDOMINAL HYSTERECTOMY  01/12/1993   endometriosis  and worsening migraines  . CESAREAN SECTION  1988  . COLONOSCOPY  2005   Dr Bary Castilla  . DILATION AND CURETTAGE OF UTERUS     done after miscarriages twice    Current Outpatient Medications  Medication Sig Dispense Refill  . BIOTIN 5000 PO Take 1 capsule by mouth daily.    . Multiple Vitamin (MULTIVITAMIN) tablet Take 1 tablet by mouth daily.    . naratriptan (AMERGE) 2.5 MG tablet Take 1 tablet (2.5 mg total) by mouth as needed for migraine. Take one (1) tablet at onset of headache; if returns or does not resolve, may repeat after 4 hours; do not exceed five (5) mg in 24 hours. 10 tablet 3  . tizanidine (ZANAFLEX) 2 MG capsule Take 1 capsule (2 mg total) by mouth at bedtime as needed for muscle spasms. 30 capsule 1  . topiramate (TOPAMAX) 100 MG tablet Take 1 tablet (100 mg total) by mouth 2 (two) times daily. 180 tablet 3  . rizatriptan (MAXALT-MLT) 10 MG disintegrating tablet Take 1 tablet (10 mg total) by mouth as needed for migraine. May repeat in 2 hours if needed 9 tablet 11   No current facility-administered medications for this visit.    Allergies as of 09/26/2019 - Review Complete 09/26/2019  Allergen Reaction Noted  . Codeine sulfate      Vitals: BP 119/75 (BP Location: Right Arm, Patient Position:  Sitting)   Pulse 82   Ht 5\' 2"  (1.575 m)   Wt 123 lb (55.8 kg)   BMI 22.50 kg/m  Last Weight:  Wt Readings from Last 1 Encounters:  09/26/19 123 lb (55.8 kg)   Last Height:   Ht Readings from Last 1 Encounters:  09/26/19 5\' 2"  (1.575 m)     Physical exam: Exam: Gen: NAD, conversant, well nourised, well groomed                     CV: RRR, no MRG. No Carotid Bruits. No peripheral edema, warm, nontender Eyes: Conjunctivae clear without exudates or hemorrhage  Neuro: Detailed Neurologic Exam  Speech:    Speech is normal; fluent and spontaneous with normal comprehension.  Cognition:    The patient is oriented to person, place, and time;     recent and remote memory intact;     language fluent;     normal attention, concentration,     fund of knowledge Cranial Nerves:    The pupils are equal, round, and reactive to light. The fundi are normal and spontaneous venous pulsations are present. Visual fields are full to finger confrontation. Extraocular movements are intact. Trigeminal sensation is intact and the muscles of mastication are normal. The face is symmetric. The palate elevates in the midline. Hearing intact. Voice is normal. Shoulder shrug is normal. The tongue has normal motion without fasciculations.   Coordination:    Normal finger to nose   Gait:    Normal native gait  Motor Observation:    No asymmetry, no atrophy, and no involuntary movements noted. Tone:    Normal muscle tone.    Posture:    Posture is normal. normal erect    Strength:    Strength is V/V in the upper and lower limbs.      Sensation: intact to LT     Reflex Exam:  DTR's:    Deep tendon reflexes in the upper and lower extremities are symmetrical bilaterally.   Toes:    The toes are downgoing bilaterally.  Clonus:    Clonus is absent.    Assessment/Plan: Lovely patient with chronic intractable headaches likely due to her chronic migraines however due to concerning symptoms she  needs a thorough evaluation including an MRI of the brain which she has never had.  MRI of the brain: Due to positional and nocturnal headahes, worsening headaches in a 74 year old:  to look for space occupying mass, tumors, chiari or intracranial hypertension (pseudotumor) or other etiologies  She is nocturnal migraines, waking her up, unilateral, pulsating, pounding, throbbing, nausea, light sensitivity, sound/smell sensitivity, movement makes it worse. She takes medication in the middle of the night. They continue during the day as well. She grinds at night and wears a mouth guard, no snoring, no problems with feeling tired, she wakes frequently however due to pain, but no napping during: may consider sleep evaluation in the future  MRI of the brain Blood work Discussed future use of Emgality one shot monthly for preventative * botox for prevention Acute: Rizatriptan. Please take one tablet at the onset of your headache. If it does not improve the symptoms please take one additional tablet. Do not take more then 2 tablets in 24hrs. Do not take use more then 2 to 3 times in a week.  Orders Placed This Encounter  Procedures  . MR BRAIN W WO CONTRAST  . TSH  . Basic Metabolic Panel   Meds ordered this encounter  Medications  . rizatriptan (MAXALT-MLT) 10 MG disintegrating tablet    Sig: Take 1 tablet (10 mg total) by mouth as needed for migraine. May repeat in 2 hours if needed    Dispense:  9 tablet    Refill:  11  . DISCONTD: Galcanezumab-gnlm (EMGALITY) 120 MG/ML SOAJ    Sig: Inject 120 mg into the skin every 30 (thirty) days.    Dispense:  4 mL    Refill:  0    Discussed: To prevent or relieve headaches, try the following: Cool Compress. Lie down and place a cool compress on your head.  Avoid headache triggers. If certain foods or odors seem to have triggered your migraines in the past, avoid them. A headache diary might help you identify triggers.  Include physical activity in  your daily routine. Try a daily walk or other moderate aerobic exercise.  Manage stress. Find healthy ways to cope with the stressors, such as delegating tasks on your to-do list.  Practice relaxation techniques. Try deep breathing, yoga, massage and visualization.  Eat regularly. Eating regularly scheduled meals and maintaining a healthy diet might help prevent headaches. Also, drink plenty of fluids.  Follow a regular sleep schedule. Sleep deprivation might contribute to headaches Consider biofeedback. With this mind-body technique, you learn to control certain bodily functions -- such as muscle tension, heart rate and blood pressure -- to prevent headaches or reduce headache pain.    Proceed to emergency room if you experience new or worsening symptoms or symptoms do not resolve, if you have new neurologic symptoms or if headache is severe, or for any concerning symptom.   Provided education and documentation from American headache Society toolbox including articles on: chronic migraine medication overuse headache, chronic migraines, prevention of migraines, behavioral and other nonpharmacologic treatments for headache.  Cc: Venia Carbon, MD,    Sarina Ill, MD  [facility] [address] [place], [place] [address]  Phone [phone] Fax [phone]

## 2019-09-26 ENCOUNTER — Telehealth: Payer: Self-pay | Admitting: Neurology

## 2019-09-26 ENCOUNTER — Encounter: Payer: Self-pay | Admitting: Neurology

## 2019-09-26 ENCOUNTER — Ambulatory Visit: Payer: Medicare PPO | Admitting: Neurology

## 2019-09-26 VITALS — BP 119/75 | HR 82 | Ht 62.0 in | Wt 123.0 lb

## 2019-09-26 DIAGNOSIS — G43709 Chronic migraine without aura, not intractable, without status migrainosus: Secondary | ICD-10-CM | POA: Diagnosis not present

## 2019-09-26 DIAGNOSIS — R51 Headache with orthostatic component, not elsewhere classified: Secondary | ICD-10-CM | POA: Diagnosis not present

## 2019-09-26 DIAGNOSIS — R519 Headache, unspecified: Secondary | ICD-10-CM

## 2019-09-26 DIAGNOSIS — H539 Unspecified visual disturbance: Secondary | ICD-10-CM | POA: Diagnosis not present

## 2019-09-26 MED ORDER — EMGALITY 120 MG/ML ~~LOC~~ SOAJ: 120.0000 mg | SUBCUTANEOUS | 0 refills | Status: DC

## 2019-09-26 MED ORDER — RIZATRIPTAN BENZOATE 10 MG PO TBDP: 10.0000 mg | ORAL_TABLET | ORAL | 11 refills | Status: DC | PRN

## 2019-09-26 NOTE — Telephone Encounter (Signed)
Please initiate botox for migraine protocol for patient thanks. She can come to me for the first botox and then I will transition her to an NP

## 2019-09-26 NOTE — Telephone Encounter (Signed)
Initiate botox protocol. Can she be on cgrp, will insurance allow both? G43.709. Firstinjection can be with me and then I will send her to Amy or Jinny Blossom but first with me thanks.

## 2019-09-26 NOTE — Telephone Encounter (Signed)
Botox charge sheet completed. Pt will need to sign consent at her first injection appointment.

## 2019-09-26 NOTE — Telephone Encounter (Signed)
Filled out [facility] PA form for Botox. Gave to MD to sign.

## 2019-09-26 NOTE — Patient Instructions (Addendum)
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 Please take one tablet at the onset of your headache. If it does not improve the symptoms please take one additional tablet. Do not take more then 2 tablets in 24hrs. Do not take use more then 2 to 3 times in a week.  OnabotulinumtoxinA injection (Medical Use) What is this medicine? ONABOTULINUMTOXINA (o na BOTT you lye num tox in eh) is a neuro-muscular blocker. This medicine is used to treat crossed eyes, eyelid spasms, severe neck muscle spasms, ankle and toe muscle spasms, and elbow, wrist, and finger muscle spasms. It is also used to treat excessive underarm sweating, to prevent chronic migraine headaches, and to treat loss of bladder control due to neurologic conditions such as multiple sclerosis or spinal cord injury. This medicine may be used for other purposes; ask your health care provider or pharmacist if you have questions. COMMON BRAND NAME(S): Botox What should I tell my health care provider before I take this medicine? They need to know if you have any of these conditions:  breathing problems  cerebral palsy spasms  difficulty urinating  heart problems  history of surgery where this medicine is going to be used  infection at the site where this medicine is going to be used  myasthenia gravis or other neurologic disease  nerve or muscle disease  surgery plans  take medicines that treat or prevent blood clots  thyroid problems  an unusual or allergic reaction to botulinum toxin, albumin, other medicines, foods, dyes, or preservatives  pregnant or trying to get pregnant  breast-feeding How should I use this medicine? This medicine is for injection into a muscle. It is given by a health care professional in a hospital or clinic setting. Talk to your pediatrician regarding the use of this medicine in children. While this drug may be prescribed for  children as young as 4 years old for selected conditions, precautions do apply. Overdosage: If you think you have taken too much of this medicine contact a poison control center or emergency room at once. NOTE: This medicine is only for you. Do not share this medicine with others. What if I miss a dose? This does not apply. What may interact with this medicine?  aminoglycoside antibiotics like gentamicin, neomycin, tobramycin  muscle relaxants  other botulinum toxin injections This list may not describe all possible interactions. Give your health care provider a list of all the medicines, herbs, non-prescription drugs, or dietary supplements you use. Also tell them if you smoke, drink alcohol, or use illegal drugs. Some items may interact with your medicine. What should I watch for while using this medicine? Visit your doctor for regular check ups. This medicine will cause weakness in the muscle where it is injected. Tell your doctor if you feel unusually weak in other muscles. Get medical help right away if you have problems with breathing, swallowing, or talking. This medicine might make your eyelids droop or make you see blurry or double. If you have weak muscles or trouble seeing do not drive a car, use machinery, or do other dangerous activities. This medicine contains albumin from human blood. It may be possible to pass an infection in this medicine, but no cases have been reported. Talk to your doctor about the risks and benefits of this medicine. If your activities have been limited by your condition, go back to your regular routine slowly after treatment with this medicine. What side  effects may I notice from receiving this medicine? Side effects that you should report to your doctor or health care professional as soon as possible:  allergic reactions like skin rash, itching or hives, swelling of the face, lips, or tongue  breathing problems  changes in vision  chest pain or  tightness  eye irritation, pain  fast, irregular heartbeat  infection  numbness  speech problems  swallowing problems  unusual weakness Side effects that usually do not require medical attention (report to your doctor or health care professional if they continue or are bothersome):  bruising or pain at site where injected  drooping eyelid  dry eyes or mouth  headache  muscles aches, pains  sensitivity to light  tearing This list may not describe all possible side effects. Call your doctor for medical advice about side effects. You may report side effects to FDA at 1-800-FDA-1088. Where should I keep my medicine? This drug is given in a hospital or clinic and will not be stored at home. NOTE: This sheet is a summary. It may not cover all possible information. If you have questions about this medicine, talk to your doctor, pharmacist, or health care provider.  2020 Elsevier/Gold Standard (2017-07-05 14:21:42) Rizatriptan disintegrating tablets What is this medicine? RIZATRIPTAN (rye za TRIP tan) is used to treat migraines with or without aura. An aura is a strange feeling or visual disturbance that warns you of an attack. It is not used to prevent migraines. This medicine may be used for other purposes; ask your health care provider or pharmacist if you have questions. COMMON BRAND NAME(S): Maxalt-MLT What should I tell my health care provider before I take this medicine? They need to know if you have any of these conditions:  cigarette smoker  circulation problems in fingers and toes  diabetes  heart disease  high blood pressure  high cholesterol  history of irregular heartbeat  history of stroke  kidney disease  liver disease  stomach or intestine problems  an unusual or allergic reaction to rizatriptan, other medicines, foods, dyes, or preservatives  pregnant or trying to get pregnant  breast-feeding How should I use this medicine? Take this  medicine by mouth. Follow the directions on the prescription label. Leave the tablet in the sealed blister pack until you are ready to take it. With dry hands, open the blister and gently remove the tablet. If the tablet breaks or crumbles, throw it away and take a new tablet out of the blister pack. Place the tablet in the mouth and allow it to dissolve, and then swallow. Do not cut, crush, or chew this medicine. You do not need water to take this medicine. Do not take it more often than directed. Talk to your pediatrician regarding the use of this medicine in children. While this drug may be prescribed for children as young as 6 years for selected conditions, precautions do apply. Overdosage: If you think you have taken too much of this medicine contact a poison control center or emergency room at once. NOTE: This medicine is only for you. Do not share this medicine with others. What if I miss a dose? This does not apply. This medicine is not for regular use. What may interact with this medicine? Do not take this medicine with any of the following medicines:  certain medicines for migraine headache like almotriptan, eletriptan, frovatriptan, naratriptan, rizatriptan, sumatriptan, zolmitriptan  ergot alkaloids like dihydroergotamine, ergonovine, ergotamine, methylergonovine  MAOIs like Carbex, Eldepryl, Marplan, Nardil, and Parnate  This medicine may also interact with the following medications:  certain medicines for depression, anxiety, or psychotic disorders  propranolol This list may not describe all possible interactions. Give your health care provider a list of all the medicines, herbs, non-prescription drugs, or dietary supplements you use. Also tell them if you smoke, drink alcohol, or use illegal drugs. Some items may interact with your medicine. What should I watch for while using this medicine? Visit your healthcare professional for regular checks on your progress. Tell your  healthcare professional if your symptoms do not start to get better or if they get worse. You may get drowsy or dizzy. Do not drive, use machinery, or do anything that needs mental alertness until you know how this medicine affects you. Do not stand up or sit up quickly, especially if you are an older patient. This reduces the risk of dizzy or fainting spells. Alcohol may interfere with the effect of this medicine. Your mouth may get dry. Chewing sugarless gum or sucking hard candy and drinking plenty of water may help. Contact your healthcare professional if the problem does not go away or is severe. If you take migraine medicines for 10 or more days a month, your migraines may get worse. Keep a diary of headache days and medicine use. Contact your healthcare professional if your migraine attacks occur more frequently. What side effects may I notice from receiving this medicine? Side effects that you should report to your doctor or health care professional as soon as possible:  allergic reactions like skin rash, itching or hives, swelling of the face, lips, or tongue  chest pain or chest tightness  signs and symptoms of a dangerous change in heartbeat or heart rhythm like chest pain; dizziness; fast, irregular heartbeat; palpitations; feeling faint or lightheaded; falls; breathing problems  signs and symptoms of a stroke like changes in vision; confusion; trouble speaking or understanding; severe headaches; sudden numbness or weakness of the face, arm or leg; trouble walking; dizziness; loss of balance or coordination  signs and symptoms of serotonin syndrome like irritable; confusion; diarrhea; fast or irregular heartbeat; muscle twitching; stiff muscles; trouble walking; sweating; high fever; seizures; chills; vomiting Side effects that usually do not require medical attention (report to your doctor or health care professional if they continue or are  bothersome):  diarrhea  dizziness  drowsiness  dry mouth  headache  nausea, vomiting  pain, tingling, numbness in the hands or feet  stomach pain This list may not describe all possible side effects. Call your doctor for medical advice about side effects. You may report side effects to FDA at 1-800-FDA-1088. Where should I keep my medicine? Keep out of the reach of children. Store at room temperature between 15 and 30 degrees C (59 and 86 degrees F). Protect from light and moisture. Throw away any unused medicine after the expiration date. NOTE: This sheet is a summary. It may not cover all possible information. If you have questions about this medicine, talk to your doctor, pharmacist, or health care provider.  2020 Elsevier/Gold Standard (2017-07-13 14:58:08) Galcanezumab injection What is this medicine? GALCANEZUMAB (gal ka NEZ ue mab) is used to prevent migraines and treat cluster headaches. This medicine may be used for other purposes; ask your health care provider or pharmacist if you have questions. COMMON BRAND NAME(S): Emgality What should I tell my health care provider before I take this medicine? They need to know if you have any of these conditions:  an unusual or allergic  reaction to galcanezumab, other medicines, foods, dyes, or preservatives  pregnant or trying to get pregnant  breast-feeding How should I use this medicine? This medicine is for injection under the skin. You will be taught how to prepare and give this medicine. Use exactly as directed. Take your medicine at regular intervals. Do not take your medicine more often than directed. It is important that you put your used needles and syringes in a special sharps container. Do not put them in a trash can. If you do not have a sharps container, call your pharmacist or healthcare provider to get one. Talk to your pediatrician regarding the use of this medicine in children. Special care may be  needed. Overdosage: If you think you have taken too much of this medicine contact a poison control center or emergency room at once. NOTE: This medicine is only for you. Do not share this medicine with others. What if I miss a dose? If you miss a dose, take it as soon as you can. If it is almost time for your next dose, take only that dose. Do not take double or extra doses. What may interact with this medicine? Interactions are not expected. This list may not describe all possible interactions. Give your health care provider a list of all the medicines, herbs, non-prescription drugs, or dietary supplements you use. Also tell them if you smoke, drink alcohol, or use illegal drugs. Some items may interact with your medicine. What should I watch for while using this medicine? Tell your doctor or healthcare professional if your symptoms do not start to get better or if they get worse. What side effects may I notice from receiving this medicine? Side effects that you should report to your doctor or health care professional as soon as possible:  allergic reactions like skin rash, itching or hives, swelling of the face, lips, or tongue Side effects that usually do not require medical attention (report these to your doctor or health care professional if they continue or are bothersome):  pain, redness, or irritation at site where injected This list may not describe all possible side effects. Call your doctor for medical advice about side effects. You may report side effects to FDA at 1-800-FDA-1088. Where should I keep my medicine? Keep out of the reach of children. You will be instructed on how to store this medicine. Throw away any unused medicine after the expiration date on the label. NOTE: This sheet is a summary. It may not cover all possible information. If you have questions about this medicine, talk to your doctor, pharmacist, or health care provider.  2020 Elsevier/Gold Standard (2017-06-16  12:03:23)

## 2019-09-27 ENCOUNTER — Telehealth: Payer: Self-pay | Admitting: Neurology

## 2019-09-27 NOTE — Telephone Encounter (Signed)
Mcarthur Rossetti Josem Kaufmann: [phone] (exp. 09/27/19 to 10/27/19) order sent to GI. They will reach out to the patient to schedule.

## 2019-09-27 NOTE — Telephone Encounter (Signed)
Completed in other phone note.

## 2019-09-27 NOTE — Telephone Encounter (Signed)
[place] PA form to [phone]. Awaiting response.

## 2019-10-08 ENCOUNTER — Ambulatory Visit
Admission: RE | Admit: 2019-10-08 | Discharge: 2019-10-08 | Disposition: A | Discharge status: Home / Self Care | Payer: Medicare PPO | Source: Ambulatory Visit | Attending: Neurology | Admitting: Neurology

## 2019-10-08 DIAGNOSIS — H539 Unspecified visual disturbance: Secondary | ICD-10-CM

## 2019-10-08 DIAGNOSIS — R51 Headache with orthostatic component, not elsewhere classified: Secondary | ICD-10-CM

## 2019-10-08 DIAGNOSIS — R519 Headache, unspecified: Secondary | ICD-10-CM | POA: Diagnosis not present

## 2019-10-08 MED ORDER — GADOBENATE DIMEGLUMINE 529 MG/ML IV SOLN
11.0000 mL | Freq: Once | INTRAVENOUS | Status: AC | PRN
  Administered 2019-10-08: 11 mL via INTRAVENOUS

## 2019-10-09 NOTE — Telephone Encounter (Signed)
Received approval from [facility] via fax. PA/EOC #[id number] (09/27/19- 01/12/20). Patient will be B/B.

## 2019-10-11 NOTE — Telephone Encounter (Signed)
I called patient and scheduled her first Botox injection for 10/5.

## 2019-10-17 ENCOUNTER — Ambulatory Visit: Payer: Medicare PPO | Admitting: Neurology

## 2019-10-17 ENCOUNTER — Other Ambulatory Visit: Payer: Self-pay

## 2019-10-17 DIAGNOSIS — G43709 Chronic migraine without aura, not intractable, without status migrainosus: Secondary | ICD-10-CM

## 2019-10-17 NOTE — Progress Notes (Signed)
Botox consent signed Botox- 200 units x 1 vial Lot: [id number] Expiration: 06/2022 NDC: 6789-3810-17  Bacteriostatic 0.9% Sodium Chloride- 12mL total Lot: [id number] Expiration: 10/12/2020 NDC: 5277-8242-35  Dx: T61.443 B/B

## 2019-10-17 NOTE — Progress Notes (Signed)
Consent Form Botulism Toxin Injection For Chronic Migraine  10/17/2019: First botox 10 each orb occuli. 7units left masseter, 5units right masseter.   Reviewed orally with patient, additionally signature is on file:  Botulism toxin has been approved by the Federal drug administration for treatment of chronic migraine. Botulism toxin does not cure chronic migraine and it may not be effective in some patients.  The administration of botulism toxin is accomplished by injecting a small amount of toxin into the muscles of the neck and head. Dosage must be titrated for each individual. Any benefits resulting from botulism toxin tend to wear off after 3 months with a repeat injection required if benefit is to be maintained. Injections are usually done every 3-4 months with maximum effect peak achieved by about 2 or 3 weeks. Botulism toxin is expensive and you should be sure of what costs you will incur resulting from the injection.  The side effects of botulism toxin use for chronic migraine may include:   -Transient, and usually mild, facial weakness with facial injections  -Transient, and usually mild, head or neck weakness with head/neck injections  -Reduction or loss of forehead facial animation due to forehead muscle weakness  -Eyelid drooping  -Dry eye  -Pain at the site of injection or bruising at the site of injection  -Double vision  -Potential unknown long term risks  Contraindications: You should not have Botox if you are pregnant, nursing, allergic to albumin, have an infection, skin condition, or muscle weakness at the site of the injection, or have myasthenia gravis, Lambert-Eaton syndrome, or ALS.  It is also possible that as with any injection, there may be an allergic reaction or no effect from the medication. Reduced effectiveness after repeated injections is sometimes seen and rarely infection at the injection site may occur. All care will be taken to prevent these side effects. If  therapy is given over a long time, atrophy and wasting in the muscle injected may occur. Occasionally the patient's become refractory to treatment because they develop antibodies to the toxin. In this event, therapy needs to be modified.  I have read the above information and consent to the administration of botulism toxin.    BOTOX PROCEDURE NOTE FOR MIGRAINE HEADACHE    Contraindications and precautions discussed with patient(above). Aseptic procedure was observed and patient tolerated procedure. Procedure performed by Dr. Georgia Dom  The condition has existed for more than 6 months, and pt does not have a diagnosis of ALS, Myasthenia Gravis or Lambert-Eaton Syndrome.  Risks and benefits of injections discussed and pt agrees to proceed with the procedure.  Written consent obtained  These injections are medically necessary. Pt  receives good benefits from these injections. These injections do not cause sedations or hallucinations which the oral therapies may cause.  Description of procedure:  The patient was placed in a sitting position. The standard protocol was used for Botox as follows, with 5 units of Botox injected at each site:   -Procerus muscle, midline injection  -Corrugator muscle, bilateral injection  -Frontalis muscle, bilateral injection, with 2 sites each side, medial injection was performed in the upper one third of the frontalis muscle, in the region vertical from the medial inferior edge of the superior orbital rim. The lateral injection was again in the upper one third of the forehead vertically above the lateral limbus of the cornea, 1.5 cm lateral to the medial injection site.  -Temporalis muscle injection, 4 sites, bilaterally. The first injection was 3 cm above  the tragus of the ear, second injection site was 1.5 cm to 3 cm up from the first injection site in line with the tragus of the ear. The third injection site was 1.5-3 cm forward between the first 2 injection  sites. The fourth injection site was 1.5 cm posterior to the second injection site.   -Occipitalis muscle injection, 3 sites, bilaterally. The first injection was done one half way between the occipital protuberance and the tip of the mastoid process behind the ear. The second injection site was done lateral and superior to the first, 1 fingerbreadth from the first injection. The third injection site was 1 fingerbreadth superiorly and medially from the first injection site.  -Cervical paraspinal muscle injection, 2 sites, bilateral knee first injection site was 1 cm from the midline of the cervical spine, 3 cm inferior to the lower border of the occipital protuberance. The second injection site was 1.5 cm superiorly and laterally to the first injection site.  -Trapezius muscle injection was performed at 3 sites, bilaterally. The first injection site was in the upper trapezius muscle halfway between the inflection point of the neck, and the acromion. The second injection site was one half way between the acromion and the first injection site. The third injection was done between the first injection site and the inflection point of the neck.   Will return for repeat injection in 3 months.   200 units of Botox was used, any Botox not injected was wasted. The patient tolerated the procedure well, there were no complications of the above procedure.

## 2019-11-27 ENCOUNTER — Telehealth: Payer: Self-pay | Admitting: Neurology

## 2019-11-27 NOTE — Telephone Encounter (Signed)
Patient had a Botox appointment on 10/5. Her next appointment is 01/23/20. Patient has a PA on file with [employer] that expires 12/31. I filled out new PA form and will give to MD to sign.

## 2019-11-29 ENCOUNTER — Other Ambulatory Visit: Payer: Self-pay | Admitting: *Deleted

## 2019-11-29 DIAGNOSIS — G43709 Chronic migraine without aura, not intractable, without status migrainosus: Secondary | ICD-10-CM

## 2019-11-29 MED ORDER — EMGALITY 120 MG/ML ~~LOC~~ SOAJ: 1.0000 mL | SUBCUTANEOUS | 3 refills | Status: DC

## 2019-12-04 NOTE — Telephone Encounter (Signed)
Faxed signed PA to [facility].

## 2019-12-20 ENCOUNTER — Encounter: Payer: Self-pay | Admitting: *Deleted

## 2019-12-20 ENCOUNTER — Telehealth: Payer: Self-pay | Admitting: *Deleted

## 2019-12-20 NOTE — Telephone Encounter (Signed)
Completed [employer] PA on Cover My Meds. Key: [id number]. Awaiting determination from [facility].

## 2019-12-20 NOTE — Telephone Encounter (Signed)
Approval letter was received from [facility]. I faxed this to the pt's pharmacy. Received a receipt of confirmation.

## 2019-12-20 NOTE — Telephone Encounter (Signed)
PA Case: [id number], Status: Approved, Coverage Starts on: 12/20/2019 12:00:00 AM, Coverage Ends on: 03/19/2020 12:00:00 AM. Questions? Contact [phone].  Pt aware.

## 2019-12-22 DIAGNOSIS — G43709 Chronic migraine without aura, not intractable, without status migrainosus: Secondary | ICD-10-CM

## 2019-12-25 MED ORDER — EMGALITY 120 MG/ML ~~LOC~~ SOAJ: 1.0000 mL | SUBCUTANEOUS | 3 refills | Status: DC

## 2019-12-25 NOTE — Telephone Encounter (Signed)
The Rx was previously sent and confirmed by the pharmacy. [employer] Rx was sent again to [facility] today. I also called the pharmacy and was told the patient had already picked it up over the weekend.

## 2020-01-15 ENCOUNTER — Other Ambulatory Visit: Payer: Self-pay | Admitting: Internal Medicine

## 2020-01-15 NOTE — Telephone Encounter (Signed)
Late entry- Received faxed approval from [facility]. PA/EOC #[id number] (01/13/20- 01/11/21).

## 2020-01-23 ENCOUNTER — Ambulatory Visit: Payer: Medicare PPO | Admitting: Neurology

## 2020-01-23 ENCOUNTER — Telehealth: Payer: Self-pay | Admitting: *Deleted

## 2020-01-23 DIAGNOSIS — G43709 Chronic migraine without aura, not intractable, without status migrainosus: Secondary | ICD-10-CM | POA: Diagnosis not present

## 2020-01-23 MED ORDER — NITROFURANTOIN MONOHYD MACRO 100 MG PO CAPS: 100.0000 mg | ORAL_CAPSULE | Freq: Two times a day (BID) | ORAL | 0 refills | Status: DC

## 2020-01-23 NOTE — Progress Notes (Signed)
Botox- 100 units x 2 vials Lot: [id number] Expiration: 02/2022 NDC: 2229-7989-21  Bacteriostatic 0.9% Sodium Chloride- 46mL total Lot: [id number] Expiration: 02/12/2021 NDC: 0814-4818-56  Dx: D14.970 B/B

## 2020-01-23 NOTE — Telephone Encounter (Signed)
Spoke with Ms. Jacqueline Meza.  She sent a MyChart message yesterday about possible UTI.  I responded to patient stating she would need an appointment before antibiotics could be prescribed.  Dr. Silvio Pate was out of the office on Monday when MyChart message was sent.  Patient called today asking to make an appointment be bring in urine.  Patient is having urgency and burning with urination.  She has started taking OTC AZO x 3 days.  I spoke with Dr. Silvio Pate. He gave me verbal order to send in Macrobid 100 mg to take 1 capsule twice a day x 3 days.  Rx sent to [facility] at instructed.  Ms. Jacqueline Meza notified of this via telephone.  She is scheduled to see Dr. Silvio Pate on Friday 01/26/20 for her Wellness Exam.

## 2020-01-23 NOTE — Progress Notes (Signed)
Consent Form Botulism Toxin Injection For Chronic Migraine  01/23/2020: Second botox. Excellent response!! >> 50% improvement in frequency. She has 34 73 year old twin grandchildren, a boy and a girl, and spent the holidays there. 10 each orb occuli. 7units left masseter, 5units right masseter.  Her left masseter is worst, the injections helped but the left masseter was sore follow up on that next time. Put extra 15 right occipitalis and cervical paraspinals were her migraines ettle.   10/17/2019: First botox 10 each orb occuli. 7units left masseter, 5units right masseter.   Reviewed orally with patient, additionally signature is on file:  Botulism toxin has been approved by the Federal drug administration for treatment of chronic migraine. Botulism toxin does not cure chronic migraine and it may not be effective in some patients.  The administration of botulism toxin is accomplished by injecting a small amount of toxin into the muscles of the neck and head. Dosage must be titrated for each individual. Any benefits resulting from botulism toxin tend to wear off after 3 months with a repeat injection required if benefit is to be maintained. Injections are usually done every 3-4 months with maximum effect peak achieved by about 2 or 3 weeks. Botulism toxin is expensive and you should be sure of what costs you will incur resulting from the injection.  The side effects of botulism toxin use for chronic migraine may include:   -Transient, and usually mild, facial weakness with facial injections  -Transient, and usually mild, head or neck weakness with head/neck injections  -Reduction or loss of forehead facial animation due to forehead muscle weakness  -Eyelid drooping  -Dry eye  -Pain at the site of injection or bruising at the site of injection  -Double vision  -Potential unknown long term risks  Contraindications: You should not have Botox if you are pregnant, nursing, allergic to albumin, have an  infection, skin condition, or muscle weakness at the site of the injection, or have myasthenia gravis, Lambert-Eaton syndrome, or ALS.  It is also possible that as with any injection, there may be an allergic reaction or no effect from the medication. Reduced effectiveness after repeated injections is sometimes seen and rarely infection at the injection site may occur. All care will be taken to prevent these side effects. If therapy is given over a long time, atrophy and wasting in the muscle injected may occur. Occasionally the patient's become refractory to treatment because they develop antibodies to the toxin. In this event, therapy needs to be modified.  I have read the above information and consent to the administration of botulism toxin.    BOTOX PROCEDURE NOTE FOR MIGRAINE HEADACHE    Contraindications and precautions discussed with patient(above). Aseptic procedure was observed and patient tolerated procedure. Procedure performed by Dr. Georgia Dom  The condition has existed for more than 6 months, and pt does not have a diagnosis of ALS, Myasthenia Gravis or Lambert-Eaton Syndrome.  Risks and benefits of injections discussed and pt agrees to proceed with the procedure.  Written consent obtained  These injections are medically necessary. Pt  receives good benefits from these injections. These injections do not cause sedations or hallucinations which the oral therapies may cause.  Description of procedure:  The patient was placed in a sitting position. The standard protocol was used for Botox as follows, with 5 units of Botox injected at each site:   -Procerus muscle, midline injection  -Corrugator muscle, bilateral injection  -Frontalis muscle, bilateral injection, with 2  sites each side, medial injection was performed in the upper one third of the frontalis muscle, in the region vertical from the medial inferior edge of the superior orbital rim. The lateral injection was again in  the upper one third of the forehead vertically above the lateral limbus of the cornea, 1.5 cm lateral to the medial injection site.  -Temporalis muscle injection, 4 sites, bilaterally. The first injection was 3 cm above the tragus of the ear, second injection site was 1.5 cm to 3 cm up from the first injection site in line with the tragus of the ear. The third injection site was 1.5-3 cm forward between the first 2 injection sites. The fourth injection site was 1.5 cm posterior to the second injection site.   -Occipitalis muscle injection, 3 sites, bilaterally. The first injection was done one half way between the occipital protuberance and the tip of the mastoid process behind the ear. The second injection site was done lateral and superior to the first, 1 fingerbreadth from the first injection. The third injection site was 1 fingerbreadth superiorly and medially from the first injection site.  -Cervical paraspinal muscle injection, 2 sites, bilateral knee first injection site was 1 cm from the midline of the cervical spine, 3 cm inferior to the lower border of the occipital protuberance. The second injection site was 1.5 cm superiorly and laterally to the first injection site.  -Trapezius muscle injection was performed at 3 sites, bilaterally. The first injection site was in the upper trapezius muscle halfway between the inflection point of the neck, and the acromion. The second injection site was one half way between the acromion and the first injection site. The third injection was done between the first injection site and the inflection point of the neck.   Will return for repeat injection in 3 months.   200 units of Botox was used, any Botox not injected was wasted. The patient tolerated the procedure well, there were no complications of the above procedure.

## 2020-01-26 ENCOUNTER — Encounter: Payer: Self-pay | Admitting: Internal Medicine

## 2020-01-26 ENCOUNTER — Ambulatory Visit (INDEPENDENT_AMBULATORY_CARE_PROVIDER_SITE_OTHER): Payer: Medicare PPO | Admitting: Internal Medicine

## 2020-01-26 ENCOUNTER — Other Ambulatory Visit: Payer: Self-pay

## 2020-01-26 VITALS — BP 108/68 | HR 74 | Temp 97.2°F | Ht 61.75 in | Wt 126.0 lb

## 2020-01-26 DIAGNOSIS — Z7189 Other specified counseling: Secondary | ICD-10-CM

## 2020-01-26 DIAGNOSIS — G43709 Chronic migraine without aura, not intractable, without status migrainosus: Secondary | ICD-10-CM

## 2020-01-26 DIAGNOSIS — Z1231 Encounter for screening mammogram for malignant neoplasm of breast: Secondary | ICD-10-CM

## 2020-01-26 DIAGNOSIS — Z0000 Encounter for general adult medical examination without abnormal findings: Secondary | ICD-10-CM | POA: Diagnosis not present

## 2020-01-26 LAB — CBC
HCT: 40.6 % (ref 36.0–46.0)
Hemoglobin: 13.8 g/dL (ref 12.0–15.0)
MCHC: 33.9 g/dL (ref 30.0–36.0)
MCV: 108.3 fl — ABNORMAL HIGH (ref 78.0–100.0)
Platelets: 197 10*3/uL (ref 150.0–400.0)
RBC: 3.75 Mil/uL — ABNORMAL LOW (ref 3.87–5.11)
RDW: 13 % (ref 11.5–15.5)
WBC: 6.5 10*3/uL (ref 4.0–10.5)

## 2020-01-26 LAB — COMPREHENSIVE METABOLIC PANEL
ALT: 17 U/L (ref 0–35)
AST: 21 U/L (ref 0–37)
Albumin: 4.3 g/dL (ref 3.5–5.2)
Alkaline Phosphatase: 61 U/L (ref 39–117)
BUN: 22 mg/dL (ref 6–23)
CO2: 24 mEq/L (ref 19–32)
Calcium: 9.3 mg/dL (ref 8.4–10.5)
Chloride: 109 mEq/L (ref 96–112)
Creatinine, Ser: 0.77 mg/dL (ref 0.40–1.20)
GFR: 76.76 mL/min (ref >60.00)
Glucose, Bld: 93 mg/dL (ref 70–99)
Potassium: 4.1 mEq/L (ref 3.5–5.1)
Sodium: 141 mEq/L (ref 135–145)
Total Bilirubin: 0.6 mg/dL (ref 0.2–1.2)
Total Protein: 6.8 g/dL (ref 6.0–8.3)

## 2020-01-26 MED ORDER — SUMATRIPTAN SUCCINATE 100 MG PO TABS: 100.0000 mg | ORAL_TABLET | ORAL | 11 refills | Status: DC | PRN

## 2020-01-26 NOTE — Assessment & Plan Note (Signed)
See social history °Has DNR °

## 2020-01-26 NOTE — Progress Notes (Signed)
Subjective:    Patient ID: Jacqueline Meza, female    DOB: 05-09-1945, 75 y.o.   MRN: [id number]  HPI Here for Medicare wellness visit and follow up of chronic health conditions This visit occurred during the SARS-CoV-2 public health emergency.  Safety protocols were in place, including screening questions prior to the visit, additional usage of staff PPE, and extensive cleaning of exam room while observing appropriate contact time as indicated for disinfecting solutions.   Reviewed form and advanced directives Reviewed other doctors Stopped wine for a while--for calories. 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 Discussed adding aerobic work like walking No falls No depression or anhedonia Independent with instrumental ADLs No memory problems Vision and hearing are fine Headaches are getting better On emgality and second round of botox  Urine symptoms are better Took the 3 days of antibiotic  Had gone to Dr Romelle Starcher to try to manage her weight ?got phentermine No longer going there  Current Outpatient Medications on File Prior to Visit  Medication Sig Dispense Refill  . BIOTIN 5000 PO Take 1 capsule by mouth daily.    . Galcanezumab-gnlm (EMGALITY) 120 MG/ML SOAJ Inject 1 mL into the skin every 30 (thirty) days. 3 mL 3  . Multiple Vitamin (MULTIVITAMIN) tablet Take 1 tablet by mouth daily.    . SUMAtriptan (IMITREX) 100 MG tablet Take 1 tablet by mouth as needed.    . tizanidine (ZANAFLEX) 2 MG capsule Take 1 capsule (2 mg total) by mouth at bedtime as needed for muscle spasms. 30 capsule 1  . topiramate (TOPAMAX) 100 MG tablet TAKE 1 TABLET(100 MG) BY MOUTH TWICE DAILY 180 tablet 3   No current facility-administered medications on file prior to visit.    Allergies  Allergen Reactions  . Codeine Sulfate     REACTION: headache and nausea    Past Medical History:  Diagnosis Date  . Endometriosis    . Hyperlipidemia   . Migraines    since childhood  . Raynaud's phenomenon     Past Surgical History:  Procedure Laterality Date  . ABDOMINAL HYSTERECTOMY  01/12/1993   endometriosis and worsening migraines  . CESAREAN SECTION  1988  . COLONOSCOPY  2005   Dr Bary Castilla  . DILATION AND CURETTAGE OF UTERUS     done after miscarriages twice    Family History  Problem Relation Age of Onset  . Migraines Sister   . Cancer Father        lymphoma  . Bone cancer Father   . Migraines Father   . Kidney failure Mother   . Migraines Daughter   . Cancer Paternal Uncle 53       colon  . Cancer Paternal Aunt        breast  . Migraines Paternal Aunt   . CAD Neg Hx   . Stroke Neg Hx   . Diabetes Neg Hx   . Breast cancer Neg Hx     Social History   Socioeconomic History  . Marital status: Divorced    Spouse name: Not on file  . Number of children: 1  . Years of education: Not on file  . Highest education level: Not on file  Occupational History  . Occupation: Retired--curriculum facilitator    Comment: [employer]  . Occupation: Printmaker part time--math intervention    Comment: retired  . Occupation: Microbiologist houses part time    Comment:  Tobacco Use  . Smoking status: Never Smoker  . Smokeless tobacco: Never Used  Substance and Sexual Activity  . Alcohol use: Yes    Alcohol/week: 7.0 standard drinks    Types: 7 Standard drinks or equivalent per week    Comment: 1 drink a night, wine or vodka sometimes  . Drug use: No  . Sexual activity: Never  Other Topics Concern  . Not on file  Social History Narrative   Has living will   Requests daughter Addy as health care POA.   Requests DNR after discussion --form done 04/22/12   No tube feedings if cognitively unaware         Update 09/26/2019   Lives alone    Right handed   Caffeine: 1 cup/day   Social Determinants of Health   Financial Resource Strain: Not on file  Food Insecurity: Not on file   Transportation Needs: Not on file  Physical Activity: Not on file  Stress: Not on file  Social Connections: Not on file  Intimate Partner Violence: Not on file   Review of Systems Appetite is fine Weight stable Sleeps fair---does wake up at times and some trouble getting back to sleep No daytime somnolence Wears seat belt Teeth are fine--keeps up with dentist No heartburn or dysphagia Bowels move well--no blood No urine incontinence 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 Went to chiropractor for a while--now getting botox in neck/top of shoulders    Objective:   Physical Exam Constitutional:      Appearance: Normal appearance.  HENT:     Mouth/Throat:     Comments: No lesions Eyes:     Conjunctiva/sclera: Conjunctivae normal.     Pupils: Pupils are equal, round, and reactive to light.  Cardiovascular:     Rate and Rhythm: Normal rate and regular rhythm.     Pulses: Normal pulses.     Heart sounds: No murmur heard. No gallop.   Pulmonary:     Effort: Pulmonary effort is normal.     Breath sounds: Normal breath sounds. No wheezing or rales.  Abdominal:     Palpations: Abdomen is soft.     Tenderness: There is no abdominal tenderness.  Musculoskeletal:     Cervical back: Neck supple.     Right lower leg: No edema.     Left lower leg: No edema.  Lymphadenopathy:     Cervical: No cervical adenopathy.  Skin:    General: Skin is warm.     Findings: No rash.  Neurological:     Mental Status: She is alert and oriented to person, place, and time.     Comments: President--- "Zoila Shutter, Obama" [id number] Recall 3/3  Psychiatric:        Mood and Affect: Mood normal.        Behavior: Behavior normal.            Assessment & Plan:

## 2020-01-26 NOTE — Assessment & Plan Note (Signed)
Better with emgality and botox injections Still uses the imitrex prn

## 2020-01-26 NOTE — Assessment & Plan Note (Signed)
I have personally reviewed the Medicare Annual Wellness questionnaire and have noted 1. The patient's medical and social history 2. Their use of alcohol, tobacco or illicit drugs 3. Their current medications and supplements 4. The patient's functional ability including ADL's, fall risks, home safety risks and hearing or visual             impairment. 5. Diet and physical activities 6. Evidence for depression or mood disorders  The patients weight, height, BMI and visual acuity have been recorded in the chart I have made referrals, counseling and provided education to the patient based review of the above and I have provided the pt with a written personalized care plan for preventive services.  I have provided you with a copy of your personalized plan for preventive services. Please take the time to review along with your updated medication list.  Healthy Will set up mammogram Plan last colonoscopy 2025 No pap due to age 75 active Had COVID booster and flu vaccine Consider shingrix at the pharmacy

## 2020-02-29 ENCOUNTER — Other Ambulatory Visit: Payer: Self-pay

## 2020-02-29 ENCOUNTER — Ambulatory Visit
Admission: RE | Admit: 2020-02-29 | Discharge: 2020-02-29 | Disposition: A | Discharge status: Home / Self Care | Payer: Medicare PPO | Source: Ambulatory Visit | Attending: Internal Medicine | Admitting: Internal Medicine

## 2020-02-29 DIAGNOSIS — Z1231 Encounter for screening mammogram for malignant neoplasm of breast: Secondary | ICD-10-CM | POA: Insufficient documentation

## 2020-03-20 ENCOUNTER — Ambulatory Visit: Payer: Medicare PPO | Admitting: Dermatology

## 2020-03-20 ENCOUNTER — Other Ambulatory Visit: Payer: Self-pay

## 2020-03-20 ENCOUNTER — Encounter: Payer: Self-pay | Admitting: Dermatology

## 2020-03-20 DIAGNOSIS — D229 Melanocytic nevi, unspecified: Secondary | ICD-10-CM

## 2020-03-20 DIAGNOSIS — L814 Other melanin hyperpigmentation: Secondary | ICD-10-CM

## 2020-03-20 DIAGNOSIS — L820 Inflamed seborrheic keratosis: Secondary | ICD-10-CM

## 2020-03-20 DIAGNOSIS — Z86018 Personal history of other benign neoplasm: Secondary | ICD-10-CM | POA: Diagnosis not present

## 2020-03-20 DIAGNOSIS — D692 Other nonthrombocytopenic purpura: Secondary | ICD-10-CM

## 2020-03-20 DIAGNOSIS — D1800 Hemangioma unspecified site: Secondary | ICD-10-CM

## 2020-03-20 DIAGNOSIS — L821 Other seborrheic keratosis: Secondary | ICD-10-CM | POA: Diagnosis not present

## 2020-03-20 DIAGNOSIS — Z85828 Personal history of other malignant neoplasm of skin: Secondary | ICD-10-CM

## 2020-03-20 DIAGNOSIS — Z1283 Encounter for screening for malignant neoplasm of skin: Secondary | ICD-10-CM

## 2020-03-20 DIAGNOSIS — L578 Other skin changes due to chronic exposure to nonionizing radiation: Secondary | ICD-10-CM | POA: Diagnosis not present

## 2020-03-20 NOTE — Progress Notes (Signed)
Follow-Up Visit   Subjective  Jacqueline Meza is a 75 y.o. female who presents for the following: Annual Exam. Hx BCC's and dysplastic nevi. The patient presents for Total-Body Skin Exam (TBSE) for skin cancer screening and mole check. She complains of a spot on her left cheeks is irritating and she would like treated.  The following portions of the chart were reviewed this encounter and updated as appropriate:   Tobacco  Allergies  Meds  Problems  Med Hx  Surg Hx  Fam Hx     Review of Systems:  No other skin or systemic complaints except as noted in HPI or Assessment and Plan.  Objective  Well appearing patient in no apparent distress; mood and affect are within normal limits.  A full examination was performed including scalp, head, eyes, ears, nose, lips, neck, chest, axillae, abdomen, back, buttocks, bilateral upper extremities, bilateral lower extremities, hands, feet, fingers, toes, fingernails, and toenails. All findings within normal limits unless otherwise noted below.  Objective  L cheek x 1: Erythematous keratotic or waxy stuck-on papule or plaque.   Assessment & Plan  Inflamed seborrheic keratosis L cheek x 1  Destruction of lesion - L cheek x 1 Complexity: simple   Destruction method: cryotherapy   Informed consent: discussed and consent obtained   Timeout:  patient name, date of birth, surgical site, and procedure verified Lesion destroyed using liquid nitrogen: Yes   Region frozen until ice ball extended beyond lesion: Yes   Outcome: patient tolerated procedure well with no complications   Post-procedure details: wound care instructions given     Lentigines - Scattered tan macules - Due to sun exposure - Benign-appering, observe - Recommend daily broad spectrum sunscreen SPF 30+ to sun-exposed areas, reapply every 2 hours as needed. - Call for any changes  Seborrheic Keratoses - Stuck-on, waxy, tan-brown papules and plaques  - Discussed benign  etiology and prognosis. - Observe - Call for any changes  Melanocytic Nevi - Tan-brown and/or pink-flesh-colored symmetric macules and papules - Benign appearing on exam today - Observation - Call clinic for new or changing moles - Recommend daily use of broad spectrum spf 30+ sunscreen to sun-exposed areas.   Hemangiomas - Red papules - Discussed benign nature - Observe - Call for any changes  Actinic Damage - Chronic, secondary to cumulative UV/sun exposure - diffuse scaly erythematous macules with underlying dyspigmentation - Recommend daily broad spectrum sunscreen SPF 30+ to sun-exposed areas, reapply every 2 hours as needed.  - Call for new or changing lesions.  History of Basal Cell Carcinoma of the Skin - No evidence of recurrence today - Recommend regular full body skin exams - Recommend daily broad spectrum sunscreen SPF 30+ to sun-exposed areas, reapply every 2 hours as needed.  - Call if any new or changing lesions are noted between office visits  History of Dysplastic Nevi - No evidence of recurrence today - Recommend regular full body skin exams - Recommend daily broad spectrum sunscreen SPF 30+ to sun-exposed areas, reapply every 2 hours as needed.  - Call if any new or changing lesions are noted between office visits  Purpura - Chronic; persistent and recurrent.  Treatable, but not curable. - Violaceous macules and patches - Benign - Related to trauma, age, sun damage and/or use of blood thinners, chronic use of topical and/or oral steroids - Observe - Can use OTC arnica containing moisturizer such as Dermend Bruise Formula if desired - Call for worsening or other concerns  Skin cancer screening performed today.  Return in about 1 year (around 03/20/2021) for TBSE - Hx BCC, dysplastic nevi .  Luther Redo, CMA, am acting as scribe for Sarina Ser, MD .  Documentation: I have reviewed the above documentation for accuracy and completeness, and I  agree with the above.  Sarina Ser, MD

## 2020-04-10 ENCOUNTER — Telehealth: Payer: Self-pay

## 2020-04-10 NOTE — Telephone Encounter (Signed)
A sore throat is almost always viral (especially in adults) and antibiotics are not appropriate. If she has ongoing symptoms suggestive of a sinus infection next week (instead of getting better), then we should set up a virtual visit to decide if an antibiotic is appropriate

## 2020-04-10 NOTE — Telephone Encounter (Signed)
Pt said has had sorethroat for 1 day; pt said the S/T is very painful but does not have swelling in throat. Pt said had low grade fever but none now; pt taking ibuprofen. Pt said only other symptom is sinus congestion.Pt said Dr Silvio Pate had given pt abx before without coming in to office and pt does not feel like getting dressed to come out for visit. No available appts at [facility] but offered appt at another [facility] office and pt declined. UC precautions given and pt voiced understanding but wanted note sent to Dr Silvio Pate to see if he would send in abx [facility] [address] [facility] [employer]. Pt request cb after reviewed by Dr Silvio Pate. Pt last seen for med wellness 01/26/20.

## 2020-04-10 NOTE — Telephone Encounter (Signed)
Spoke to pt. She will keep [place] updated if she is not improving.

## 2020-04-11 DIAGNOSIS — Z20822 Contact with and (suspected) exposure to covid-19: Secondary | ICD-10-CM | POA: Diagnosis not present

## 2020-04-11 DIAGNOSIS — J069 Acute upper respiratory infection, unspecified: Secondary | ICD-10-CM | POA: Diagnosis not present

## 2020-04-11 DIAGNOSIS — J0100 Acute maxillary sinusitis, unspecified: Secondary | ICD-10-CM | POA: Diagnosis not present

## 2020-04-23 ENCOUNTER — Encounter: Payer: Self-pay | Admitting: Neurology

## 2020-04-23 ENCOUNTER — Other Ambulatory Visit: Payer: Self-pay

## 2020-04-23 ENCOUNTER — Ambulatory Visit: Payer: Medicare PPO | Admitting: Neurology

## 2020-04-23 DIAGNOSIS — G43709 Chronic migraine without aura, not intractable, without status migrainosus: Secondary | ICD-10-CM

## 2020-04-23 MED ORDER — EMGALITY 120 MG/ML ~~LOC~~ SOAJ
1.0000 mL | SUBCUTANEOUS | 11 refills | Status: DC
Start: 2020-04-23 — End: 2021-01-28

## 2020-04-23 NOTE — Progress Notes (Signed)
Botox- 200 units x 1 vial Lot: [id number] Expiration: 11/2022 NDC: 9675-9163-84  Bacteriostatic 0.9% Sodium Chloride- 75mL total Lot: [id number] Expiration: 02/12/2021 NDC: 5701-7793-90  Dx: Z00.923 B/B

## 2020-04-23 NOTE — Progress Notes (Signed)
Consent Form Botulism Toxin Injection For Chronic Migraine  04/23/2020: third botos. Still excellent response >> 75% improvement freq and severity. She has two 73 year old twin grandchildren, a boy and a girl, and spent the holidays there. 10 each orb occuli. 7units left masseter, 5units right masseter.  Her left masseter is worst, the injections helped but the left masseter was sore follow up on that next time. Put extra 15 left occipitalis and cervical paraspinals were her migraines ettle.   01/23/2020: Second botox. Excellent response!! >> 50% improvement in frequency. She has 47 34 year old twin grandchildren, a boy and a girl, and spent the holidays there. 10 each orb occuli. 7units left masseter, 5units right masseter.  Her left masseter is worst, the injections helped but the left masseter was sore follow up on that next time. Put extra 15 left occipitalis and cervical paraspinals were her migraines ettle.   10/17/2019: First botox 10 each orb occuli. 7units left masseter, 5units right masseter.   Reviewed orally with patient, additionally signature is on file:  Botulism toxin has been approved by the Federal drug administration for treatment of chronic migraine. Botulism toxin does not cure chronic migraine and it may not be effective in some patients.  The administration of botulism toxin is accomplished by injecting a small amount of toxin into the muscles of the neck and head. Dosage must be titrated for each individual. Any benefits resulting from botulism toxin tend to wear off after 3 months with a repeat injection required if benefit is to be maintained. Injections are usually done every 3-4 months with maximum effect peak achieved by about 2 or 3 weeks. Botulism toxin is expensive and you should be sure of what costs you will incur resulting from the injection.  The side effects of botulism toxin use for chronic migraine may include:   -Transient, and usually mild, facial weakness with  facial injections  -Transient, and usually mild, head or neck weakness with head/neck injections  -Reduction or loss of forehead facial animation due to forehead muscle weakness  -Eyelid drooping  -Dry eye  -Pain at the site of injection or bruising at the site of injection  -Double vision  -Potential unknown long term risks  Contraindications: You should not have Botox if you are pregnant, nursing, allergic to albumin, have an infection, skin condition, or muscle weakness at the site of the injection, or have myasthenia gravis, Lambert-Eaton syndrome, or ALS.  It is also possible that as with any injection, there may be an allergic reaction or no effect from the medication. Reduced effectiveness after repeated injections is sometimes seen and rarely infection at the injection site may occur. All care will be taken to prevent these side effects. If therapy is given over a long time, atrophy and wasting in the muscle injected may occur. Occasionally the patient's become refractory to treatment because they develop antibodies to the toxin. In this event, therapy needs to be modified.  I have read the above information and consent to the administration of botulism toxin.    BOTOX PROCEDURE NOTE FOR MIGRAINE HEADACHE    Contraindications and precautions discussed with patient(above). Aseptic procedure was observed and patient tolerated procedure. Procedure performed by Dr. Georgia Dom  The condition has existed for more than 6 months, and pt does not have a diagnosis of ALS, Myasthenia Gravis or Lambert-Eaton Syndrome.  Risks and benefits of injections discussed and pt agrees to proceed with the procedure.  Written consent obtained  These  injections are medically necessary. Pt  receives good benefits from these injections. These injections do not cause sedations or hallucinations which the oral therapies may cause.  Description of procedure:  The patient was placed in a sitting position. The  standard protocol was used for Botox as follows, with 5 units of Botox injected at each site:   -Procerus muscle, midline injection  -Corrugator muscle, bilateral injection  -Frontalis muscle, bilateral injection, with 2 sites each side, medial injection was performed in the upper one third of the frontalis muscle, in the region vertical from the medial inferior edge of the superior orbital rim. The lateral injection was again in the upper one third of the forehead vertically above the lateral limbus of the cornea, 1.5 cm lateral to the medial injection site.  -Temporalis muscle injection, 4 sites, bilaterally. The first injection was 3 cm above the tragus of the ear, second injection site was 1.5 cm to 3 cm up from the first injection site in line with the tragus of the ear. The third injection site was 1.5-3 cm forward between the first 2 injection sites. The fourth injection site was 1.5 cm posterior to the second injection site.   -Occipitalis muscle injection, 3 sites, bilaterally. The first injection was done one half way between the occipital protuberance and the tip of the mastoid process behind the ear. The second injection site was done lateral and superior to the first, 1 fingerbreadth from the first injection. The third injection site was 1 fingerbreadth superiorly and medially from the first injection site.  -Cervical paraspinal muscle injection, 2 sites, bilateral knee first injection site was 1 cm from the midline of the cervical spine, 3 cm inferior to the lower border of the occipital protuberance. The second injection site was 1.5 cm superiorly and laterally to the first injection site.  -Trapezius muscle injection was performed at 3 sites, bilaterally. The first injection site was in the upper trapezius muscle halfway between the inflection point of the neck, and the acromion. The second injection site was one half way between the acromion and the first injection site. The third  injection was done between the first injection site and the inflection point of the neck.   Will return for repeat injection in 3 months.   200 units of Botox was used, any Botox not injected was wasted. The patient tolerated the procedure well, there were no complications of the above procedure.

## 2020-07-08 ENCOUNTER — Other Ambulatory Visit: Payer: Self-pay

## 2020-07-08 ENCOUNTER — Ambulatory Visit: Payer: Medicare PPO | Admitting: Internal Medicine

## 2020-07-08 ENCOUNTER — Encounter: Payer: Self-pay | Admitting: Internal Medicine

## 2020-07-08 DIAGNOSIS — R0981 Nasal congestion: Secondary | ICD-10-CM | POA: Diagnosis not present

## 2020-07-08 NOTE — Assessment & Plan Note (Signed)
Unclear if allergic since never had issues before Clear nasal congestion--probably leading to left Eustachian tube symptoms No clear infection now   Will have her resume the fluticasone nasal spray and try cetirizine at bedtime If symptoms persist, ENT evaluation

## 2020-07-08 NOTE — Patient Instructions (Signed)
Please restart the fluticasone nasal spray --2 sprays in each nostril daily. Add cetirizine (zyrtec) 10mg  at bedtime as well You can take the ibuprofen as needed If your symptoms don't improve, the next step is an ENT evaluation

## 2020-07-08 NOTE — Progress Notes (Signed)
Subjective:    Patient ID: Jacqueline Meza, female    DOB: 20-Nov-1945, 75 y.o.   MRN: [id number]  HPI Here due to recurrent sinus symptoms This visit occurred during the SARS-CoV-2 public health emergency.  Safety protocols were in place, including screening questions prior to the visit, additional usage of staff PPE, and extensive cleaning of exam room while observing appropriate contact time as indicated for disinfecting solutions.   Started with sinus infection and serous otitis media in March Seen at [facility] cliniic Augmentin and nasal spray----sinus symptoms resolved but ear symptoms have persisted Awakens with morning hoarseness Voice feels raw Persistent left ear symptoms---pain when swallowing at times and feels like there is water in there  No fever Slight "croupy" type cough No SOB and no wheezing  Using ibuprofen and benedryl (usually at night)---it does help some  Current Outpatient Medications on File Prior to Visit  Medication Sig Dispense Refill   BIOTIN 5000 PO Take 1 capsule by mouth daily.     Galcanezumab-gnlm (EMGALITY) 120 MG/ML SOAJ Inject 1 mL into the skin every 30 (thirty) days. 1 mL 11   Multiple Vitamin (MULTIVITAMIN) tablet Take 1 tablet by mouth daily.     SUMAtriptan (IMITREX) 100 MG tablet Take 1 tablet (100 mg total) by mouth as needed. 10 tablet 11   topiramate (TOPAMAX) 100 MG tablet TAKE 1 TABLET(100 MG) BY MOUTH TWICE DAILY 180 tablet 3   tizanidine (ZANAFLEX) 2 MG capsule Take 1 capsule (2 mg total) by mouth at bedtime as needed for muscle spasms. (Patient not taking: Reported on 07/08/2020) 30 capsule 1   No current facility-administered medications on file prior to visit.    Allergies  Allergen Reactions   Codeine Sulfate     REACTION: headache and nausea    Past Medical History:  Diagnosis Date   Actinic keratosis 12/12/2007   R leg - bx proven    Basal cell carcinoma 01/04/2007   L upper back - superficial    Basal cell  carcinoma 11/08/2013   L ant scalp    Basal cell carcinoma 12/10/2014   L nasal tip    Dysplastic nevus 01/23/2008   R ant ankle - moderate   Dysplastic nevus 11/08/2012   R prox dorsum of foot - mild    Endometriosis    Hyperlipidemia    Migraines    since childhood   Raynaud's phenomenon     Past Surgical History:  Procedure Laterality Date   ABDOMINAL HYSTERECTOMY  01/12/1993   endometriosis and worsening migraines   CESAREAN SECTION  1988   COLONOSCOPY  2005   Dr Bary Castilla   DILATION AND CURETTAGE OF UTERUS     done after miscarriages twice    Family History  Problem Relation Age of Onset   Migraines Sister    Cancer Father        lymphoma   Bone cancer Father    Migraines Father    Kidney failure Mother    Migraines Daughter    Cancer Paternal Uncle 89       colon   Cancer Paternal Aunt        breast   Breast cancer Paternal Aunt        late 70's   Migraines Paternal Aunt    CAD Neg Hx    Stroke Neg Hx    Diabetes Neg Hx     Social History   Socioeconomic History   Marital status: Divorced    Spouse  name: Not on file   Number of children: 1   Years of education: Not on file   Highest education level: Not on file  Occupational History   Occupation: Retired--curriculum facilitator    Comment: [employer]   Occupation: Teaching part time--math intervention    Comment: retired   Occupation: Microbiologist houses part time    Comment:    Tobacco Use   Smoking status: Never   Smokeless tobacco: Never  Substance and Sexual Activity   Alcohol use: Yes    Alcohol/week: 7.0 standard drinks    Types: 7 Standard drinks or equivalent per week    Comment: 1 drink a night, wine or vodka sometimes   Drug use: No   Sexual activity: Never  Other Topics Concern   Not on file  Social History Narrative   Has living will   Requests daughter Addy as health care POA.   Requests DNR after discussion --form done 04/22/12   No tube feedings if cognitively  unaware         Update 09/26/2019   Lives alone    Right handed   Caffeine: 1 cup/day   Social Determinants of Health   Financial Resource Strain: Not on file  Food Insecurity: Not on file  Transportation Needs: Not on file  Physical Activity: Not on file  Stress: Not on file  Social Connections: Not on file  Intimate Partner Violence: Not on file   Review of Systems Doesn't feel sick now No N/V No heartburn or dysphagia No pets. No recent move, etc     Objective:   Physical Exam Constitutional:      Appearance: Normal appearance.  HENT:     Head:     Comments: Slight left maxillary sensitivity (but not really tender)    Right Ear: Tympanic membrane and ear canal normal.     Left Ear: Tympanic membrane and ear canal normal.     Ears:     Comments: No inflammation ?slight retraction of TMs    Nose:     Comments: Moderate inflammation bilaterally    Mouth/Throat:     Pharynx: No oropharyngeal exudate or posterior oropharyngeal erythema.  Pulmonary:     Effort: Pulmonary effort is normal.     Breath sounds: Normal breath sounds. No wheezing or rales.  Musculoskeletal:     Cervical back: Neck supple.  Lymphadenopathy:     Cervical: No cervical adenopathy.  Neurological:     Mental Status: She is alert.           Assessment & Plan:

## 2020-07-23 ENCOUNTER — Ambulatory Visit: Payer: Medicare PPO | Admitting: Neurology

## 2020-07-23 ENCOUNTER — Encounter: Payer: Self-pay | Admitting: Neurology

## 2020-07-23 DIAGNOSIS — G43709 Chronic migraine without aura, not intractable, without status migrainosus: Secondary | ICD-10-CM

## 2020-07-23 NOTE — Progress Notes (Signed)
Botox- 200 units x 1 vial Lot: [id number] Expiration: 01/2023 NDC: 5638-7564-33  Bacteriostatic 0.9% Sodium Chloride- 63mL total [id number] Expiration: 02/2021 NDC: 06301-601-09  Dx: N23.557 BB

## 2020-07-23 NOTE — Progress Notes (Signed)
Consent Form Botulism Toxin Injection For Chronic Migraine  07/23/2020: stable, 4th  botos. Still excellent response >> 75% improvement freq and severity. She has two 73 year old twin grandchildren, a boy and a girl, and spent the holidays 2021 there, went to the beach with friends first week of august. 10 each orb occuli. 7units left masseter, 5units right masseter.  Her left masseter is worst. Put extra 15 left occipitalis and cervical paraspinals were her migraines worse.    04/23/2020: third botos. Still excellent response >> 75% improvement freq and severity. She has two 51 year old twin grandchildren, a boy and a girl, and spent the holidays there. 10 each orb occuli. 7units left masseter, 5units right masseter.  Her left masseter is worst, the injections helped but the left masseter was sore follow up on that next time. Put extra 15 left occipitalis and cervical paraspinals were her migraines ettle.   01/23/2020: Second botox. Excellent response!! >> 50% improvement in frequency. She has 27 40 year old twin grandchildren, a boy and a girl, and spent the holidays there. 10 each orb occuli. 7units left masseter, 5units right masseter.  Her left masseter is worst, the injections helped but the left masseter was sore follow up on that next time. Put extra 15 left occipitalis and cervical paraspinals were her migraines ettle.   10/17/2019: First botox 10 each orb occuli. 7units left masseter, 5units right masseter.   Reviewed orally with patient, additionally signature is on file:  Botulism toxin has been approved by the Federal drug administration for treatment of chronic migraine. Botulism toxin does not cure chronic migraine and it may not be effective in some patients.  The administration of botulism toxin is accomplished by injecting a small amount of toxin into the muscles of the neck and head. Dosage must be titrated for each individual. Any benefits resulting from botulism toxin tend to wear off  after 3 months with a repeat injection required if benefit is to be maintained. Injections are usually done every 3-4 months with maximum effect peak achieved by about 2 or 3 weeks. Botulism toxin is expensive and you should be sure of what costs you will incur resulting from the injection.  The side effects of botulism toxin use for chronic migraine may include:   -Transient, and usually mild, facial weakness with facial injections  -Transient, and usually mild, head or neck weakness with head/neck injections  -Reduction or loss of forehead facial animation due to forehead muscle weakness  -Eyelid drooping  -Dry eye  -Pain at the site of injection or bruising at the site of injection  -Double vision  -Potential unknown long term risks  Contraindications: You should not have Botox if you are pregnant, nursing, allergic to albumin, have an infection, skin condition, or muscle weakness at the site of the injection, or have myasthenia gravis, Lambert-Eaton syndrome, or ALS.  It is also possible that as with any injection, there may be an allergic reaction or no effect from the medication. Reduced effectiveness after repeated injections is sometimes seen and rarely infection at the injection site may occur. All care will be taken to prevent these side effects. If therapy is given over a long time, atrophy and wasting in the muscle injected may occur. Occasionally the patient's become refractory to treatment because they develop antibodies to the toxin. In this event, therapy needs to be modified.  I have read the above information and consent to the administration of botulism toxin.  BOTOX PROCEDURE NOTE FOR MIGRAINE HEADACHE    Contraindications and precautions discussed with patient(above). Aseptic procedure was observed and patient tolerated procedure. Procedure performed by Dr. Georgia Dom  The condition has existed for more than 6 months, and pt does not have a diagnosis of ALS,  Myasthenia Gravis or Lambert-Eaton Syndrome.  Risks and benefits of injections discussed and pt agrees to proceed with the procedure.  Written consent obtained  These injections are medically necessary. Pt  receives good benefits from these injections. These injections do not cause sedations or hallucinations which the oral therapies may cause.  Description of procedure:  The patient was placed in a sitting position. The standard protocol was used for Botox as follows, with 5 units of Botox injected at each site:   -Procerus muscle, midline injection  -Corrugator muscle, bilateral injection  -Frontalis muscle, bilateral injection, with 2 sites each side, medial injection was performed in the upper one third of the frontalis muscle, in the region vertical from the medial inferior edge of the superior orbital rim. The lateral injection was again in the upper one third of the forehead vertically above the lateral limbus of the cornea, 1.5 cm lateral to the medial injection site.  -Temporalis muscle injection, 4 sites, bilaterally. The first injection was 3 cm above the tragus of the ear, second injection site was 1.5 cm to 3 cm up from the first injection site in line with the tragus of the ear. The third injection site was 1.5-3 cm forward between the first 2 injection sites. The fourth injection site was 1.5 cm posterior to the second injection site.   -Occipitalis muscle injection, 3 sites, bilaterally. The first injection was done one half way between the occipital protuberance and the tip of the mastoid process behind the ear. The second injection site was done lateral and superior to the first, 1 fingerbreadth from the first injection. The third injection site was 1 fingerbreadth superiorly and medially from the first injection site.  -Cervical paraspinal muscle injection, 2 sites, bilateral knee first injection site was 1 cm from the midline of the cervical spine, 3 cm inferior to the lower  border of the occipital protuberance. The second injection site was 1.5 cm superiorly and laterally to the first injection site.  -Trapezius muscle injection was performed at 3 sites, bilaterally. The first injection site was in the upper trapezius muscle halfway between the inflection point of the neck, and the acromion. The second injection site was one half way between the acromion and the first injection site. The third injection was done between the first injection site and the inflection point of the neck.   Will return for repeat injection in 3 months.   200 units of Botox was used, any Botox not injected was wasted. The patient tolerated the procedure well, there were no complications of the above procedure.

## 2020-08-27 MED ORDER — NITROFURANTOIN MONOHYD MACRO 100 MG PO CAPS
100.0000 mg | ORAL_CAPSULE | Freq: Two times a day (BID) | ORAL | 1 refills | Status: DC
Start: 2020-08-27 — End: 2021-01-27

## 2020-10-06 DIAGNOSIS — Z809 Family history of malignant neoplasm, unspecified: Secondary | ICD-10-CM | POA: Diagnosis not present

## 2020-10-06 DIAGNOSIS — Z8249 Family history of ischemic heart disease and other diseases of the circulatory system: Secondary | ICD-10-CM | POA: Diagnosis not present

## 2020-10-06 DIAGNOSIS — G43909 Migraine, unspecified, not intractable, without status migrainosus: Secondary | ICD-10-CM | POA: Diagnosis not present

## 2020-10-29 ENCOUNTER — Other Ambulatory Visit: Payer: Self-pay

## 2020-10-29 ENCOUNTER — Ambulatory Visit: Payer: Medicare PPO | Admitting: Neurology

## 2020-10-29 DIAGNOSIS — G43709 Chronic migraine without aura, not intractable, without status migrainosus: Secondary | ICD-10-CM | POA: Diagnosis not present

## 2020-10-29 NOTE — Progress Notes (Signed)
Consent Form Botulism Toxin Injection For Chronic Migraine  10/29/2020:  Still excellent response >> 75% improvement freq and severity. She has two 73 year old twin grandchildren, a boy and a girl, kindergarten.   Reviewed orally with patient, additionally signature is on file:  Botulism toxin has been approved by the Federal drug administration for treatment of chronic migraine. Botulism toxin does not cure chronic migraine and it may not be effective in some patients.  The administration of botulism toxin is accomplished by injecting a small amount of toxin into the muscles of the neck and head. Dosage must be titrated for each individual. Any benefits resulting from botulism toxin tend to wear off after 3 months with a repeat injection required if benefit is to be maintained. Injections are usually done every 3-4 months with maximum effect peak achieved by about 2 or 3 weeks. Botulism toxin is expensive and you should be sure of what costs you will incur resulting from the injection.  The side effects of botulism toxin use for chronic migraine may include:   -Transient, and usually mild, facial weakness with facial injections  -Transient, and usually mild, head or neck weakness with head/neck injections  -Reduction or loss of forehead facial animation due to forehead muscle weakness  -Eyelid drooping  -Dry eye  -Pain at the site of injection or bruising at the site of injection  -Double vision  -Potential unknown long term risks  Contraindications: You should not have Botox if you are pregnant, nursing, allergic to albumin, have an infection, skin condition, or muscle weakness at the site of the injection, or have myasthenia gravis, Lambert-Eaton syndrome, or ALS.  It is also possible that as with any injection, there may be an allergic reaction or no effect from the medication. Reduced effectiveness after repeated injections is sometimes seen and rarely infection at the injection site may  occur. All care will be taken to prevent these side effects. If therapy is given over a long time, atrophy and wasting in the muscle injected may occur. Occasionally the patient's become refractory to treatment because they develop antibodies to the toxin. In this event, therapy needs to be modified.  I have read the above information and consent to the administration of botulism toxin.    BOTOX PROCEDURE NOTE FOR MIGRAINE HEADACHE    Contraindications and precautions discussed with patient(above). Aseptic procedure was observed and patient tolerated procedure. Procedure performed by Dr. Georgia Dom  The condition has existed for more than 6 months, and pt does not have a diagnosis of ALS, Myasthenia Gravis or Lambert-Eaton Syndrome.  Risks and benefits of injections discussed and pt agrees to proceed with the procedure.  Written consent obtained  These injections are medically necessary. Pt  receives good benefits from these injections. These injections do not cause sedations or hallucinations which the oral therapies may cause.  Description of procedure:  The patient was placed in a sitting position. The standard protocol was used for Botox as follows, with 5 units of Botox injected at each site:   -Procerus muscle, midline injection  -Corrugator muscle, bilateral injection  -Frontalis muscle, bilateral injection, with 2 sites each side, medial injection was performed in the upper one third of the frontalis muscle, in the region vertical from the medial inferior edge of the superior orbital rim. The lateral injection was again in the upper one third of the forehead vertically above the lateral limbus of the cornea, 1.5 cm lateral to the medial injection site.  -Temporalis  muscle injection, 4 sites, bilaterally. The first injection was 3 cm above the tragus of the ear, second injection site was 1.5 cm to 3 cm up from the first injection site in line with the tragus of the ear. The third  injection site was 1.5-3 cm forward between the first 2 injection sites. The fourth injection site was 1.5 cm posterior to the second injection site.   -Occipitalis muscle injection, 3 sites, bilaterally. The first injection was done one half way between the occipital protuberance and the tip of the mastoid process behind the ear. The second injection site was done lateral and superior to the first, 1 fingerbreadth from the first injection. The third injection site was 1 fingerbreadth superiorly and medially from the first injection site.  -Cervical paraspinal muscle injection, 2 sites, bilateral knee first injection site was 1 cm from the midline of the cervical spine, 3 cm inferior to the lower border of the occipital protuberance. The second injection site was 1.5 cm superiorly and laterally to the first injection site.  -Trapezius muscle injection was performed at 3 sites, bilaterally. The first injection site was in the upper trapezius muscle halfway between the inflection point of the neck, and the acromion. The second injection site was one half way between the acromion and the first injection site. The third injection was done between the first injection site and the inflection point of the neck.   Will return for repeat injection in 3 months.   155 units of Botox was used, 45u Botox not injected was wasted. The patient tolerated the procedure well, there were no complications of the above procedure.

## 2020-10-29 NOTE — Progress Notes (Signed)
Botox- 200 units x 1 vial Lot: [id number] Expiration: 04/2023 NDC: 5320-2334-35  Bacteriostatic 0.9% Sodium Chloride- 22mL total Lot: [id number] Expiration: 01/12/2022 NDC: 3729-0211-15  Dx: Z20.802 B/B

## 2021-01-07 ENCOUNTER — Encounter: Payer: Self-pay | Admitting: Internal Medicine

## 2021-01-14 ENCOUNTER — Telehealth: Payer: Self-pay | Admitting: Neurology

## 2021-01-14 NOTE — Telephone Encounter (Signed)
Patient's next Xeomin injection is 1/17. Patient's PA through [facility] expired 01/11/21. I filled out [employer]'s PA form, requesting 200/155 units of Botox ([id number]) every 12 weeks for G43.709. Faxed to plan, pending.

## 2021-01-15 ENCOUNTER — Other Ambulatory Visit: Payer: Self-pay | Admitting: Internal Medicine

## 2021-01-15 DIAGNOSIS — Z1231 Encounter for screening mammogram for malignant neoplasm of breast: Secondary | ICD-10-CM

## 2021-01-15 NOTE — Telephone Encounter (Signed)
Received approval from [facility]. [place] #[id number] (01/13/20- 01/11/22).

## 2021-01-27 ENCOUNTER — Other Ambulatory Visit: Payer: Self-pay

## 2021-01-27 ENCOUNTER — Encounter: Payer: Self-pay | Admitting: Internal Medicine

## 2021-01-27 ENCOUNTER — Ambulatory Visit (INDEPENDENT_AMBULATORY_CARE_PROVIDER_SITE_OTHER): Payer: Medicare PPO | Admitting: Internal Medicine

## 2021-01-27 VITALS — BP 104/66 | HR 72 | Temp 97.8°F | Ht 61.25 in | Wt 129.0 lb

## 2021-01-27 DIAGNOSIS — Z0000 Encounter for general adult medical examination without abnormal findings: Secondary | ICD-10-CM

## 2021-01-27 DIAGNOSIS — G43709 Chronic migraine without aura, not intractable, without status migrainosus: Secondary | ICD-10-CM | POA: Diagnosis not present

## 2021-01-27 DIAGNOSIS — Z7189 Other specified counseling: Secondary | ICD-10-CM | POA: Diagnosis not present

## 2021-01-27 NOTE — Assessment & Plan Note (Signed)
Has long standing DNR

## 2021-01-27 NOTE — Progress Notes (Signed)
° °  Subjective:    Patient ID: Jacqueline Meza, female    DOB: [birth year], 76 y.o.   MRN: [id number]  HPI Here for Medicare wellness visit and follow up of chronic health conditions Reviewed advanced directives Reviewed other doctors--_Dr Dub Mikes, Dr Ahern--neurology/headache, Dr Maryellen Pile    Review of Systems     Objective:   Physical Exam         Assessment & Plan:

## 2021-01-27 NOTE — Progress Notes (Signed)
Hearing Screening - Comments:: Passed whisper test Vision Screening - Comments:: January 2022. Has appt coming up.

## 2021-01-27 NOTE — Assessment & Plan Note (Signed)
I have personally reviewed the Medicare Annual Wellness questionnaire and have noted 1. The patient's medical and social history 2. Their use of alcohol, tobacco or illicit drugs 3. Their current medications and supplements 4. The patient's functional ability including ADL's, fall risks, home safety risks and hearing or visual             impairment. 5. Diet and physical activities 6. Evidence for depression or mood disorders  The patients weight, height, BMI and visual acuity have been recorded in the chart I have made referrals, counseling and provided education to the patient based review of the above and I have provided the pt with a written personalized care plan for preventive services.  I have provided you with a copy of your personalized plan for preventive services. Please take the time to review along with your updated medication list.  Healthy Colon (last screening) due 2025 Yearly mammogram--scheduled for next month Stays active shingrix when covered Recommended bivalent COVID

## 2021-01-27 NOTE — Progress Notes (Signed)
Subjective:    Patient ID: Jacqueline Meza, female    DOB: 06/27/1945, 76 y.o.   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     Galcanezumab-gnlm (EMGALITY) 120 MG/ML SOAJ Inject 1 mL into the skin every 30 (thirty) days. 1 mL 11   Multiple Vitamin (MULTIVITAMIN) tablet Take 1 tablet by mouth daily.     SUMAtriptan (IMITREX) 100 MG tablet Take 1 tablet (100 mg total) by mouth as needed. 10 tablet 11   topiramate (TOPAMAX) 100 MG tablet TAKE 1 TABLET(100 MG) BY MOUTH TWICE DAILY 180 tablet 3   No current facility-administered medications on file prior to visit.    Allergies  Allergen Reactions   Codeine Sulfate     REACTION: headache and nausea    Past Medical History:  Diagnosis Date   Actinic keratosis 12/12/2007   R leg - bx proven    Basal cell carcinoma 01/04/2007   L upper back - superficial    Basal cell carcinoma 11/08/2013   L ant scalp    Basal cell carcinoma 12/10/2014   L nasal tip    Dysplastic nevus 01/23/2008   R ant ankle - moderate   Dysplastic nevus 11/08/2012   R prox dorsum of foot - mild     Endometriosis    Hyperlipidemia    Migraines    since childhood   Raynaud's phenomenon     Past Surgical History:  Procedure Laterality Date   ABDOMINAL HYSTERECTOMY  01/12/1993   endometriosis and worsening migraines   CESAREAN SECTION  1988   COLONOSCOPY  2005   Dr Bary Castilla   DILATION AND CURETTAGE OF UTERUS     done after miscarriages twice    Family History  Problem Relation Age of Onset   Migraines Sister    Cancer Father        lymphoma   Bone cancer Father    Migraines Father    Kidney failure Mother    Migraines Daughter    Cancer Paternal Uncle 53       colon   Cancer Paternal Aunt        breast   Breast cancer Paternal Aunt        late 18's   Migraines Paternal Aunt    CAD Neg Hx    Stroke Neg Hx    Diabetes Neg Hx     Social History   Socioeconomic History   Marital status: Divorced    Spouse name: Not on file   Number of children: 1   Years of education: Not on file   Highest education level: Not on file  Occupational History   Occupation: Retired--curriculum facilitator    Comment: [employer]   Occupation: Teaching part time--math intervention  Comment: retired   Occupation: Microbiologist houses part time    Comment:    Tobacco Use   Smoking status: Never   Smokeless tobacco: Never  Substance and Sexual Activity   Alcohol use: Yes    Alcohol/week: 7.0 standard drinks    Types: 7 Standard drinks or equivalent per week    Comment: 1 drink a night, wine or vodka sometimes   Drug use: No   Sexual activity: Never  Other Topics Concern   Not on file  Social History Narrative   Has living will   Requests daughter Addy as health care POA.   Requests DNR after discussion --form done 04/22/12   No tube feedings if cognitively unaware         Update 09/26/2019   Lives alone    Right handed   Caffeine: 1 cup/day   Social Determinants of Health   Financial Resource Strain: Not on file  Food Insecurity: Not on file   Transportation Needs: Not on file  Physical Activity: Not on file  Stress: Not on file  Social Connections: Not on file  Intimate Partner Violence: Not on file   Review of Systems Appetite is good Weight is stable Sleeps fair---no sig daytime somnolence Wears seat belt Teeth are fine---does see dentist No heartburn. Some trouble with meat--if she doesn't chew it well Bowels move fine---no blood No dysuria or hematuria No chest pain or SOB No dizziness or syncope No sig joint pains    Objective:   Physical Exam Constitutional:      Appearance: Normal appearance.  HENT:     Mouth/Throat:     Comments: No lesions Eyes:     Conjunctiva/sclera: Conjunctivae normal.     Pupils: Pupils are equal, round, and reactive to light.  Cardiovascular:     Rate and Rhythm: Normal rate and regular rhythm.     Pulses: Normal pulses.     Heart sounds: No murmur heard.   No gallop.  Pulmonary:     Effort: Pulmonary effort is normal.     Breath sounds: Normal breath sounds. No wheezing or rales.  Abdominal:     Palpations: Abdomen is soft.     Tenderness: There is no abdominal tenderness.  Musculoskeletal:     Cervical back: Neck supple.     Right lower leg: No edema.     Left lower leg: No edema.  Lymphadenopathy:     Cervical: No cervical adenopathy.  Skin:    Findings: No lesion or rash.  Neurological:     Mental Status: She is alert and oriented to person, place, and time.     Comments: Mini-cog normal  Psychiatric:        Mood and Affect: Mood normal.        Behavior: Behavior normal.           Assessment & Plan:

## 2021-01-27 NOTE — Assessment & Plan Note (Signed)
Doing better now with botox and emgality 49ml monthly Has the sumatriptan for prn use

## 2021-01-28 ENCOUNTER — Ambulatory Visit: Payer: Medicare PPO | Admitting: Neurology

## 2021-01-28 DIAGNOSIS — G43709 Chronic migraine without aura, not intractable, without status migrainosus: Secondary | ICD-10-CM

## 2021-01-28 MED ORDER — EMGALITY 120 MG/ML ~~LOC~~ SOAJ: 1.0000 mL | SUBCUTANEOUS | 11 refills | Status: DC

## 2021-01-28 NOTE — Progress Notes (Signed)
Consent Form Botulism Toxin Injection For Chronic Migraine    01/28/2021: hasn't had one migraine or headache tremendous improvement.   10/29/2020:  Still excellent response >> 75% improvement freq and severity. She has two 74 year old twin grandchildren, a boy and a girl, kindergarten.   Reviewed orally with patient, additionally signature is on file:  Botulism toxin has been approved by the Federal drug administration for treatment of chronic migraine. Botulism toxin does not cure chronic migraine and it may not be effective in some patients.  The administration of botulism toxin is accomplished by injecting a small amount of toxin into the muscles of the neck and head. Dosage must be titrated for each individual. Any benefits resulting from botulism toxin tend to wear off after 3 months with a repeat injection required if benefit is to be maintained. Injections are usually done every 3-4 months with maximum effect peak achieved by about 2 or 3 weeks. Botulism toxin is expensive and you should be sure of what costs you will incur resulting from the injection.  The side effects of botulism toxin use for chronic migraine may include:   -Transient, and usually mild, facial weakness with facial injections  -Transient, and usually mild, head or neck weakness with head/neck injections  -Reduction or loss of forehead facial animation due to forehead muscle weakness  -Eyelid drooping  -Dry eye  -Pain at the site of injection or bruising at the site of injection  -Double vision  -Potential unknown long term risks  Contraindications: You should not have Botox if you are pregnant, nursing, allergic to albumin, have an infection, skin condition, or muscle weakness at the site of the injection, or have myasthenia gravis, Lambert-Eaton syndrome, or ALS.  It is also possible that as with any injection, there may be an allergic reaction or no effect from the medication. Reduced effectiveness after  repeated injections is sometimes seen and rarely infection at the injection site may occur. All care will be taken to prevent these side effects. If therapy is given over a long time, atrophy and wasting in the muscle injected may occur. Occasionally the patient's become refractory to treatment because they develop antibodies to the toxin. In this event, therapy needs to be modified.  I have read the above information and consent to the administration of botulism toxin.    BOTOX PROCEDURE NOTE FOR MIGRAINE HEADACHE    Contraindications and precautions discussed with patient(above). Aseptic procedure was observed and patient tolerated procedure. Procedure performed by Dr. Georgia Dom  The condition has existed for more than 6 months, and pt does not have a diagnosis of ALS, Myasthenia Gravis or Lambert-Eaton Syndrome.  Risks and benefits of injections discussed and pt agrees to proceed with the procedure.  Written consent obtained  These injections are medically necessary. Pt  receives good benefits from these injections. These injections do not cause sedations or hallucinations which the oral therapies may cause.  Description of procedure:  The patient was placed in a sitting position. The standard protocol was used for Botox as follows, with 5 units of Botox injected at each site:   -Procerus muscle, midline injection  -Corrugator muscle, bilateral injection  -Frontalis muscle, bilateral injection, with 2 sites each side, medial injection was performed in the upper one third of the frontalis muscle, in the region vertical from the medial inferior edge of the superior orbital rim. The lateral injection was again in the upper one third of the forehead vertically above the lateral limbus  of the cornea, 1.5 cm lateral to the medial injection site.  -Temporalis muscle injection, 4 sites, bilaterally. The first injection was 3 cm above the tragus of the ear, second injection site was 1.5 cm to 3  cm up from the first injection site in line with the tragus of the ear. The third injection site was 1.5-3 cm forward between the first 2 injection sites. The fourth injection site was 1.5 cm posterior to the second injection site.   -Occipitalis muscle injection, 3 sites, bilaterally. The first injection was done one half way between the occipital protuberance and the tip of the mastoid process behind the ear. The second injection site was done lateral and superior to the first, 1 fingerbreadth from the first injection. The third injection site was 1 fingerbreadth superiorly and medially from the first injection site.  -Cervical paraspinal muscle injection, 2 sites, bilateral knee first injection site was 1 cm from the midline of the cervical spine, 3 cm inferior to the lower border of the occipital protuberance. The second injection site was 1.5 cm superiorly and laterally to the first injection site.  -Trapezius muscle injection was performed at 3 sites, bilaterally. The first injection site was in the upper trapezius muscle halfway between the inflection point of the neck, and the acromion. The second injection site was one half way between the acromion and the first injection site. The third injection was done between the first injection site and the inflection point of the neck.   Will return for repeat injection in 3 months.   155 units of Botox was used, 45u Botox not injected was wasted. The patient tolerated the procedure well, there were no complications of the above procedure.

## 2021-01-28 NOTE — Progress Notes (Signed)
Botox- 200 units x 1 vial Lot: [id number] Expiration: 09/2023 NDC: 6378-5885-02  Bacteriostatic 0.9% Sodium Chloride- 4mL total Lot: [id number] Expiration: 08/13/2022 NDC: 7741-2878-67  Dx: E72.094 B/B

## 2021-02-17 ENCOUNTER — Other Ambulatory Visit: Payer: Self-pay | Admitting: Internal Medicine

## 2021-02-17 NOTE — Telephone Encounter (Signed)
Received approval from [facility]. Reference #[id number] (01/12/2021-01/11/2022).

## 2021-03-05 ENCOUNTER — Other Ambulatory Visit: Payer: Self-pay | Admitting: Internal Medicine

## 2021-03-11 ENCOUNTER — Other Ambulatory Visit: Payer: Self-pay

## 2021-03-11 ENCOUNTER — Ambulatory Visit
Admission: RE | Admit: 2021-03-11 | Discharge: 2021-03-11 | Disposition: A | Discharge status: Home / Self Care | Payer: Medicare PPO | Source: Ambulatory Visit | Attending: Internal Medicine | Admitting: Internal Medicine

## 2021-03-11 DIAGNOSIS — Z1231 Encounter for screening mammogram for malignant neoplasm of breast: Secondary | ICD-10-CM | POA: Insufficient documentation

## 2021-03-26 ENCOUNTER — Ambulatory Visit: Payer: Medicare PPO | Admitting: Dermatology

## 2021-03-26 ENCOUNTER — Other Ambulatory Visit: Payer: Self-pay

## 2021-03-26 DIAGNOSIS — L820 Inflamed seborrheic keratosis: Secondary | ICD-10-CM

## 2021-03-26 DIAGNOSIS — D2271 Melanocytic nevi of right lower limb, including hip: Secondary | ICD-10-CM

## 2021-03-26 DIAGNOSIS — Z86018 Personal history of other benign neoplasm: Secondary | ICD-10-CM

## 2021-03-26 DIAGNOSIS — L2089 Other atopic dermatitis: Secondary | ICD-10-CM

## 2021-03-26 DIAGNOSIS — Z85828 Personal history of other malignant neoplasm of skin: Secondary | ICD-10-CM

## 2021-03-26 DIAGNOSIS — D229 Melanocytic nevi, unspecified: Secondary | ICD-10-CM | POA: Diagnosis not present

## 2021-03-26 DIAGNOSIS — L814 Other melanin hyperpigmentation: Secondary | ICD-10-CM

## 2021-03-26 DIAGNOSIS — Z1283 Encounter for screening for malignant neoplasm of skin: Secondary | ICD-10-CM

## 2021-03-26 DIAGNOSIS — L821 Other seborrheic keratosis: Secondary | ICD-10-CM

## 2021-03-26 DIAGNOSIS — D692 Other nonthrombocytopenic purpura: Secondary | ICD-10-CM | POA: Diagnosis not present

## 2021-03-26 DIAGNOSIS — L578 Other skin changes due to chronic exposure to nonionizing radiation: Secondary | ICD-10-CM

## 2021-03-26 DIAGNOSIS — D1800 Hemangioma unspecified site: Secondary | ICD-10-CM

## 2021-03-26 MED ORDER — MOMETASONE FUROATE 0.1 % EX CREA: TOPICAL_CREAM | CUTANEOUS | 1 refills | Status: DC

## 2021-03-26 NOTE — Progress Notes (Signed)
? ?Follow-Up Visit ?  ?Subjective  ?Jacqueline Meza is a 76 y.o. female who presents for the following: Annual Exam (Patient here for full body skin exam and skin cancer screening. Patient with hx of BCC, dysplastic nevi and AK's. She does have a wart like spot at right forehead, present for about 6 months. ) and Rash (Patient developed rash 1 month ago at right hand after helping her sister clean a house. She had some TMC that she used which got rid of the rash but her hand is still dry, cracked and sometimes burns. ). ?The patient presents for Total-Body Skin Exam (TBSE) for skin cancer screening and mole check.  The patient has spots, moles and lesions to be evaluated, some may be new or changing and the patient has concerns that these could be cancer. ? ?The following portions of the chart were reviewed this encounter and updated as appropriate:  ? Tobacco  Allergies  Meds  Problems  Med Hx  Surg Hx  Fam Hx   ?  ?Review of Systems:  No other skin or systemic complaints except as noted in HPI or Assessment and Plan. ? ?Objective  ?Well appearing patient in no apparent distress; mood and affect are within normal limits. ? ?A full examination was performed including scalp, head, eyes, ears, nose, lips, neck, chest, axillae, abdomen, back, buttocks, bilateral upper extremities, bilateral lower extremities, hands, feet, fingers, toes, fingernails, and toenails. All findings within normal limits unless otherwise noted below. ? ?right hand ?Patchy scaliness at fingers and hands ? ?right forehead ?Erythematous stuck-on, waxy papule or plaque ? ?Right anterior sole ?0.3 cm brown macule ? ? ? ? ? ? ? ?Assessment & Plan  ?Other atopic dermatitis ?right hand ?Start mometasone cream 1-2 times daily as needed for rash at hand up to 5 days a week. Marland Kitchen Avoid applying to face, groin, and axilla. Use as directed. Long-term use can cause thinning of the skin. ? ?Atopic dermatitis (eczema) is a chronic, relapsing, pruritic  condition that can significantly affect quality of life. It is often associated with allergic rhinitis and/or asthma and can require treatment with topical medications, phototherapy, or in severe cases biologic injectable medication (Dupixent; Adbry) or Oral JAK inhibitors. ? ?Topical steroids (such as triamcinolone, fluocinolone, fluocinonide, mometasone, clobetasol, halobetasol, betamethasone, hydrocortisone) can cause thinning and lightening of the skin if they are used for too long in the same area. Your physician has selected the right strength medicine for your problem and area affected on the body. Please use your medication only as directed by your physician to prevent side effects.  ? ?mometasone (ELOCON) 0.1 % cream - right hand ?Apply 1-2 times daily as needed for rash at hand up to 5 days a week. Avoid applying to face, groin, and axilla. Use as directed. Long-term use can cause thinning of the skin. ? ?Inflamed seborrheic keratosis ?right forehead ?Destruction of lesion - right forehead ?Complexity: simple   ?Destruction method: cryotherapy   ?Informed consent: discussed and consent obtained   ?Timeout:  patient name, date of birth, surgical site, and procedure verified ?Lesion destroyed using liquid nitrogen: Yes   ?Region frozen until ice ball extended beyond lesion: Yes   ?Outcome: patient tolerated procedure well with no complications   ?Post-procedure details: wound care instructions given   ? ?Nevus ?Right anterior sole ?See photo ?Benign-appearing.  Observation.  Call clinic for new or changing lesions.  Recommend daily use of broad spectrum spf 30+ sunscreen to sun-exposed areas.  ? ?  Skin cancer screening ? ?Lentigines ?- Scattered tan macules ?- Due to sun exposure ?- Benign-appearing, observe ?- Recommend daily broad spectrum sunscreen SPF 30+ to sun-exposed areas, reapply every 2 hours as needed. ?- Call for any changes ? ?Seborrheic Keratoses ?- Stuck-on, waxy, tan-brown papules and/or  plaques  ?- Benign-appearing ?- Discussed benign etiology and prognosis. ?- Observe ?- Call for any changes ? ?Melanocytic Nevi ?- Tan-brown and/or pink-flesh-colored symmetric macules and papules ?- Benign appearing on exam today ?- Observation ?- Call clinic for new or changing moles ?- Recommend daily use of broad spectrum spf 30+ sunscreen to sun-exposed areas.  ? ?Hemangiomas ?- Red papules ?- Discussed benign nature ?- Observe ?- Call for any changes ? ?Actinic Damage ?- Chronic condition, secondary to cumulative UV/sun exposure ?- diffuse scaly erythematous macules with underlying dyspigmentation ?- Recommend daily broad spectrum sunscreen SPF 30+ to sun-exposed areas, reapply every 2 hours as needed.  ?- Staying in the shade or wearing long sleeves, sun glasses (UVA+UVB protection) and wide brim hats (4-inch brim around the entire circumference of the hat) are also recommended for sun protection.  ?- Call for new or changing lesions. ? ?Skin cancer screening performed today. ? ?History of Basal Cell Carcinoma of the Skin ?- No evidence of recurrence today ?- Recommend regular full body skin exams ?- Recommend daily broad spectrum sunscreen SPF 30+ to sun-exposed areas, reapply every 2 hours as needed.  ?- Call if any new or changing lesions are noted between office visits ? ?History of Dysplastic Nevi ?- No evidence of recurrence today ?- Recommend regular full body skin exams ?- Recommend daily broad spectrum sunscreen SPF 30+ to sun-exposed areas, reapply every 2 hours as needed.  ?- Call if any new or changing lesions are noted between office visits ? ?Purpura - Chronic; persistent and recurrent.  Treatable, but not curable. ?- Violaceous macules and patches ?- Benign ?- Related to trauma, age, sun damage and/or use of blood thinners, chronic use of topical and/or oral steroids ?- Observe ?- Can use OTC arnica containing moisturizer such as Dermend Bruise Formula if desired ?- Call for worsening or other  concerns ? ?Return in about 1 year (around 03/27/2022) for TBSE. ? ?Graciella Belton, RMA, am acting as scribe for Sarina Ser, MD . ?Documentation: I have reviewed the above documentation for accuracy and completeness, and I agree with the above. ? ?Sarina Ser, MD ? ?

## 2021-03-26 NOTE — Patient Instructions (Addendum)
Cryotherapy Aftercare ? ?Wash gently with soap and water everyday.   ?Apply Vaseline and Band-Aid daily until healed.  ? ?Start mometasone cream 1-2 times daily as needed for rash at hand up to 5 days a week. Marland Kitchen Avoid applying to face, groin, and axilla. Use as directed. Long-term use can cause thinning of the skin. ? ?Topical steroids (such as triamcinolone, fluocinolone, fluocinonide, mometasone, clobetasol, halobetasol, betamethasone, hydrocortisone) can cause thinning and lightening of the skin if they are used for too long in the same area. Your physician has selected the right strength medicine for your problem and area affected on the body. Please use your medication only as directed by your physician to prevent side effects.  ? ? ? ?Melanoma ABCDEs ? ?Melanoma is the most dangerous type of skin cancer, and is the leading cause of death from skin disease.  You are more likely to develop melanoma if you: ?Have light-colored skin, light-colored eyes, or red or blond hair ?Spend a lot of time in the sun ?Tan regularly, either outdoors or in a tanning bed ?Have had blistering sunburns, especially during childhood ?Have a close family member who has had a melanoma ?Have atypical moles or large birthmarks ? ?Early detection of melanoma is key since treatment is typically straightforward and cure rates are extremely high if we catch it early.  ? ?The first sign of melanoma is often a change in a mole or a new dark spot.  The ABCDE system is a way of remembering the signs of melanoma. ? ?A for asymmetry:  The two halves do not match. ?B for border:  The edges of the growth are irregular. ?C for color:  A mixture of colors are present instead of an even brown color. ?D for diameter:  Melanomas are usually (but not always) greater than 23m - the size of a pencil eraser. ?E for evolution:  The spot keeps changing in size, shape, and color. ? ?Please check your skin once per month between visits. You can use a small  mirror in front and a large mirror behind you to keep an eye on the back side or your body.  ? ?If you see any new or changing lesions before your next follow-up, please call to schedule a visit. ? ?Please continue daily skin protection including broad spectrum sunscreen SPF 30+ to sun-exposed areas, reapplying every 2 hours as needed when you're outdoors.   ? ?If You Need Anything After Your Visit ? ?If you have any questions or concerns for your doctor, please call our main line at [phone] press option 4 to reach your doctor's medical assistant. If no one answers, please leave a voicemail as directed and we will return your call as soon as possible. Messages left after 4 pm will be answered the following business day.  ? ?You may also send [place] message via MyChart. We typically respond to MyChart messages within 1-2 business days. ? ?For prescription refills, please ask your pharmacy to contact our office. Our fax number is [phone] ? ?If you have an urgent issue when the clinic is closed that cannot wait until the next business day, you can page your doctor at the number below.   ? ?Please note that while we do our best to be available for urgent issues outside of office hours, we are not available 24/7.  ? ?If you have an urgent issue and are unable to reach [place] you may choose to seek medical care at your doctor's  office, retail clinic, urgent care center, or emergency room. ? ?If you have a medical emergency, please immediately call 911 or go to the emergency department. ? ?Pager Numbers ? ?- Dr. Nehemiah Massed: [phone] ? ?- Dr. Laurence Ferrari: [phone] ? ?- Dr. Nicole Kindred: [phone] ? ?In the event of inclement weather, please call our main line at [phone] for an update on the status of any delays or closures. ? ?Dermatology Medication Tips: ?Please keep the boxes that topical medications come in in order to help keep track of the instructions about where and how to use these. Pharmacies typically  print the medication instructions only on the boxes and not directly on the medication tubes.  ? ?If your medication is too expensive, please contact our office at [phone] option 4 or send [place] a message through [place].  ? ?We are unable to tell what your co-pay for medications will be in advance as this is different depending on your insurance coverage. However, we may be able to find a substitute medication at lower cost or fill out paperwork to get insurance to cover a needed medication.  ? ?If a prior authorization is required to get your medication covered by your insurance company, please allow [place] 1-2 business days to complete this process. ? ?Drug prices often vary depending on where the prescription is filled and some pharmacies may offer cheaper prices. ? ?The website www.goodrx.com contains coupons for medications through different pharmacies. The prices here do not account for what the cost may be with help from insurance (it may be cheaper with your insurance), but the website can give you the price if you did not use any insurance.  ?- You can print the associated coupon and take it with your prescription to the pharmacy.  ?- You may also stop by our office during regular business hours and pick up a GoodRx coupon card.  ?- If you need your prescription sent electronically to a different pharmacy, notify our office through [facility] or by phone at [phone] option 4. ? ? ? ? ?Si Usted Necesita Algo Despu?s de Su Visita ? ?Tambi?n puede enviarnos un mensaje a trav?s de MyChart. Por lo general respondemos a los mensajes de MyChart en el transcurso de 1 a 2 d?as h?biles. ? ?Para renovar recetas, por favor pida a su farmacia que se ponga en contacto con nuestra oficina. Nuestro n?mero de fax es el [phone]. ? ?Si tiene un asunto urgente cuando la cl?nica est? cerrada y que no puede esperar hasta el siguiente d?a h?bil, puede llamar/localizar a su doctor(a) al n?mero que aparece a  continuaci?n.  ? ?Por favor, tenga en cuenta que aunque hacemos todo lo posible para estar disponibles para asuntos urgentes fuera del horario de oficina, no estamos disponibles las 24 horas del d?a, los 7 d?as de la semana.  ? ?Si tiene un problema urgente y no puede comunicarse con nosotros, puede optar por buscar atenci?n m?dica  en el consultorio de su doctor(a), en una cl?nica privada, en un centro de atenci?n urgente o en una sala de emergencias. ? ?Si tiene Engineer, maintenance (IT) m?dica, por favor llame inmediatamente al 911 o vaya a la sala de emergencias. ? ?N?meros de b?per ? ?- Dr. Nehemiah Massed: [phone] ? ?- Dra. Moye: [phone] ? ?- Dra. Nicole Kindred: [phone] ? ?En caso de inclemencias del tiempo, por favor llame a nuestra l?nea principal al [phone] para una actualizaci?n sobre el estado de cualquier retraso o cierre. ? ?Consejos para la medicaci?n en dermatolog?a: ?Por favor,  guarde las [employer] vienen los medicamentos de uso t?pico para ayudarle a seguir las instrucciones sobre d?nde y c?mo usarlos. Las farmacias generalmente imprimen las instrucciones del medicamento s?lo en las cajas y no directamente en los tubos del [place].  ? ?Si su medicamento es muy caro, por favor, p?ngase en contacto con Zigmund Daniel llamando al [phone] y presione la opci?n 4 o env?enos un mensaje a trav?s de MyChart.  ? ?No podemos decirle cu?l ser? su copago por los medicamentos por adelantado ya que esto es diferente dependiendo de la cobertura de su seguro. Sin embargo, es posible que podamos encontrar un medicamento sustituto a Electrical engineer un formulario para que el seguro cubra el medicamento que se considera necesario.  ? ?Si se requiere Ardelia Mems autorizaci?n previa para que su compa??a de seguros [place] su medicamento, por favor perm?tanos de 1 a 2 d?as h?biles para completar este proceso. ? ?Los precios de los medicamentos var?an con frecuencia dependiendo del Environmental consultant de d?nde se surte la receta  y alguna farmacias pueden ofrecer precios m?s baratos. ? ?El sitio web www.goodrx.com tiene cupones para medicamentos de Airline pilot. Los precios aqu? no tienen en cuenta lo que podr?a costar con la ayu

## 2021-04-01 ENCOUNTER — Encounter: Payer: Self-pay | Admitting: Dermatology

## 2021-04-22 ENCOUNTER — Ambulatory Visit: Payer: Medicare PPO | Admitting: Neurology

## 2021-04-25 ENCOUNTER — Other Ambulatory Visit: Payer: Self-pay | Admitting: Neurology

## 2021-04-25 DIAGNOSIS — G43709 Chronic migraine without aura, not intractable, without status migrainosus: Secondary | ICD-10-CM

## 2021-04-30 ENCOUNTER — Ambulatory Visit: Payer: Medicare PPO | Admitting: Neurology

## 2021-04-30 DIAGNOSIS — G43709 Chronic migraine without aura, not intractable, without status migrainosus: Secondary | ICD-10-CM | POA: Diagnosis not present

## 2021-04-30 NOTE — Progress Notes (Signed)
Botox- 200 units x 1 vial ?Lot: [id number]  ?Expiration: 12/2023 ?NDC: 4356-8616-83 ? ?0.9% Sodium Chloride- 2m total ?Lot: [id number]?Expiration: 02/12/2022 ?[place] [address]-22? ?Dx: GV36.122?B/B ? ?

## 2021-04-30 NOTE — Progress Notes (Signed)
Consent Form ?Botulism Toxin Injection For Chronic Migraine ? ? ? ?05/05/2021: stable ?01/28/2021: hasn't had one migraine or headache tremendous improvement.  ? ?10/29/2020:  Still excellent response >> 75% improvement freq and severity. She has two 74 year old twin grandchildren, a boy and a girl, kindergarten.  ? ?Reviewed orally with patient, additionally signature is on file: ? ?Botulism toxin has been approved by the Federal drug administration for treatment of chronic migraine. Botulism toxin does not cure chronic migraine and it may not be effective in some patients. ? ?The administration of botulism toxin is accomplished by injecting a small amount of toxin into the muscles of the neck and head. Dosage must be titrated for each individual. Any benefits resulting from botulism toxin tend to wear off after 3 months with a repeat injection required if benefit is to be maintained. Injections are usually done every 3-4 months with maximum effect peak achieved by about 2 or 3 weeks. Botulism toxin is expensive and you should be sure of what costs you will incur resulting from the injection. ? ?The side effects of botulism toxin use for chronic migraine may include: ? ? -Transient, and usually mild, facial weakness with facial injections ? -Transient, and usually mild, head or neck weakness with head/neck injections ? -Reduction or loss of forehead facial animation due to forehead muscle weakness ? -Eyelid drooping ? -Dry eye ? -Pain at the site of injection or bruising at the site of injection ? -Double vision ? -Potential unknown long term risks ? ?Contraindications: You should not have Botox if you are pregnant, nursing, allergic to albumin, have an infection, skin condition, or muscle weakness at the site of the injection, or have myasthenia gravis, Lambert-Eaton syndrome, or ALS. ? ?It is also possible that as with any injection, there may be an allergic reaction or no effect from the medication. Reduced  effectiveness after repeated injections is sometimes seen and rarely infection at the injection site may occur. All care will be taken to prevent these side effects. If therapy is given over a long time, atrophy and wasting in the muscle injected may occur. Occasionally the patient's become refractory to treatment because they develop antibodies to the toxin. In this event, therapy needs to be modified. ? ?I have read the above information and consent to the administration of botulism toxin. ? ? ? ?BOTOX PROCEDURE NOTE FOR MIGRAINE HEADACHE ? ? ? ?Contraindications and precautions discussed with patient(above). Aseptic procedure was observed and patient tolerated procedure. Procedure performed by Dr. Georgia Dom ? ?The condition has existed for more than 6 months, and pt does not have a diagnosis of ALS, Myasthenia Gravis or Lambert-Eaton Syndrome.  Risks and benefits of injections discussed and pt agrees to proceed with the procedure.  Written consent obtained ? ?These injections are medically necessary. Pt  receives good benefits from these injections. These injections do not cause sedations or hallucinations which the oral therapies may cause. ? ?Description of procedure: ? ?The patient was placed in a sitting position. The standard protocol was used for Botox as follows, with 5 units of Botox injected at each site: ? ? ?-Procerus muscle, midline injection ? ?-Corrugator muscle, bilateral injection ? ?-Frontalis muscle, bilateral injection, with 2 sites each side, medial injection was performed in the upper one third of the frontalis muscle, in the region vertical from the medial inferior edge of the superior orbital rim. The lateral injection was again in the upper one third of the forehead vertically above the  lateral limbus of the cornea, 1.5 cm lateral to the medial injection site. ? ?-Temporalis muscle injection, 4 sites, bilaterally. The first injection was 3 cm above the tragus of the ear, second injection  site was 1.5 cm to 3 cm up from the first injection site in line with the tragus of the ear. The third injection site was 1.5-3 cm forward between the first 2 injection sites. The fourth injection site was 1.5 cm posterior to the second injection site.  ? ?-Occipitalis muscle injection, 3 sites, bilaterally. The first injection was done one half way between the occipital protuberance and the tip of the mastoid process behind the ear. The second injection site was done lateral and superior to the first, 1 fingerbreadth from the first injection. The third injection site was 1 fingerbreadth superiorly and medially from the first injection site. ? ?-Cervical paraspinal muscle injection, 2 sites, bilateral knee first injection site was 1 cm from the midline of the cervical spine, 3 cm inferior to the lower border of the occipital protuberance. The second injection site was 1.5 cm superiorly and laterally to the first injection site. ? ?-Trapezius muscle injection was performed at 3 sites, bilaterally. The first injection site was in the upper trapezius muscle halfway between the inflection point of the neck, and the acromion. The second injection site was one half way between the acromion and the first injection site. The third injection was done between the first injection site and the inflection point of the neck. ? ? ?Will return for repeat injection in 3 months. ? ? ?155 units of Botox was used, 45u Botox not injected was wasted. The patient tolerated the procedure well, there were no complications of the above procedure. ? ? ? ?

## 2021-06-30 ENCOUNTER — Ambulatory Visit: Payer: Medicare PPO | Admitting: Dermatology

## 2021-06-30 DIAGNOSIS — L578 Other skin changes due to chronic exposure to nonionizing radiation: Secondary | ICD-10-CM | POA: Diagnosis not present

## 2021-06-30 DIAGNOSIS — C44311 Basal cell carcinoma of skin of nose: Secondary | ICD-10-CM | POA: Diagnosis not present

## 2021-06-30 DIAGNOSIS — D492 Neoplasm of unspecified behavior of bone, soft tissue, and skin: Secondary | ICD-10-CM

## 2021-06-30 NOTE — Progress Notes (Signed)
   Follow-Up Visit   Subjective  Jacqueline Meza is a 76 y.o. female who presents for the following: check spot (L nasal tip, hx of bleeding ~2wks, hx of BCC L nasal tip).   The following portions of the chart were reviewed this encounter and updated as appropriate:       Review of Systems:  No other skin or systemic complaints except as noted in HPI or Assessment and Plan.  Objective  Well appearing patient in no apparent distress; mood and affect are within normal limits.  A focused examination was performed including face. Relevant physical exam findings are noted in the Assessment and Plan.  L nasal tip Light pink scaly macule 5.0 x 4.18m       Assessment & Plan  Neoplasm of skin L nasal tip  Skin / nail biopsy Type of biopsy: tangential   Informed consent: discussed and consent obtained   Anesthesia: the lesion was anesthetized in a standard fashion   Anesthesia comment:  Area prepped with alcohol Anesthetic:  1% lidocaine w/ epinephrine 1-100,000 buffered w/ 8.4% NaHCO3 Instrument used: flexible razor blade   Hemostasis achieved with: pressure, aluminum chloride and electrodesiccation   Outcome: patient tolerated procedure well   Post-procedure details: wound care instructions given   Post-procedure details comment:  Ointment and small bandage applied  Specimen 1 - Surgical pathology Differential Diagnosis: D48.5 R/O recurrent BCC vs other  Check Margins: No Light pink scaly macule 5.0 x 4.049m  Actinic Damage - chronic, secondary to cumulative UV radiation exposure/sun exposure over time - diffuse scaly erythematous macules with underlying dyspigmentation - Recommend daily broad spectrum sunscreen SPF 30+ to sun-exposed areas, reapply every 2 hours as needed.  - Recommend staying in the shade or wearing long sleeves, sun glasses (UVA+UVB protection) and wide brim hats (4-inch brim around the entire circumference of the hat). - Call for new or changing  lesions.   Return for f/u pending bx results.  I, SoOthelia PullingRMA, am acting as scribe for TaBrendolyn PattyMD .  Documentation: I have reviewed the above documentation for accuracy and completeness, and I agree with the above.  TaBrendolyn PattyD

## 2021-06-30 NOTE — Patient Instructions (Addendum)
Wound Care Instructions  Cleanse wound gently with soap and water once a day then pat dry with clean gauze. Apply a thing coat of Petrolatum (petroleum jelly, "Vaseline") over the wound (unless you have an allergy to this). We recommend that you use a new, sterile tube of Vaseline. Do not pick or remove scabs. Do not remove the yellow or white "healing tissue" from the base of the wound.  Cover the wound with fresh, clean, nonstick gauze and secure with paper tape. You may use Band-Aids in place of gauze and tape if the would is small enough, but would recommend trimming much of the tape off as there is often too much. Sometimes Band-Aids can irritate the skin.  You should call the office for your biopsy report after 1 week if you have not already been contacted.  If you experience any problems, such as abnormal amounts of bleeding, swelling, significant bruising, significant pain, or evidence of infection, please call the office immediately.  FOR ADULT SURGERY PATIENTS: If you need something for pain relief you may take 1 extra strength Tylenol (acetaminophen) AND 2 Ibuprofen (200mg each) together every 4 hours as needed for pain. (do not take these if you are allergic to them or if you have a reason you should not take them.) Typically, you may only need pain medication for 1 to 3 days.    Due to recent changes in healthcare laws, you may see results of your pathology and/or laboratory studies on MyChart before the doctors have had a chance to review them. We understand that in some cases there may be results that are confusing or concerning to you. Please understand that not all results are received at the same time and often the doctors may need to interpret multiple results in order to provide you with the best plan of care or course of treatment. Therefore, we ask that you please give us 2 business days to thoroughly review all your results before contacting the office for clarification. Should we  see a critical lab result, you will be contacted sooner.   If You Need Anything After Your Visit  If you have any questions or concerns for your doctor, please call our main line at [phone] and press option 4 to reach your doctor's medical assistant. If no one answers, please leave a voicemail as directed and we will return your call as soon as possible. Messages left after 4 pm will be answered the following business day.   You may also send us a message via MyChart. We typically respond to MyChart messages within 1-2 business days.  For prescription refills, please ask your pharmacy to contact our office. Our fax number is [phone].  If you have an urgent issue when the clinic is closed that cannot wait until the next business day, you can page your doctor at the number below.    Please note that while we do our best to be available for urgent issues outside of office hours, we are not available 24/7.   If you have an urgent issue and are unable to reach us, you may choose to seek medical care at your doctor's office, retail clinic, urgent care center, or emergency room.  If you have a medical emergency, please immediately call 911 or go to the emergency department.  Pager Numbers  - Dr. Kowalski: [phone]  - Dr. Moye: [phone]  - Dr. Stewart: [phone]  In the event of inclement weather, please call our main line at [phone]   for an update on the status of any delays or closures.  Dermatology Medication Tips: Please keep the boxes that topical medications come in in order to help keep track of the instructions about where and how to use these. Pharmacies typically print the medication instructions only on the boxes and not directly on the medication tubes.   If your medication is too expensive, please contact our office at [phone] option 4 or send us a message through MyChart.   We are unable to tell what your co-pay for medications will be in advance  as this is different depending on your insurance coverage. However, we may be able to find a substitute medication at lower cost or fill out paperwork to get insurance to cover a needed medication.   If a prior authorization is required to get your medication covered by your insurance company, please allow us 1-2 business days to complete this process.  Drug prices often vary depending on where the prescription is filled and some pharmacies may offer cheaper prices.  The website www.goodrx.com contains coupons for medications through different pharmacies. The prices here do not account for what the cost may be with help from insurance (it may be cheaper with your insurance), but the website can give you the price if you did not use any insurance.  - You can print the associated coupon and take it with your prescription to the pharmacy.  - You may also stop by our office during regular business hours and pick up a GoodRx coupon card.  - If you need your prescription sent electronically to a different pharmacy, notify our office through [facility] MyChart or by phone at [phone] option 4.     Si Usted Necesita Algo Despus de Su Visita  Tambin puede enviarnos un mensaje a travs de MyChart. Por lo general respondemos a los mensajes de MyChart en el transcurso de 1 a 2 das hbiles.  Para renovar recetas, por favor pida a su farmacia que se ponga en contacto con nuestra oficina. Nuestro nmero de fax es el [phone].  Si tiene un asunto urgente cuando la clnica est cerrada y que no puede esperar hasta el siguiente da hbil, puede llamar/localizar a su doctor(a) al nmero que aparece a continuacin.   Por favor, tenga en cuenta que aunque hacemos todo lo posible para estar disponibles para asuntos urgentes fuera del horario de oficina, no estamos disponibles las 24 horas del da, los 7 das de la semana.   Si tiene un problema urgente y no puede comunicarse con nosotros, puede optar  por buscar atencin mdica  en el consultorio de su doctor(a), en una clnica privada, en un centro de atencin urgente o en una sala de emergencias.  Si tiene una emergencia mdica, por favor llame inmediatamente al 911 o vaya a la sala de emergencias.  Nmeros de bper  - Dr. Kowalski: [phone]  - Dra. Moye: [phone]  - Dra. Stewart: [phone]  En caso de inclemencias del tiempo, por favor llame a nuestra lnea principal al [phone] para una actualizacin sobre el estado de cualquier retraso o cierre.  Consejos para la medicacin en dermatologa: Por favor, guarde las cajas en las que vienen los medicamentos de uso tpico para ayudarle a seguir las instrucciones sobre dnde y cmo usarlos. Las farmacias generalmente imprimen las instrucciones del medicamento slo en las cajas y no directamente en los tubos del medicamento.   Si su medicamento es muy caro, por favor, pngase en contacto con nuestra   oficina llamando al [phone] y presione la opcin 4 o envenos un mensaje a travs de MyChart.   No podemos decirle cul ser su copago por los medicamentos por adelantado ya que esto es diferente dependiendo de la cobertura de su seguro. Sin embargo, es posible que podamos encontrar un medicamento sustituto a menor costo o llenar un formulario para que el seguro cubra el medicamento que se considera necesario.   Si se requiere una autorizacin previa para que su compaa de seguros cubra su medicamento, por favor permtanos de 1 a 2 das hbiles para completar este proceso.  Los precios de los medicamentos varan con frecuencia dependiendo del lugar de dnde se surte la receta y alguna farmacias pueden ofrecer precios ms baratos.  El sitio web www.goodrx.com tiene cupones para medicamentos de diferentes farmacias. Los precios aqu no tienen en cuenta lo que podra costar con la ayuda del seguro (puede ser ms barato con su seguro), pero el sitio web puede darle el precio si  no utiliz ningn seguro.  - Puede imprimir el cupn correspondiente y llevarlo con su receta a la farmacia.  - Tambin puede pasar por nuestra oficina durante el horario de atencin regular y recoger una tarjeta de cupones de GoodRx.  - Si necesita que su receta se enve electrnicamente a una farmacia diferente, informe a nuestra oficina a travs de [facility]  o por telfono llamando al [phone] y presione la opcin 4.  

## 2021-07-07 ENCOUNTER — Other Ambulatory Visit: Payer: Self-pay

## 2021-07-07 ENCOUNTER — Telehealth: Payer: Self-pay

## 2021-07-07 DIAGNOSIS — C44311 Basal cell carcinoma of skin of nose: Secondary | ICD-10-CM

## 2021-07-07 NOTE — Telephone Encounter (Signed)
Discussed biopsy results with pt, pt would like to go for Mohs surgery- she prefer Dr Lorn Junes at [facility]

## 2021-07-08 ENCOUNTER — Telehealth: Payer: Self-pay

## 2021-07-23 ENCOUNTER — Ambulatory Visit: Payer: Medicare PPO | Admitting: Neurology

## 2021-07-23 DIAGNOSIS — G43709 Chronic migraine without aura, not intractable, without status migrainosus: Secondary | ICD-10-CM

## 2021-07-23 MED ORDER — NURTEC 75 MG PO TBDP: 75.0000 mg | ORAL_TABLET | Freq: Every day | ORAL | 0 refills | Status: DC | PRN

## 2021-07-23 NOTE — Progress Notes (Signed)
Botox consent signed  Botox- 200 units x 1 vial Lot: [id number] Expiration: 02/2024 NDC: 2787-1836-72  Bacteriostatic 0.9% Sodium Chloride- 52m total Lot: [id number]: 08/13/2022 NDC: [address]-58 Dx: [id number]/B

## 2021-07-23 NOTE — Patient Instructions (Addendum)
Nurtec once daily as needed. May take with suatriptan/rizatriptan  Rimegepant Disintegrating Tablets What is this medication? RIMEGEPANT (ri ME je pant) prevents and treats migraines. It works by blocking a substance in the body that causes migraines. This medicine may be used for other purposes; ask your health care provider or pharmacist if you have questions. COMMON BRAND NAME(S): NURTEC ODT What should I tell my care team before I take this medication? They need to know if you have any of these conditions: Kidney disease Liver disease An unusual or allergic reaction to rimegepant, other medications, foods, dyes, or preservatives Pregnant or trying to get pregnant Breast-feeding How should I use this medication? Take this medication by mouth. Take it as directed on the prescription label. Leave the tablet in the sealed pack until you are ready to take it. With dry hands, open the pack and gently remove the tablet. If the tablet breaks or crumbles, throw it away. Use a new tablet. Place the tablet in the mouth and allow it to dissolve. Then, swallow it. Do not cut, crush, or chew this medication. You do not need water to take this medication. Talk to your care team about the use of this medication in children. Special care may be needed. Overdosage: If you think you have taken too much of this medicine contact a poison control center or emergency room at once. NOTE: This medicine is only for you. Do not share this medicine with others. What if I miss a dose? This does not apply. This medication is not for regular use. What may interact with this medication? Certain medications for fungal infections, such as fluconazole, itraconazole Rifampin This list may not describe all possible interactions. Give your health care provider a list of all the medicines, herbs, non-prescription drugs, or dietary supplements you use. Also tell them if you smoke, drink alcohol, or use illegal drugs. Some items  may interact with your medicine. What should I watch for while using this medication? Visit your care team for regular checks on your progress. Tell your care team if your symptoms do not start to get better or if they get worse. What side effects may I notice from receiving this medication? Side effects that you should report to your care team as soon as possible: Allergic reactions--skin rash, itching, hives, swelling of the face, lips, tongue, or throat Side effects that usually do not require medical attention (report to your care team if they continue or are bothersome): Nausea Stomach pain This list may not describe all possible side effects. Call your doctor for medical advice about side effects. You may report side effects to FDA at 1-800-FDA-1088. Where should I keep my medication? Keep out of the reach of children and pets. Store at room temperature between 20 and 25 degrees C (68 and 77 degrees F). Get rid of any unused medication after the expiration date. To get rid of medications that are no longer needed or have expired: Take the medication to a medication take-back program. Check with your pharmacy or law enforcement to find a location. If you cannot return the medication, check the label or package insert to see if the medication should be thrown out in the garbage or flushed down the toilet. If you are not sure, ask your care team. If it is safe to put it in the trash, take the medication out of the container. Mix the medication with cat litter, dirt, coffee grounds, or other unwanted substance. Seal the mixture in a  bag or container. Put it in the trash. NOTE: This sheet is a summary. It may not cover all possible information. If you have questions about this medicine, talk to your doctor, pharmacist, or health care provider.  2023 Elsevier/Gold Standard (2021-02-19 00:00:00)

## 2021-07-28 DIAGNOSIS — C44311 Basal cell carcinoma of skin of nose: Secondary | ICD-10-CM | POA: Diagnosis not present

## 2021-07-28 DIAGNOSIS — L814 Other melanin hyperpigmentation: Secondary | ICD-10-CM | POA: Diagnosis not present

## 2021-07-28 DIAGNOSIS — L988 Other specified disorders of the skin and subcutaneous tissue: Secondary | ICD-10-CM | POA: Diagnosis not present

## 2021-07-28 DIAGNOSIS — L578 Other skin changes due to chronic exposure to nonionizing radiation: Secondary | ICD-10-CM | POA: Diagnosis not present

## 2021-07-28 NOTE — Progress Notes (Signed)
Consent Form Botulism Toxin Injection For Chronic Migraine    07/30/2021: stable 05/05/2021: stable 01/28/2021: hasn't had one migraine or headache tremendous improvement.   10/29/2020:  Still excellent response >> 75% improvement freq and severity. She has two 74 year old twin grandchildren, a boy and a girl, kindergarten.   Reviewed orally with patient, additionally signature is on file:  Botulism toxin has been approved by the Federal drug administration for treatment of chronic migraine. Botulism toxin does not cure chronic migraine and it may not be effective in some patients.  The administration of botulism toxin is accomplished by injecting a small amount of toxin into the muscles of the neck and head. Dosage must be titrated for each individual. Any benefits resulting from botulism toxin tend to wear off after 3 months with a repeat injection required if benefit is to be maintained. Injections are usually done every 3-4 months with maximum effect peak achieved by about 2 or 3 weeks. Botulism toxin is expensive and you should be sure of what costs you will incur resulting from the injection.  The side effects of botulism toxin use for chronic migraine may include:   -Transient, and usually mild, facial weakness with facial injections  -Transient, and usually mild, head or neck weakness with head/neck injections  -Reduction or loss of forehead facial animation due to forehead muscle weakness  -Eyelid drooping  -Dry eye  -Pain at the site of injection or bruising at the site of injection  -Double vision  -Potential unknown long term risks  Contraindications: You should not have Botox if you are pregnant, nursing, allergic to albumin, have an infection, skin condition, or muscle weakness at the site of the injection, or have myasthenia gravis, Lambert-Eaton syndrome, or ALS.  It is also possible that as with any injection, there may be an allergic reaction or no effect from the  medication. Reduced effectiveness after repeated injections is sometimes seen and rarely infection at the injection site may occur. All care will be taken to prevent these side effects. If therapy is given over a long time, atrophy and wasting in the muscle injected may occur. Occasionally the patient's become refractory to treatment because they develop antibodies to the toxin. In this event, therapy needs to be modified.  I have read the above information and consent to the administration of botulism toxin.    BOTOX PROCEDURE NOTE FOR MIGRAINE HEADACHE    Contraindications and precautions discussed with patient(above). Aseptic procedure was observed and patient tolerated procedure. Procedure performed by Dr. Georgia Dom  The condition has existed for more than 6 months, and pt does not have a diagnosis of ALS, Myasthenia Gravis or Lambert-Eaton Syndrome.  Risks and benefits of injections discussed and pt agrees to proceed with the procedure.  Written consent obtained  These injections are medically necessary. Pt  receives good benefits from these injections. These injections do not cause sedations or hallucinations which the oral therapies may cause.  Description of procedure:  The patient was placed in a sitting position. The standard protocol was used for Botox as follows, with 5 units of Botox injected at each site:   -Procerus muscle, midline injection  -Corrugator muscle, bilateral injection  -Frontalis muscle, bilateral injection, with 2 sites each side, medial injection was performed in the upper one third of the frontalis muscle, in the region vertical from the medial inferior edge of the superior orbital rim. The lateral injection was again in the upper one third of the forehead vertically  above the lateral limbus of the cornea, 1.5 cm lateral to the medial injection site.  -Temporalis muscle injection, 4 sites, bilaterally. The first injection was 3 cm above the tragus of the  ear, second injection site was 1.5 cm to 3 cm up from the first injection site in line with the tragus of the ear. The third injection site was 1.5-3 cm forward between the first 2 injection sites. The fourth injection site was 1.5 cm posterior to the second injection site.   -Occipitalis muscle injection, 3 sites, bilaterally. The first injection was done one half way between the occipital protuberance and the tip of the mastoid process behind the ear. The second injection site was done lateral and superior to the first, 1 fingerbreadth from the first injection. The third injection site was 1 fingerbreadth superiorly and medially from the first injection site.  -Cervical paraspinal muscle injection, 2 sites, bilateral knee first injection site was 1 cm from the midline of the cervical spine, 3 cm inferior to the lower border of the occipital protuberance. The second injection site was 1.5 cm superiorly and laterally to the first injection site.  -Trapezius muscle injection was performed at 3 sites, bilaterally. The first injection site was in the upper trapezius muscle halfway between the inflection point of the neck, and the acromion. The second injection site was one half way between the acromion and the first injection site. The third injection was done between the first injection site and the inflection point of the neck.   Will return for repeat injection in 3 months.   155 units of Botox was used, 45u Botox not injected was wasted. The patient tolerated the procedure well, there were no complications of the above procedure.

## 2021-08-05 ENCOUNTER — Telehealth: Payer: Self-pay

## 2021-08-05 NOTE — Telephone Encounter (Signed)
History and Specimen tracking updated per [facility] surgery note.

## 2021-08-06 MED ORDER — ONABOTULINUMTOXINA 200 UNITS IJ SOLR: 155.0000 [IU] | Freq: Once | INTRAMUSCULAR | Status: AC

## 2021-08-06 NOTE — Addendum Note (Signed)
Addended by: Gildardo Griffes on: 08/06/2021 02:29 PM   Modules accepted: Orders

## 2021-10-15 ENCOUNTER — Telehealth: Payer: Self-pay | Admitting: *Deleted

## 2021-10-15 NOTE — Telephone Encounter (Signed)
Botox Approved  Aprroval Dates 01/12/2021-01/11/2022  Authrozation # [id number] [employer]

## 2021-10-16 ENCOUNTER — Encounter: Payer: Self-pay | Admitting: Neurology

## 2021-10-16 ENCOUNTER — Ambulatory Visit: Payer: Medicare PPO | Admitting: Neurology

## 2021-10-16 DIAGNOSIS — G43709 Chronic migraine without aura, not intractable, without status migrainosus: Secondary | ICD-10-CM

## 2021-10-16 DIAGNOSIS — H903 Sensorineural hearing loss, bilateral: Secondary | ICD-10-CM | POA: Diagnosis not present

## 2021-10-16 DIAGNOSIS — H6982 Other specified disorders of Eustachian tube, left ear: Secondary | ICD-10-CM | POA: Diagnosis not present

## 2021-10-16 DIAGNOSIS — H9312 Tinnitus, left ear: Secondary | ICD-10-CM | POA: Diagnosis not present

## 2021-10-16 MED ORDER — ONABOTULINUMTOXINA 200 UNITS IJ SOLR
155.0000 [IU] | Freq: Once | INTRAMUSCULAR | Status: AC
  Administered 2021-10-16: 155 [IU] via INTRAMUSCULAR

## 2021-10-16 NOTE — Progress Notes (Signed)
Botox- 200 units x 1 vial Lot: [id number] Expiration: 02/2024 NDC: 6767-2094-70  Bacteriostatic 0.9% Sodium Chloride- 24m total Lot: [id number] Expiration: 08/13/2022 NDC: [address]-94 Dx:[id number]  B/B

## 2021-10-16 NOTE — Progress Notes (Signed)
Botox- 200 units x 1 vial

## 2021-10-16 NOTE — Progress Notes (Signed)
Consent Form Botulism Toxin Injection For Chronic Migraine  01/19/2022; Stable, Still excellent response >> 75% improvement freq and severity. 10/19/2021: doing well still, stable 07/30/2021: stable 05/05/2021: stable 01/28/2021: hasn't had one migraine or headache tremendous improvement.   10/29/2020:  Still excellent response >> 75% improvement freq and severity. She has two 74 year old twin grandchildren, a boy and a girl, kindergarten.   Reviewed orally with patient, additionally signature is on file:  Botulism toxin has been approved by the Federal drug administration for treatment of chronic migraine. Botulism toxin does not cure chronic migraine and it may not be effective in some patients.  The administration of botulism toxin is accomplished by injecting a small amount of toxin into the muscles of the neck and head. Dosage must be titrated for each individual. Any benefits resulting from botulism toxin tend to wear off after 3 months with a repeat injection required if benefit is to be maintained. Injections are usually done every 3-4 months with maximum effect peak achieved by about 2 or 3 weeks. Botulism toxin is expensive and you should be sure of what costs you will incur resulting from the injection.  The side effects of botulism toxin use for chronic migraine may include:   -Transient, and usually mild, facial weakness with facial injections  -Transient, and usually mild, head or neck weakness with head/neck injections  -Reduction or loss of forehead facial animation due to forehead muscle weakness  -Eyelid drooping  -Dry eye  -Pain at the site of injection or bruising at the site of injection  -Double vision  -Potential unknown long term risks  Contraindications: You should not have Botox if you are pregnant, nursing, allergic to albumin, have an infection, skin condition, or muscle weakness at the site of the injection, or have myasthenia gravis, Lambert-Eaton syndrome, or  ALS.  It is also possible that as with any injection, there may be an allergic reaction or no effect from the medication. Reduced effectiveness after repeated injections is sometimes seen and rarely infection at the injection site may occur. All care will be taken to prevent these side effects. If therapy is given over a long time, atrophy and wasting in the muscle injected may occur. Occasionally the patient's become refractory to treatment because they develop antibodies to the toxin. In this event, therapy needs to be modified.  I have read the above information and consent to the administration of botulism toxin.    BOTOX PROCEDURE NOTE FOR MIGRAINE HEADACHE    Contraindications and precautions discussed with patient(above). Aseptic procedure was observed and patient tolerated procedure. Procedure performed by Dr. Toni Nandan Willems  The condition has existed for more than 6 months, and pt does not have a diagnosis of ALS, Myasthenia Gravis or Lambert-Eaton Syndrome.  Risks and benefits of injections discussed and pt agrees to proceed with the procedure.  Written consent obtained  These injections are medically necessary. Pt  receives good benefits from these injections. These injections do not cause sedations or hallucinations which the oral therapies may cause.  Description of procedure:  The patient was placed in a sitting position. The standard protocol was used for Botox as follows, with 5 units of Botox injected at each site:   -Procerus muscle, midline injection  -Corrugator muscle, bilateral injection  -Frontalis muscle, bilateral injection, with 2 sites each side, medial injection was performed in the upper one third of the frontalis muscle, in the region vertical from the medial inferior edge of the superior orbital rim.   The lateral injection was again in the upper one third of the forehead vertically above the lateral limbus of the cornea, 1.5 cm lateral to the medial injection  site.  -Temporalis muscle injection, 4 sites, bilaterally. The first injection was 3 cm above the tragus of the ear, second injection site was 1.5 cm to 3 cm up from the first injection site in line with the tragus of the ear. The third injection site was 1.5-3 cm forward between the first 2 injection sites. The fourth injection site was 1.5 cm posterior to the second injection site.   -Occipitalis muscle injection, 3 sites, bilaterally. The first injection was done one half way between the occipital protuberance and the tip of the mastoid process behind the ear. The second injection site was done lateral and superior to the first, 1 fingerbreadth from the first injection. The third injection site was 1 fingerbreadth superiorly and medially from the first injection site.  -Cervical paraspinal muscle injection, 2 sites, bilateral knee first injection site was 1 cm from the midline of the cervical spine, 3 cm inferior to the lower border of the occipital protuberance. The second injection site was 1.5 cm superiorly and laterally to the first injection site.  -Trapezius muscle injection was performed at 3 sites, bilaterally. The first injection site was in the upper trapezius muscle halfway between the inflection point of the neck, and the acromion. The second injection site was one half way between the acromion and the first injection site. The third injection was done between the first injection site and the inflection point of the neck.   Will return for repeat injection in 3 months.   155 units of Botox was used, 45u Botox not injected was wasted. The patient tolerated the procedure well, there were no complications of the above procedure.    

## 2021-11-17 DIAGNOSIS — H903 Sensorineural hearing loss, bilateral: Secondary | ICD-10-CM | POA: Diagnosis not present

## 2021-11-17 DIAGNOSIS — H6982 Other specified disorders of Eustachian tube, left ear: Secondary | ICD-10-CM | POA: Diagnosis not present

## 2021-12-08 ENCOUNTER — Ambulatory Visit: Payer: Medicare PPO | Admitting: Family Medicine

## 2021-12-08 ENCOUNTER — Encounter: Payer: Self-pay | Admitting: Family Medicine

## 2021-12-08 VITALS — BP 100/70 | HR 77 | Temp 97.9°F | Ht 61.25 in | Wt 129.0 lb

## 2021-12-08 DIAGNOSIS — J069 Acute upper respiratory infection, unspecified: Secondary | ICD-10-CM

## 2021-12-08 MED ORDER — AMOXICILLIN 875 MG PO TABS: 875.0000 mg | ORAL_TABLET | Freq: Two times a day (BID) | ORAL | 0 refills | Status: DC

## 2021-12-08 NOTE — Progress Notes (Signed)
Jacqueline Dizon T. Byrd Terrero, MD, [facility] [place], [address]  Phone: [phone]  FAX: [phone]  Jacqueline Meza - 76 y.o. female  MRN [id number]  Date of Birth: 1945/12/23  Date: 12/08/2021  PCP: Venia Carbon, MD  Referral: Venia Carbon, MD  Chief Complaint  Patient presents with   Cough    Symptoms started on Friday Negative Home Covid Test this morning   Sore Throat   Nasal Congestion   Subjective:   This 76 y.o. female patient presents with runny nose, sneezing, cough, sore throat, malaise and minimal / low-grade fever .   + recent exposure to others with similar symptoms.  Visited her grandchildren all last week.  Friday evening started to get sick.  A lot of sinus pressure, nasal congestion.   No fever No body aches or chills.   The patent denies sore throat as the primary complaint. Denies sthortness of breath/wheezing, high fever, chest pain, rhinits for more than 14 days, significant myalgia, otalgia, facial pain, abdominal pain, changes in bowel or bladder.   Review of Systems is noted in the HPI, as appropriate  Objective:   Blood pressure 100/70, pulse 77, temperature 97.9 F (36.6 C), temperature source Oral, height 5' 1.25" (1.556 m), weight 129 lb (58.5 kg), SpO2 98 %.   Gen: WDWN, NAD. Globally Non-toxic HEENT: Throat clear, w/o exudate, R TM clear, L TM - good landmarks, No fluid present. rhinnorhea.  MMM Frontal sinuses: NT Max sinuses: NT NECK: Anterior cervical  LAD is absent CV: RRR, No M/G/R, cap refill <2 sec PULM: Breathing comfortably in no respiratory distress. no wheezing, crackles, rhonchi   Objective Data:   Assessment and Plan:     ICD-10-CM   1. Viral URI  J06.9      I think this is probably all viral and she can continue some conservative care.  I did send her in some antibiotics that she can hold, she is not going to fill those unless she  is worsens by the end of the week.  Supportive care reviewed with patient. See patient instruction section.  Follow-up: No follow-ups on file.  Meds ordered this encounter  Medications   amoxicillin (AMOXIL) 875 MG tablet    Sig: Take 1 tablet (875 mg total) by mouth 2 (two) times daily.    Dispense:  20 tablet    Refill:  0   No orders of the defined types were placed in this encounter.   Signed,  Maud Deed. Eisha Chatterjee, MD   Patient's Medications  New Prescriptions   AMOXICILLIN (AMOXIL) 875 MG TABLET    Take 1 tablet (875 mg total) by mouth 2 (two) times daily.  Previous Medications   BIOTIN 5000 PO    Take 1 capsule by mouth daily.   CYANOCOBALAMIN (VITAMIN B12) 1000 MCG TABLET    Take 1,000 mcg by mouth daily.   GALCANEZUMAB-GNLM (EMGALITY) 120 MG/ML SOAJ    Inject 1 mL into the skin every 30 (thirty) days.   MAGNESIUM CITRATE 200 MG TABS    Take 1 tablet by mouth daily.   MOMETASONE (ELOCON) 0.1 % CREAM    Apply 1-2 times daily as needed for rash at hand up to 5 days a week. Avoid applying to face, groin, and axilla. Use as directed. Long-term use can cause thinning of the skin.   MULTIPLE VITAMIN (MULTIVITAMIN) TABLET    Take 1 tablet by mouth  daily.   RIMEGEPANT SULFATE (NURTEC) 75 MG TBDP    Take 75 mg by mouth daily as needed. For migraines. Take as close to onset of migraine as possible. One daily maximum.   SUMATRIPTAN (IMITREX) 100 MG TABLET    TAKE ONE TABLET BY MOUTH AS NEEDED   TOPIRAMATE (TOPAMAX) 100 MG TABLET    TAKE 1 TABLET(100 MG) BY MOUTH TWICE DAILY   TURMERIC (QC TUMERIC COMPLEX) 500 MG CAPS    Take 1 capsule by mouth daily at 6 (six) AM.  Modified Medications   No medications on file  Discontinued Medications   No medications on file

## 2021-12-29 ENCOUNTER — Telehealth: Payer: Self-pay | Admitting: Neurology

## 2021-12-29 NOTE — Telephone Encounter (Signed)
Pt is scheduled for botox injection for 01/19/22 and will need a new PA before appointment. Current PA expires 01/11/22

## 2021-12-29 NOTE — Telephone Encounter (Signed)
Chronic Migraine CPT 64615  Botox J0585 Units:200  G43.709 Chronic Migraine without aura, not intractable, without status migrainous  I did attempt to call pt (LVM) to see her insurance will change in 2024. Also sent mychart message.

## 2022-01-08 ENCOUNTER — Ambulatory Visit: Payer: Medicare PPO | Admitting: Neurology

## 2022-01-14 ENCOUNTER — Telehealth: Payer: Self-pay

## 2022-01-14 ENCOUNTER — Other Ambulatory Visit (HOSPITAL_COMMUNITY): Payer: Self-pay

## 2022-01-14 NOTE — Telephone Encounter (Signed)
Not seeing new auth in here yet- pt is scheduled to come in 1/8.

## 2022-01-14 NOTE — Telephone Encounter (Signed)
Pharmacy Patient Advocate Encounter   Received notification from [facility] that prior authorization for Botox 200UNIT solution is required/requested.    PA submitted on 01/14/2022 to (ins) [employer] via CoverMyMeds Key [id number] Status is pending

## 2022-01-14 NOTE — Telephone Encounter (Signed)
Per Rx prior auth team, PA submitted on 01/14/2022 to (ins) [employer] via CoverMyMeds. Key [id number]. Determination pending

## 2022-01-16 ENCOUNTER — Other Ambulatory Visit (HOSPITAL_COMMUNITY): Payer: Self-pay

## 2022-01-19 ENCOUNTER — Encounter: Payer: Self-pay | Admitting: Neurology

## 2022-01-19 ENCOUNTER — Ambulatory Visit: Payer: Medicare PPO | Admitting: Neurology

## 2022-01-19 DIAGNOSIS — G43709 Chronic migraine without aura, not intractable, without status migrainosus: Secondary | ICD-10-CM | POA: Diagnosis not present

## 2022-01-19 MED ORDER — ONABOTULINUMTOXINA 200 UNITS IJ SOLR
155.0000 [IU] | Freq: Once | INTRAMUSCULAR | Status: AC
  Administered 2022-01-19: 155 [IU] via INTRAMUSCULAR

## 2022-01-19 NOTE — Progress Notes (Unsigned)
Botox- 200 units x 1 vial Lot: [id number] Expiration: 05/2024 NDC: 8335-8251-89  Bacteriostatic 0.9% Sodium Chloride- 100m total Lot: [id number]: 09/13/2022 NDC: [address]-77 Dx: [id number]/B

## 2022-01-19 NOTE — Telephone Encounter (Signed)
Spoke with Tamra in pharmacy. Looks like pt remains B/B. I called pt. She will come as scheduled today at 1030.

## 2022-01-19 NOTE — Progress Notes (Unsigned)
Consent Form Botulism Toxin Injection For Chronic Migraine  01/19/2022; Stable, Still excellent response >> 75% improvement freq and severity. 10/19/2021: doing well still, stable 07/30/2021: stable 05/05/2021: stable 01/28/2021: hasn't had one migraine or headache tremendous improvement.   10/29/2020:  Still excellent response >> 75% improvement freq and severity. She has two 75 year old twin grandchildren, a boy and a girl, kindergarten.   Reviewed orally with patient, additionally signature is on file:  Botulism toxin has been approved by the Federal drug administration for treatment of chronic migraine. Botulism toxin does not cure chronic migraine and it may not be effective in some patients.  The administration of botulism toxin is accomplished by injecting a small amount of toxin into the muscles of the neck and head. Dosage must be titrated for each individual. Any benefits resulting from botulism toxin tend to wear off after 3 months with a repeat injection required if benefit is to be maintained. Injections are usually done every 3-4 months with maximum effect peak achieved by about 2 or 3 weeks. Botulism toxin is expensive and you should be sure of what costs you will incur resulting from the injection.  The side effects of botulism toxin use for chronic migraine may include:   -Transient, and usually mild, facial weakness with facial injections  -Transient, and usually mild, head or neck weakness with head/neck injections  -Reduction or loss of forehead facial animation due to forehead muscle weakness  -Eyelid drooping  -Dry eye  -Pain at the site of injection or bruising at the site of injection  -Double vision  -Potential unknown long term risks  Contraindications: You should not have Botox if you are pregnant, nursing, allergic to albumin, have an infection, skin condition, or muscle weakness at the site of the injection, or have myasthenia gravis, Lambert-Eaton syndrome, or  ALS.  It is also possible that as with any injection, there may be an allergic reaction or no effect from the medication. Reduced effectiveness after repeated injections is sometimes seen and rarely infection at the injection site may occur. All care will be taken to prevent these side effects. If therapy is given over a long time, atrophy and wasting in the muscle injected may occur. Occasionally the patient's become refractory to treatment because they develop antibodies to the toxin. In this event, therapy needs to be modified.  I have read the above information and consent to the administration of botulism toxin.    BOTOX PROCEDURE NOTE FOR MIGRAINE HEADACHE    Contraindications and precautions discussed with patient(above). Aseptic procedure was observed and patient tolerated procedure. Procedure performed by Dr. Artemio Aly  The condition has existed for more than 6 months, and pt does not have a diagnosis of ALS, Myasthenia Gravis or Lambert-Eaton Syndrome.  Risks and benefits of injections discussed and pt agrees to proceed with the procedure.  Written consent obtained  These injections are medically necessary. Pt  receives good benefits from these injections. These injections do not cause sedations or hallucinations which the oral therapies may cause.  Description of procedure:  The patient was placed in a sitting position. The standard protocol was used for Botox as follows, with 5 units of Botox injected at each site:   -Procerus muscle, midline injection  -Corrugator muscle, bilateral injection  -Frontalis muscle, bilateral injection, with 2 sites each side, medial injection was performed in the upper one third of the frontalis muscle, in the region vertical from the medial inferior edge of the superior orbital rim.  The lateral injection was again in the upper one third of the forehead vertically above the lateral limbus of the cornea, 1.5 cm lateral to the medial injection  site.  -Temporalis muscle injection, 4 sites, bilaterally. The first injection was 3 cm above the tragus of the ear, second injection site was 1.5 cm to 3 cm up from the first injection site in line with the tragus of the ear. The third injection site was 1.5-3 cm forward between the first 2 injection sites. The fourth injection site was 1.5 cm posterior to the second injection site.   -Occipitalis muscle injection, 3 sites, bilaterally. The first injection was done one half way between the occipital protuberance and the tip of the mastoid process behind the ear. The second injection site was done lateral and superior to the first, 1 fingerbreadth from the first injection. The third injection site was 1 fingerbreadth superiorly and medially from the first injection site.  -Cervical paraspinal muscle injection, 2 sites, bilateral knee first injection site was 1 cm from the midline of the cervical spine, 3 cm inferior to the lower border of the occipital protuberance. The second injection site was 1.5 cm superiorly and laterally to the first injection site.  -Trapezius muscle injection was performed at 3 sites, bilaterally. The first injection site was in the upper trapezius muscle halfway between the inflection point of the neck, and the acromion. The second injection site was one half way between the acromion and the first injection site. The third injection was done between the first injection site and the inflection point of the neck.   Will return for repeat injection in 3 months.   155 units of Botox was used, 45u Botox not injected was wasted. The patient tolerated the procedure well, there were no complications of the above procedure.

## 2022-01-19 NOTE — Telephone Encounter (Signed)
Jacqueline Meza (Key: [id number]) PA Case ID #: [id number]  Outcome Approved on January 3 PA Case: [id number], Status: Approved, Coverage Starts on: 01/13/2020 12:00:00 AM, Coverage Ends on: 01/12/2023 12:00:00 AM. Questions? Contact [phone]. Authorization Expiration Date: 01/11/2023

## 2022-01-26 ENCOUNTER — Other Ambulatory Visit: Payer: Self-pay | Admitting: Internal Medicine

## 2022-01-26 DIAGNOSIS — Z1231 Encounter for screening mammogram for malignant neoplasm of breast: Secondary | ICD-10-CM

## 2022-01-28 ENCOUNTER — Encounter: Payer: Self-pay | Admitting: Internal Medicine

## 2022-01-28 ENCOUNTER — Ambulatory Visit (INDEPENDENT_AMBULATORY_CARE_PROVIDER_SITE_OTHER): Payer: Medicare PPO | Admitting: Internal Medicine

## 2022-01-28 VITALS — BP 110/80 | HR 72 | Temp 97.9°F | Ht 61.5 in | Wt 129.0 lb

## 2022-01-28 DIAGNOSIS — Z0000 Encounter for general adult medical examination without abnormal findings: Secondary | ICD-10-CM | POA: Diagnosis not present

## 2022-01-28 DIAGNOSIS — G43709 Chronic migraine without aura, not intractable, without status migrainosus: Secondary | ICD-10-CM | POA: Diagnosis not present

## 2022-01-28 DIAGNOSIS — G479 Sleep disorder, unspecified: Secondary | ICD-10-CM

## 2022-01-28 DIAGNOSIS — E785 Hyperlipidemia, unspecified: Secondary | ICD-10-CM | POA: Diagnosis not present

## 2022-01-28 LAB — COMPREHENSIVE METABOLIC PANEL
ALT: 15 U/L (ref 0–35)
AST: 22 U/L (ref 0–37)
Albumin: 4.3 g/dL (ref 3.5–5.2)
Alkaline Phosphatase: 60 U/L (ref 39–117)
BUN: 16 mg/dL (ref 6–23)
CO2: 26 mEq/L (ref 19–32)
Calcium: 9.1 mg/dL (ref 8.4–10.5)
Chloride: 106 mEq/L (ref 96–112)
Creatinine, Ser: 0.71 mg/dL (ref 0.40–1.20)
GFR: 83.42 mL/min (ref >60.00)
Glucose, Bld: 92 mg/dL (ref 70–99)
Potassium: 4 mEq/L (ref 3.5–5.1)
Sodium: 140 mEq/L (ref 135–145)
Total Bilirubin: 0.7 mg/dL (ref 0.2–1.2)
Total Protein: 7 g/dL (ref 6.0–8.3)

## 2022-01-28 LAB — CBC
HCT: 41.8 % (ref 36.0–46.0)
Hemoglobin: 14.2 g/dL (ref 12.0–15.0)
MCHC: 33.9 g/dL (ref 30.0–36.0)
MCV: 109.9 fl — ABNORMAL HIGH (ref 78.0–100.0)
Platelets: 201 10*3/uL (ref 150.0–400.0)
RBC: 3.8 Mil/uL — ABNORMAL LOW (ref 3.87–5.11)
RDW: 13 % (ref 11.5–15.5)
WBC: 4.8 10*3/uL (ref 4.0–10.5)

## 2022-01-28 LAB — LIPID PANEL
Cholesterol: 247 mg/dL — ABNORMAL HIGH (ref 0–200)
HDL: 66.3 mg/dL (ref >39.00)
NonHDL: 181.15
Total CHOL/HDL Ratio: 4
Triglycerides: 205 mg/dL — ABNORMAL HIGH (ref 0.0–149.0)
VLDL: 41 mg/dL — ABNORMAL HIGH (ref 0.0–40.0)

## 2022-01-28 LAB — LDL CHOLESTEROL, DIRECT: Direct LDL: 154 mg/dL

## 2022-01-28 NOTE — Progress Notes (Signed)
Hearing Screening - Comments:: October 2023. Has hearing aids. Not wearing them today. Vision Screening - Comments:: February 2023

## 2022-01-28 NOTE — Assessment & Plan Note (Signed)
Shoulders bother her some--tylenol Can try diclofenac gel also  Discussed sleep constriction also

## 2022-01-28 NOTE — Progress Notes (Signed)
Subjective:    Patient ID: Jacqueline Meza, female    DOB: 1945/05/14, 77 y.o.   MRN: [id number]  HPI Here for Medicare wellness visit and follow up of chronic health conditions Reviewed advanced directives--has DNR Reviewed other doctors--Dr Ahern--neurology/HA, Dr Maryellen Pile, [facility], Dr Dyane Dustman No hospitalizations. 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 Tries to go to bed 9-9:30.  Discussed sleep restriction  Has chronic shoulder pain--affects her sleep Relates to cleaning houses Does take tylenol Stopped with the chiropractor  Headaches are much better Not needing the imitrex as much Still getting botox and emgality monthly  Current Outpatient Medications on File Prior to Visit  Medication Sig Dispense Refill   Cranberry-Milk Thistle (LIVER & KIDNEY CLEANSER PO) Take by mouth.     cyanocobalamin (VITAMIN B12) 1000 MCG tablet Take 1,000 mcg by mouth daily.     Emollient (COLLAGEN EX) Apply topically.     Galcanezumab-gnlm (EMGALITY) 120 MG/ML SOAJ Inject 1 mL into the skin every 30 (thirty) days. 1 mL 11   Magnesium Citrate 200 MG TABS Take 1 tablet by mouth daily.     mometasone (ELOCON) 0.1 % cream Apply 1-2 times daily as needed for rash at hand up to 5 days a week. Avoid applying to face, groin, and axilla. Use as directed. Long-term use can cause thinning of the skin. 45 g 1   SUMAtriptan (IMITREX) 100 MG tablet TAKE ONE TABLET BY MOUTH AS NEEDED 10 tablet 11   topiramate (TOPAMAX) 100 MG tablet TAKE 1 TABLET(100 MG) BY MOUTH TWICE DAILY (Patient  taking differently: Take 100 mg by mouth daily.) 180 tablet 3   Turmeric (QC TUMERIC COMPLEX) 500 MG CAPS Take 1 capsule by mouth daily at 6 (six) AM.     Current Facility-Administered Medications on File Prior to Visit  Medication Dose Route Frequency Provider Last Rate Last Admin   botulinum toxin Type A (BOTOX) injection 155 Units  155 Units Intramuscular Once Melvenia Beam, MD        Allergies  Allergen Reactions   Hydrocodone Other (See Comments)   Codeine Sulfate     REACTION: headache and nausea    Past Medical History:  Diagnosis Date   Actinic keratosis 12/12/2007   R leg - bx proven    Basal cell carcinoma 01/04/2007   L upper back - superficial    Basal cell carcinoma 11/08/2013   L ant scalp    Basal cell carcinoma 12/10/2014   L nasal tip    Basal cell carcinoma 06/30/2021   left nasal tip, schedule mohs   Dysplastic nevus 01/23/2008   R ant ankle - moderate   Dysplastic nevus 11/08/2012   R prox dorsum of foot - mild    Endometriosis    Hyperlipidemia    Migraines    since childhood   Raynaud's phenomenon     Past Surgical History:  Procedure Laterality Date   ABDOMINAL HYSTERECTOMY  01/12/1993   endometriosis and worsening migraines   CESAREAN SECTION  1988   COLONOSCOPY  2005   Dr Bary Castilla   DILATION AND CURETTAGE OF UTERUS  done after miscarriages twice   MOHS SURGERY     Left Side of Nose    Family History  Problem Relation Age of Onset   Kidney failure Mother    Cancer Father        lymphoma   Bone cancer Father    Migraines Father    Migraines Sister    Migraines Daughter    Cancer Paternal Aunt        breast   Breast cancer Paternal Aunt        late 87's   Migraines Paternal Aunt    Cancer Paternal Uncle 72       colon   Breast cancer Other    CAD Neg Hx    Stroke Neg Hx    Diabetes Neg Hx     Social History   Socioeconomic History   Marital status: Divorced    Spouse name: Not on file   Number of children: 1    Years of education: Not on file   Highest education level: Not on file  Occupational History   Occupation: Retired--curriculum facilitator    Comment: [employer]   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   Requests DNR after discussion --form done 04/22/12   No tube feedings if cognitively unaware         Update 09/26/2019   Lives alone    Right handed   Caffeine: 1 cup/day   Social Determinants of Health   Financial Resource Strain: Not on file  Food Insecurity: Not on file  Transportation Needs: Not on file  Physical Activity: Not on file  Stress: Not on file  Social Connections: Not on file  Intimate Partner Violence: Not on file   Review of Systems Appetite is okay Weight stable Wears seat belt Teeth okay---regular with dentist No heartburn but occasional trouble swallowing red meat or eats quickly Bowels are fine--no blood No urinary problems--did have bladder infection No chest pain or SOB No sig joint problems other than shoulders No dizziness or syncope No edema     Objective:   Physical Exam Constitutional:      Appearance: Normal appearance.  HENT:     Mouth/Throat:     Comments: No lesions Eyes:     Conjunctiva/sclera: Conjunctivae normal.     Pupils: Pupils are equal, round, and reactive to light.  Cardiovascular:     Rate and Rhythm: Normal rate and  regular rhythm.     Pulses: Normal pulses.     Heart sounds: No murmur heard.    No gallop.  Pulmonary:     Effort: Pulmonary effort is normal.     Breath sounds: Normal breath sounds. No wheezing or rales.  Abdominal:     Palpations: Abdomen is soft.  Tenderness: There is no abdominal tenderness.  Musculoskeletal:     Cervical back: Neck supple.     Right lower leg: No edema.     Left lower leg: No edema.  Lymphadenopathy:     Cervical: No cervical adenopathy.  Skin:    Findings: No lesion or rash.  Neurological:     General: No focal deficit present.     Mental Status: She is alert and oriented to person, place, and time.     Comments: Word naming 10/1 minute Recall 3/3  Psychiatric:        Mood and Affect: Mood normal.        Behavior: Behavior normal.            Assessment & Plan:

## 2022-01-28 NOTE — Assessment & Plan Note (Signed)
I have personally reviewed the Medicare Annual Wellness questionnaire and have noted 1. The patient's medical and social history 2. Their use of alcohol, tobacco or illicit drugs 3. Their current medications and supplements 4. The patient's functional ability including ADL's, fall risks, home safety risks and hearing or visual             impairment. 5. Diet and physical activities 6. Evidence for depression or mood disorders  The patients weight, height, BMI and visual acuity have been recorded in the chart I have made referrals, counseling and provided education to the patient based review of the above and I have provided the pt with a written personalized care plan for preventive services.  I have provided you with a copy of your personalized plan for preventive services. Please take the time to review along with your updated medication list.  Colon (last screening) due next year Mammogram yearly --scheduled for this year No pap due to age Had flu vaccine---not excited about COVID vaccine Consider shingrix at pharmacy Getting back to her walking

## 2022-01-28 NOTE — Assessment & Plan Note (Signed)
Neither of [place] are excited about primary prevention Will recheck

## 2022-01-28 NOTE — Assessment & Plan Note (Signed)
Doing better with emgality

## 2022-01-30 ENCOUNTER — Other Ambulatory Visit: Payer: Self-pay | Admitting: Neurology

## 2022-01-30 DIAGNOSIS — G43709 Chronic migraine without aura, not intractable, without status migrainosus: Secondary | ICD-10-CM

## 2022-02-05 ENCOUNTER — Other Ambulatory Visit: Payer: Self-pay | Admitting: *Deleted

## 2022-02-05 DIAGNOSIS — G43709 Chronic migraine without aura, not intractable, without status migrainosus: Secondary | ICD-10-CM

## 2022-02-05 MED ORDER — EMGALITY 120 MG/ML ~~LOC~~ SOAJ: 120.0000 mg | SUBCUTANEOUS | 11 refills | Status: DC

## 2022-03-07 ENCOUNTER — Other Ambulatory Visit: Payer: Self-pay | Admitting: Internal Medicine

## 2022-03-18 ENCOUNTER — Ambulatory Visit
Admission: RE | Admit: 2022-03-18 | Discharge: 2022-03-18 | Disposition: A | Discharge status: Home / Self Care | Payer: Medicare PPO | Source: Ambulatory Visit | Attending: Internal Medicine | Admitting: Internal Medicine

## 2022-03-18 DIAGNOSIS — Z1231 Encounter for screening mammogram for malignant neoplasm of breast: Secondary | ICD-10-CM | POA: Diagnosis not present

## 2022-03-22 ENCOUNTER — Other Ambulatory Visit: Payer: Self-pay | Admitting: Internal Medicine

## 2022-03-30 ENCOUNTER — Ambulatory Visit: Payer: Medicare PPO | Admitting: Dermatology

## 2022-04-13 ENCOUNTER — Ambulatory Visit: Payer: Medicare PPO | Admitting: Neurology

## 2022-04-13 DIAGNOSIS — G43709 Chronic migraine without aura, not intractable, without status migrainosus: Secondary | ICD-10-CM | POA: Diagnosis not present

## 2022-04-13 MED ORDER — ONABOTULINUMTOXINA 200 UNITS IJ SOLR
155.0000 [IU] | Freq: Once | INTRAMUSCULAR | Status: AC
  Administered 2022-04-13: 155 [IU] via INTRAMUSCULAR

## 2022-04-13 NOTE — Progress Notes (Signed)
Botox- 200 units x 1 vial Lot: [id number] Expiration: 06/2024 NDC: [id number]  Bacteriostatic 0.9% Sodium Chloride- 4 mL total Lot: [id number] Expiration: 11/2023 NDC: [id number]  Dx: [id number] B/[id number] Witnessed by Hendricks Milo, RN

## 2022-04-13 NOTE — Progress Notes (Signed)
Consent Form Botulism Toxin Injection For Chronic Migraine  04/13/2022: Decreased topiramate to 100mg  qhs was bid. 2 migraines last 3 months, sumatriptan works. Bartolo working.  01/19/2022; Stable, Still excellent response >> 75% improvement freq and severity. 10/19/2021: doing well still, stable 07/30/2021: stable 05/05/2021: stable 01/28/2021: hasn't had one migraine or headache tremendous improvement.   10/29/2020:  Still excellent response >> 75% improvement freq and severity. She has two 3 year old twin grandchildren, a boy and a girl, kindergarten.   Reviewed orally with patient, additionally signature is on file:  Botulism toxin has been approved by the Federal drug administration for treatment of chronic migraine. Botulism toxin does not cure chronic migraine and it may not be effective in some patients.  The administration of botulism toxin is accomplished by injecting a small amount of toxin into the muscles of the neck and head. Dosage must be titrated for each individual. Any benefits resulting from botulism toxin tend to wear off after 3 months with a repeat injection required if benefit is to be maintained. Injections are usually done every 3-4 months with maximum effect peak achieved by about 2 or 3 weeks. Botulism toxin is expensive and you should be sure of what costs you will incur resulting from the injection.  The side effects of botulism toxin use for chronic migraine may include:   -Transient, and usually mild, facial weakness with facial injections  -Transient, and usually mild, head or neck weakness with head/neck injections  -Reduction or loss of forehead facial animation due to forehead muscle weakness  -Eyelid drooping  -Dry eye  -Pain at the site of injection or bruising at the site of injection  -Double vision  -Potential unknown long term risks  Contraindications: You should not have Botox if you are pregnant, nursing, allergic to albumin, have an infection,  skin condition, or muscle weakness at the site of the injection, or have myasthenia gravis, Lambert-Eaton syndrome, or ALS.  It is also possible that as with any injection, there may be an allergic reaction or no effect from the medication. Reduced effectiveness after repeated injections is sometimes seen and rarely infection at the injection site may occur. All care will be taken to prevent these side effects. If therapy is given over a long time, atrophy and wasting in the muscle injected may occur. Occasionally the patient's become refractory to treatment because they develop antibodies to the toxin. In this event, therapy needs to be modified.  I have read the above information and consent to the administration of botulism toxin.    BOTOX PROCEDURE NOTE FOR MIGRAINE HEADACHE    Contraindications and precautions discussed with patient(above). Aseptic procedure was observed and patient tolerated procedure. Procedure performed by Dr. Georgia Dom  The condition has existed for more than 6 months, and pt does not have a diagnosis of ALS, Myasthenia Gravis or Lambert-Eaton Syndrome.  Risks and benefits of injections discussed and pt agrees to proceed with the procedure.  Written consent obtained  These injections are medically necessary. Pt  receives good benefits from these injections. These injections do not cause sedations or hallucinations which the oral therapies may cause.  Description of procedure:  The patient was placed in a sitting position. The standard protocol was used for Botox as follows, with 5 units of Botox injected at each site:   -Procerus muscle, midline injection  -Corrugator muscle, bilateral injection  -Frontalis muscle, bilateral injection, with 2 sites each side, medial injection was performed in the upper one third  of the frontalis muscle, in the region vertical from the medial inferior edge of the superior orbital rim. The lateral injection was again in the upper  one third of the forehead vertically above the lateral limbus of the cornea, 1.5 cm lateral to the medial injection site.  -Temporalis muscle injection, 4 sites, bilaterally. The first injection was 3 cm above the tragus of the ear, second injection site was 1.5 cm to 3 cm up from the first injection site in line with the tragus of the ear. The third injection site was 1.5-3 cm forward between the first 2 injection sites. The fourth injection site was 1.5 cm posterior to the second injection site.   -Occipitalis muscle injection, 3 sites, bilaterally. The first injection was done one half way between the occipital protuberance and the tip of the mastoid process behind the ear. The second injection site was done lateral and superior to the first, 1 fingerbreadth from the first injection. The third injection site was 1 fingerbreadth superiorly and medially from the first injection site.  -Cervical paraspinal muscle injection, 2 sites, bilateral knee first injection site was 1 cm from the midline of the cervical spine, 3 cm inferior to the lower border of the occipital protuberance. The second injection site was 1.5 cm superiorly and laterally to the first injection site.  -Trapezius muscle injection was performed at 3 sites, bilaterally. The first injection site was in the upper trapezius muscle halfway between the inflection point of the neck, and the acromion. The second injection site was one half way between the acromion and the first injection site. The third injection was done between the first injection site and the inflection point of the neck.   Will return for repeat injection in 3 months.   155 units of Botox was used, 45u Botox not injected was wasted. The patient tolerated the procedure well, there were no complications of the above procedure.

## 2022-04-14 ENCOUNTER — Encounter: Payer: Self-pay | Admitting: Internal Medicine

## 2022-04-14 NOTE — Progress Notes (Unsigned)
    Giannamarie Paulus T. Josedaniel Haye, MD, [place] at [facility] [place], [address]  Phone: [phone]  FAX: [phone]  Jacqueline Meza - 77 y.o. female  MRN [id number]  Date of Birth: 24-Feb-1945  Date: 04/16/2022  PCP: Venia Carbon, MD  Referral: Venia Carbon, MD  No chief complaint on file.  Subjective:   Jacqueline Meza is a 77 y.o. very pleasant female patient with There is no height or weight on file to calculate BMI. who presents with the following:  Patient presents with ongoing shoulder pain.    Review of Systems is noted in the HPI, as appropriate  Objective:   There were no vitals taken for this visit.  GEN: No acute distress; alert,appropriate. PULM: Breathing comfortably in no respiratory distress PSYCH: Normally interactive.   Laboratory and Imaging Data:  Assessment and Plan:   ***

## 2022-04-16 ENCOUNTER — Ambulatory Visit (INDEPENDENT_AMBULATORY_CARE_PROVIDER_SITE_OTHER)
Admission: RE | Admit: 2022-04-16 | Discharge: 2022-04-16 | Disposition: A | Discharge status: Home / Self Care | Payer: Medicare PPO | Source: Ambulatory Visit | Attending: Family Medicine | Admitting: Family Medicine

## 2022-04-16 ENCOUNTER — Ambulatory Visit: Payer: Medicare PPO | Admitting: Family Medicine

## 2022-04-16 ENCOUNTER — Encounter: Payer: Self-pay | Admitting: Family Medicine

## 2022-04-16 VITALS — BP 102/70 | HR 78 | Temp 97.3°F | Ht 61.5 in | Wt 131.4 lb

## 2022-04-16 DIAGNOSIS — M25511 Pain in right shoulder: Secondary | ICD-10-CM | POA: Diagnosis not present

## 2022-04-16 DIAGNOSIS — M7541 Impingement syndrome of right shoulder: Secondary | ICD-10-CM

## 2022-04-16 MED ORDER — DICLOFENAC SODIUM 75 MG PO TBEC: 75.0000 mg | DELAYED_RELEASE_TABLET | Freq: Two times a day (BID) | ORAL | 3 refills | Status: DC

## 2022-04-16 MED ORDER — TRIAMCINOLONE ACETONIDE 40 MG/ML IJ SUSP
40.0000 mg | Freq: Once | INTRAMUSCULAR | Status: AC
  Administered 2022-04-16: 40 mg via INTRA_ARTICULAR

## 2022-04-21 ENCOUNTER — Other Ambulatory Visit: Payer: Self-pay | Admitting: *Deleted

## 2022-04-21 DIAGNOSIS — G43709 Chronic migraine without aura, not intractable, without status migrainosus: Secondary | ICD-10-CM

## 2022-04-21 MED ORDER — EMGALITY 120 MG/ML ~~LOC~~ SOAJ: 120.0000 mg | SUBCUTANEOUS | 11 refills | Status: DC

## 2022-04-21 NOTE — Telephone Encounter (Signed)
Renewed at pts requested pharmacy.

## 2022-04-22 ENCOUNTER — Telehealth: Payer: Self-pay | Admitting: Neurology

## 2022-04-22 NOTE — Telephone Encounter (Signed)
error 

## 2022-05-27 ENCOUNTER — Ambulatory Visit: Payer: Medicare PPO | Admitting: Dermatology

## 2022-05-27 VITALS — BP 110/66

## 2022-05-27 DIAGNOSIS — W908XXA Exposure to other nonionizing radiation, initial encounter: Secondary | ICD-10-CM | POA: Diagnosis not present

## 2022-05-27 DIAGNOSIS — D2271 Melanocytic nevi of right lower limb, including hip: Secondary | ICD-10-CM

## 2022-05-27 DIAGNOSIS — L2389 Allergic contact dermatitis due to other agents: Secondary | ICD-10-CM

## 2022-05-27 DIAGNOSIS — L814 Other melanin hyperpigmentation: Secondary | ICD-10-CM

## 2022-05-27 DIAGNOSIS — L578 Other skin changes due to chronic exposure to nonionizing radiation: Secondary | ICD-10-CM | POA: Diagnosis not present

## 2022-05-27 DIAGNOSIS — L821 Other seborrheic keratosis: Secondary | ICD-10-CM | POA: Diagnosis not present

## 2022-05-27 DIAGNOSIS — Z85828 Personal history of other malignant neoplasm of skin: Secondary | ICD-10-CM

## 2022-05-27 DIAGNOSIS — Z872 Personal history of diseases of the skin and subcutaneous tissue: Secondary | ICD-10-CM

## 2022-05-27 DIAGNOSIS — X32XXXA Exposure to sunlight, initial encounter: Secondary | ICD-10-CM | POA: Diagnosis not present

## 2022-05-27 DIAGNOSIS — Z1283 Encounter for screening for malignant neoplasm of skin: Secondary | ICD-10-CM

## 2022-05-27 DIAGNOSIS — D229 Melanocytic nevi, unspecified: Secondary | ICD-10-CM

## 2022-05-27 DIAGNOSIS — Z79899 Other long term (current) drug therapy: Secondary | ICD-10-CM

## 2022-05-27 DIAGNOSIS — L820 Inflamed seborrheic keratosis: Secondary | ICD-10-CM | POA: Diagnosis not present

## 2022-05-27 DIAGNOSIS — Z86018 Personal history of other benign neoplasm: Secondary | ICD-10-CM

## 2022-05-27 MED ORDER — TRETINOIN 0.025 % EX CREA: TOPICAL_CREAM | Freq: Every day | CUTANEOUS | 4 refills | Status: AC

## 2022-05-27 NOTE — Patient Instructions (Addendum)
Recommend Cerave Cream for moisturizer Recommend over the counter Hydrocortisone cream for rash on chest    Due to recent changes in healthcare laws, you may see results of your pathology and/or laboratory studies on MyChart before the doctors have had a chance to review them. We understand that in some cases there may be results that are confusing or concerning to you. Please understand that not all results are received at the same time and often the doctors may need to interpret multiple results in order to provide you with the best plan of care or course of treatment. Therefore, we ask that you please give [place] 2 business days to thoroughly review all your results before contacting the office for clarification. Should we see a critical lab result, you will be contacted sooner.   If You Need Anything After Your Visit  If you have any questions or concerns for your doctor, please call our main line at [phone] and press option 4 to reach your doctor's medical assistant. If no one answers, please leave a voicemail as directed and we will return your call as soon as possible. Messages left after 4 pm will be answered the following business day.   You may also send [place] a message via MyChart. We typically respond to MyChart messages within 1-2 business days.  For prescription refills, please ask your pharmacy to contact our office. Our fax number is [phone].  If you have an urgent issue when the clinic is closed that cannot wait until the next business day, you can page your doctor at the number below.    Please note that while we do our best to be available for urgent issues outside of office hours, we are not available 24/7.   If you have an urgent issue and are unable to reach [place], you may choose to seek medical care at your doctor's office, retail clinic, urgent care center, or emergency room.  If you have a medical emergency, please immediately call 911 or go to the emergency  department.  Pager Numbers  - Dr. Gwen Pounds: [phone]  - Dr. Neale Burly: [phone]  - Dr. Roseanne Reno: [phone]  In the event of inclement weather, please call our main line at [phone] for an update on the status of any delays or closures.  Dermatology Medication Tips: Please keep the boxes that topical medications come in in order to help keep track of the instructions about where and how to use these. Pharmacies typically print the medication instructions only on the boxes and not directly on the medication tubes.   If your medication is too expensive, please contact our office at [phone] option 4 or send [place] a message through MyChart.   We are unable to tell what your co-pay for medications will be in advance as this is different depending on your insurance coverage. However, we may be able to find a substitute medication at lower cost or fill out paperwork to get insurance to cover a needed medication.   If a prior authorization is required to get your medication covered by your insurance company, please allow [place] 1-2 business days to complete this process.  Drug prices often vary depending on where the prescription is filled and some pharmacies may offer cheaper prices.  The website www.goodrx.com contains coupons for medications through different pharmacies. The prices here do not account for what the cost may be with help from insurance (it may be cheaper with your insurance), but the website can give you the price if  you did not use any insurance.  - You can print the associated coupon and take it with your prescription to the pharmacy.  - You may also stop by our office during regular business hours and pick up a GoodRx coupon card.  - If you need your prescription sent electronically to a different pharmacy, notify our office through [facility] or by phone at [phone] option 4.     Si Usted Necesita Algo Despus de Su Visita  Tambin puede enviarnos un  mensaje a travs de Pharmacist, community. Por lo general respondemos a los mensajes de MyChart en el transcurso de 1 a 2 das hbiles.  Para renovar recetas, por favor pida a su farmacia que se ponga en contacto con nuestra oficina. Harland Dingwall de fax es [place] [phone].  Si tiene un asunto urgente cuando la clnica est cerrada y que no puede esperar hasta el siguiente da hbil, puede llamar/localizar a su doctor(a) al nmero que aparece a continuacin.   Por favor, tenga en cuenta que aunque hacemos todo lo posible para estar disponibles para asuntos urgentes fuera del horario de [place], no estamos disponibles las 24 horas del da, los 7 das de la [place].   Si tiene un problema urgente y no puede comunicarse con nosotros, puede optar por buscar atencin mdica  en el consultorio de su doctor(a), en una clnica privada, en un centro de atencin urgente o en una sala de emergencias.  Si tiene Engineering geologist, por favor llame inmediatamente al 911 o vaya a la sala de emergencias.  Nmeros de bper  - Dr. Nehemiah Massed: [phone]  - Dra. Moye: [phone]  - Dra. Nicole Kindred: [phone]  En caso de inclemencias del [place], por favor llame a Johnsie Kindred principal al [phone] para una actualizacin sobre el [place] de cualquier retraso o cierre.  Consejos para la medicacin en dermatologa: Por favor, guarde las cajas en las que vienen los medicamentos de uso tpico para ayudarle a seguir las instrucciones sobre dnde y cmo usarlos. Las farmacias generalmente imprimen las instrucciones del medicamento slo en las cajas y no directamente en los tubos del [place].   Si su medicamento es muy caro, por favor, pngase en contacto con Zigmund Daniel llamando al [phone] y presione la opcin 4 o envenos un mensaje a travs de Pharmacist, community.   No podemos decirle cul ser su copago por los medicamentos por adelantado ya que esto es diferente dependiendo de la cobertura de su seguro. Sin embargo,  es posible que podamos encontrar un medicamento sustituto a Electrical engineer un formulario para que el seguro cubra el medicamento que se considera necesario.   Si se requiere una autorizacin previa para que su compaa de seguros [place] su medicamento, por favor permtanos de 1 a 2 das hbiles para completar este proceso.  Los precios de los medicamentos varan con frecuencia dependiendo del Environmental consultant de dnde se surte la receta y alguna farmacias pueden ofrecer precios ms baratos.  El sitio web www.goodrx.com tiene cupones para medicamentos de Airline pilot. Los precios aqu no tienen en cuenta lo que podra costar con la ayuda del seguro (puede ser ms barato con su seguro), pero el sitio web puede darle el precio si no utiliz Research scientist (physical sciences).  - Puede imprimir el cupn correspondiente y llevarlo con su receta a la farmacia.  - Tambin puede pasar por nuestra oficina durante el horario de atencin regular y Charity fundraiser una tarjeta de cupones de GoodRx.  - Si necesita que su receta  se enve electrnicamente a una farmacia diferente, informe a nuestra oficina a travs de MyChart de [place] o por telfono llamando al [phone] y presione la opcin 4.

## 2022-05-27 NOTE — Progress Notes (Signed)
Follow-Up Visit   Subjective  Jacqueline Meza is a 77 y.o. female who presents for the following: Skin Cancer Screening and Full Body Skin Exam, hx of BCCs, Dysplastic Nevi, Aks, check spot R post shoulder rough, Rash 2wks, used a new body lotion that broke her out, improving  The patient presents for Total-Body Skin Exam (TBSE) for skin cancer screening and mole check. The patient has spots, moles and lesions to be evaluated, some may be new or changing and the patient has concerns that these could be cancer.    The following portions of the chart were reviewed this encounter and updated as appropriate: medications, allergies, medical history  Review of Systems:  No other skin or systemic complaints except as noted in HPI or Assessment and Plan.  Objective  Well appearing patient in no apparent distress; mood and affect are within normal limits.  A full examination was performed including scalp, head, eyes, ears, nose, lips, neck, chest, axillae, abdomen, back, buttocks, bilateral upper extremities, bilateral lower extremities, hands, feet, fingers, toes, fingernails, and toenails. All findings within normal limits unless otherwise noted below.   Relevant physical exam findings are noted in the Assessment and Plan.  R post shoulder x 2 (2) Stuck on waxy paps with erythema    Assessment & Plan   LENTIGINES, SEBORRHEIC KERATOSES, HEMANGIOMAS - Benign normal skin lesions - Benign-appearing - Call for any changes  MELANOCYTIC NEVI - Tan-brown and/or pink-flesh-colored symmetric macules and papules - Benign appearing on exam today - Observation - Call clinic for new or changing moles - Recommend daily use of broad spectrum spf 30+ sunscreen to sun-exposed areas.   ACTINIC DAMAGE - Chronic condition, secondary to cumulative UV/sun exposure - diffuse scaly erythematous macules with underlying dyspigmentation - Recommend daily broad spectrum sunscreen SPF 30+ to sun-exposed  areas, reapply every 2 hours as needed.  - Staying in the shade or wearing long sleeves, sun glasses (UVA+UVB protection) and wide brim hats (4-inch brim around the entire circumference of the hat) are also recommended for sun protection.  - Call for new or changing lesions.  SKIN CANCER SCREENING PERFORMED TODAY.  MELANOCYTIC NEVUS Exam: 0.3cm brown macule R ant sole  Treatment Plan: Benign-appearing. Stable compared to previous visit. Observation.  Call clinic for new or changing moles.  Recommend daily use of broad spectrum spf 30+ sunscreen to sun-exposed areas.    Inflamed seborrheic keratosis (2) R post shoulder x 2  Symptomatic, irritating, patient would like treated.   Destruction of lesion - R post shoulder x 2 Complexity: simple   Destruction method: cryotherapy   Informed consent: discussed and consent obtained   Timeout:  patient name, date of birth, surgical site, and procedure verified Lesion destroyed using liquid nitrogen: Yes   Region frozen until ice ball extended beyond lesion: Yes   Outcome: patient tolerated procedure well with no complications   Post-procedure details: wound care instructions given     HISTORY OF BASAL CELL CARCINOMA OF THE SKIN - No evidence of recurrence today - Recommend regular full body skin exams - Recommend daily broad spectrum sunscreen SPF 30+ to sun-exposed areas, reapply every 2 hours as needed.  - Call if any new or changing lesions are noted between office visits  - L upper back, L ant scalp, L nasal tip  HISTORY OF DYSPLASTIC NEVUS No evidence of recurrence today Recommend regular full body skin exams Recommend daily broad spectrum sunscreen SPF 30+ to sun-exposed areas, reapply every 2 hours  as needed.  Call if any new or changing lesions are noted between office visits  -R ant ankle, R prox drosum foot  RESOLVING CONTACT DERMATITIS Secondary to New body lotion (Olay Body Lotion) Exam: resolving pink paps  Treatment  Plan: Discussed topical treatment, pt declines Recommend OTC Hydrocortisone 1% cr Recommend Cerave cream Avoid Olay Body Lotion   HISTORY OF PRECANCEROUS ACTINIC KERATOSIS - site(s) of PreCancerous Actinic Keratosis clear today. - these may recur and new lesions may form requiring treatment to prevent transformation into skin cancer - observe for new or changing spots and contact [facility] for appointment if occur - photoprotection with sun protective clothing; sunglasses and broad spectrum sunscreen with SPF of at least 30 + and frequent self skin exams recommended - yearly exams by a dermatologist recommended for persons with history of PreCancerous Actinic Keratoses    SEBORRHEIC KERATOSIS / LENTIGOS Face Exam: waxy brown macules face  Treatment: Discussed LN2, Tretinoin, Urea cream  Start Tretinoin 0.025% cr qhs to dark spots face  Topical retinoid medications like tretinoin/Retin-A, adapalene/Differin, tazarotene/Fabior, and Epiduo/Epiduo Forte can cause dryness and irritation when first started. Only apply a pea-sized amount to the entire affected area. Avoid applying it around the eyes, edges of mouth and creases at the nose. If you experience irritation, use a good moisturizer first and/or apply the medicine less often. If you are doing well with the medicine, you can increase how often you use it until you are applying every night. Be careful with sun protection while using this medication as it can make you sensitive to the sun. This medicine should not be used by pregnant women.     Return in about 1 year (around 05/27/2023) for TBSE, Hx of BCC, Hx of Dysplastic nevi, Hx of AKs.  I, Ardis Rowan, RMA, am acting as scribe for Armida Sans, MD .   Documentation: I have reviewed the above documentation for accuracy and completeness, and I agree with the above.  Armida Sans, MD

## 2022-06-02 NOTE — Progress Notes (Unsigned)
    [name] [name], MD, CAQ Sports Medicine [facility] at [facility] [address] [place], [address]  Phone: [phone]  FAX: [phone]  Jacqueline Meza - 77 y.o. female  MRN [id number]  Date of Birth: 1945-07-04  Date: 06/03/2022  PCP: [name], MD  Referral: [name], MD  No chief complaint on file.  Subjective:   Jacqueline Meza is a 77 y.o. very pleasant female patient with There is no height or weight on file to calculate BMI. who presents with the following:  She is here to follow-up on right-sided shoulder pain, and the last time I saw her in the early part of April, 2024, did feel like she was having some impingement of the right shoulder.  At that point did have her start a home rehab program, and I did do a shoulder injection.  She is here today to follow-up regarding all of this.    Review of Systems is noted in the HPI, as appropriate  Objective:   There were no vitals taken for this visit.  GEN: No acute distress; alert,appropriate. PULM: Breathing comfortably in no respiratory distress PSYCH: Normally interactive.   Laboratory and Imaging Data:  Assessment and Plan:   ***

## 2022-06-03 ENCOUNTER — Encounter: Payer: Self-pay | Admitting: Family Medicine

## 2022-06-03 ENCOUNTER — Encounter: Payer: Self-pay | Admitting: Dermatology

## 2022-06-03 ENCOUNTER — Ambulatory Visit: Payer: Medicare PPO | Admitting: Family Medicine

## 2022-06-03 VITALS — BP 100/70 | HR 66 | Temp 97.5°F | Ht 66.0 in | Wt 125.1 lb

## 2022-06-03 DIAGNOSIS — M7541 Impingement syndrome of right shoulder: Secondary | ICD-10-CM | POA: Diagnosis not present

## 2022-06-03 DIAGNOSIS — M25511 Pain in right shoulder: Secondary | ICD-10-CM

## 2022-07-06 ENCOUNTER — Ambulatory Visit: Payer: Medicare PPO | Admitting: Neurology

## 2022-07-06 DIAGNOSIS — G43709 Chronic migraine without aura, not intractable, without status migrainosus: Secondary | ICD-10-CM | POA: Diagnosis not present

## 2022-07-06 MED ORDER — ONABOTULINUMTOXINA 200 UNITS IJ SOLR
155.0000 [IU] | Freq: Once | INTRAMUSCULAR | Status: AC
  Administered 2022-07-06: 155 [IU] via INTRAMUSCULAR

## 2022-07-06 NOTE — Progress Notes (Unsigned)
Botox- 200 units x 1 vial Lot: [id number] Expiration: 08/2024 NDC: 4540-9811-91  Bacteriostatic 0.9% Sodium Chloride- 4 mL  Lot: [id number] Expiration: 04/13/2023 NDC: 6213-0865-78  Dx: [id number] B/B Witnessed by Langley Gauss

## 2022-07-06 NOTE — Progress Notes (Unsigned)
Consent Form Botulism Toxin Injection For Chronic Migraine  04/13/2022: Decreased topiramate to 100mg qhs was bid. 2 migraines last 3 months, sumatriptan works. Emgalityalso working.  01/19/2022; Stable, Still excellent response >> 75% improvement freq and severity. 10/19/2021: doing well still, stable 07/30/2021: stable 05/05/2021: stable 01/28/2021: hasn't had one migraine or headache tremendous improvement.   10/29/2020:  Still excellent response >> 75% improvement freq and severity. She has two 6 year old twin grandchildren, a boy and a girl, kindergarten.   Reviewed orally with patient, additionally signature is on file:  Botulism toxin has been approved by the Federal drug administration for treatment of chronic migraine. Botulism toxin does not cure chronic migraine and it may not be effective in some patients.  The administration of botulism toxin is accomplished by injecting a small amount of toxin into the muscles of the neck and head. Dosage must be titrated for each individual. Any benefits resulting from botulism toxin tend to wear off after 3 months with a repeat injection required if benefit is to be maintained. Injections are usually done every 3-4 months with maximum effect peak achieved by about 2 or 3 weeks. Botulism toxin is expensive and you should be sure of what costs you will incur resulting from the injection.  The side effects of botulism toxin use for chronic migraine may include:   -Transient, and usually mild, facial weakness with facial injections  -Transient, and usually mild, head or neck weakness with head/neck injections  -Reduction or loss of forehead facial animation due to forehead muscle weakness  -Eyelid drooping  -Dry eye  -Pain at the site of injection or bruising at the site of injection  -Double vision  -Potential unknown long term risks  Contraindications: You should not have Botox if you are pregnant, nursing, allergic to albumin, have an infection,  skin condition, or muscle weakness at the site of the injection, or have myasthenia gravis, Lambert-Eaton syndrome, or ALS.  It is also possible that as with any injection, there may be an allergic reaction or no effect from the medication. Reduced effectiveness after repeated injections is sometimes seen and rarely infection at the injection site may occur. All care will be taken to prevent these side effects. If therapy is given over a long time, atrophy and wasting in the muscle injected may occur. Occasionally the patient's become refractory to treatment because they develop antibodies to the toxin. In this event, therapy needs to be modified.  I have read the above information and consent to the administration of botulism toxin.    BOTOX PROCEDURE NOTE FOR MIGRAINE HEADACHE    Contraindications and precautions discussed with patient(above). Aseptic procedure was observed and patient tolerated procedure. Procedure performed by Dr. Toni Blessing Ozga  The condition has existed for more than 6 months, and pt does not have a diagnosis of ALS, Myasthenia Gravis or Lambert-Eaton Syndrome.  Risks and benefits of injections discussed and pt agrees to proceed with the procedure.  Written consent obtained  These injections are medically necessary. Pt  receives good benefits from these injections. These injections do not cause sedations or hallucinations which the oral therapies may cause.  Description of procedure:  The patient was placed in a sitting position. The standard protocol was used for Botox as follows, with 5 units of Botox injected at each site:   -Procerus muscle, midline injection  -Corrugator muscle, bilateral injection  -Frontalis muscle, bilateral injection, with 2 sites each side, medial injection was performed in the upper one third   of the frontalis muscle, in the region vertical from the medial inferior edge of the superior orbital rim. The lateral injection was again in the upper  one third of the forehead vertically above the lateral limbus of the cornea, 1.5 cm lateral to the medial injection site.  -Temporalis muscle injection, 4 sites, bilaterally. The first injection was 3 cm above the tragus of the ear, second injection site was 1.5 cm to 3 cm up from the first injection site in line with the tragus of the ear. The third injection site was 1.5-3 cm forward between the first 2 injection sites. The fourth injection site was 1.5 cm posterior to the second injection site.   -Occipitalis muscle injection, 3 sites, bilaterally. The first injection was done one half way between the occipital protuberance and the tip of the mastoid process behind the ear. The second injection site was done lateral and superior to the first, 1 fingerbreadth from the first injection. The third injection site was 1 fingerbreadth superiorly and medially from the first injection site.  -Cervical paraspinal muscle injection, 2 sites, bilateral knee first injection site was 1 cm from the midline of the cervical spine, 3 cm inferior to the lower border of the occipital protuberance. The second injection site was 1.5 cm superiorly and laterally to the first injection site.  -Trapezius muscle injection was performed at 3 sites, bilaterally. The first injection site was in the upper trapezius muscle halfway between the inflection point of the neck, and the acromion. The second injection site was one half way between the acromion and the first injection site. The third injection was done between the first injection site and the inflection point of the neck.   Will return for repeat injection in 3 months.   155 units of Botox was used, 45u Botox not injected was wasted. The patient tolerated the procedure well, there were no complications of the above procedure.    

## 2022-08-02 NOTE — Progress Notes (Unsigned)
    Jacqueline Meza [name], MD, CAQ Sports Medicine [facility] at [facility] [address] [place], [address]  Phone: [phone]  FAX: [phone]  Jacqueline Meza - 77 y.o. female  MRN [id number]  Date of Birth: [birth year]  Date: 08/03/2022  PCP: [name], MD  Referral: [name], MD  No chief complaint on file.  Subjective:   Jacqueline Meza is a 77 y.o. very pleasant female patient with There is no height or weight on file to calculate BMI. who presents with the following:  She presents today in follow-up regarding right-sided arm pain.  I have seen her twice in the last 3 to 4 months for some shoulder impingement, but the last time I saw her in late May she was entirely asymptomatic and fully better.  She had been doing a good job with her home rehab program, and also did a prior shoulder injection for her 3 to 4 months ago.    Review of Systems is noted in the HPI, as appropriate  Objective:   There were no vitals taken for this visit.  GEN: No acute distress; alert,appropriate. PULM: Breathing comfortably in no respiratory distress PSYCH: Normally interactive.   Laboratory and Imaging Data:  Assessment and Plan:   ***

## 2022-08-03 ENCOUNTER — Encounter: Payer: Self-pay | Admitting: Family Medicine

## 2022-08-03 ENCOUNTER — Ambulatory Visit: Payer: Medicare PPO | Admitting: Family Medicine

## 2022-08-03 VITALS — BP 126/70 | HR 80 | Temp 98.0°F | Ht 66.0 in | Wt 125.2 lb

## 2022-08-03 DIAGNOSIS — M25511 Pain in right shoulder: Secondary | ICD-10-CM

## 2022-08-03 DIAGNOSIS — M7541 Impingement syndrome of right shoulder: Secondary | ICD-10-CM | POA: Diagnosis not present

## 2022-08-03 MED ORDER — TRIAMCINOLONE ACETONIDE 40 MG/ML IJ SUSP
40.0000 mg | Freq: Once | INTRAMUSCULAR | Status: AC
  Administered 2022-08-03: 40 mg via INTRA_ARTICULAR

## 2022-09-15 ENCOUNTER — Other Ambulatory Visit: Payer: Self-pay | Admitting: Medical Genetics

## 2022-09-15 ENCOUNTER — Telehealth: Payer: Self-pay | Admitting: Internal Medicine

## 2022-09-15 DIAGNOSIS — Z006 Encounter for examination for normal comparison and control in clinical research program: Secondary | ICD-10-CM

## 2022-09-15 NOTE — Telephone Encounter (Signed)
Patient called in and stated that she signed up for [facility]. She was wanting to know if she could have her blood work done here or if she needs to go to another [place] location. Please advise. Thank you!

## 2022-09-17 NOTE — Telephone Encounter (Signed)
Sent patient my chart message with information.  No further action needed at this time.

## 2022-09-21 ENCOUNTER — Other Ambulatory Visit
Admission: RE | Admit: 2022-09-21 | Discharge: 2022-09-21 | Disposition: A | Discharge status: Home / Self Care | Payer: Medicare PPO | Attending: Medical Genetics | Admitting: Medical Genetics

## 2022-09-21 DIAGNOSIS — Z006 Encounter for examination for normal comparison and control in clinical research program: Secondary | ICD-10-CM | POA: Insufficient documentation

## 2022-09-29 LAB — GENECONNECT MOLECULAR SCREEN: Genetic Analysis Overall Interpretation: NEGATIVE

## 2022-10-06 ENCOUNTER — Ambulatory Visit: Payer: Medicare PPO | Admitting: Neurology

## 2022-10-15 ENCOUNTER — Other Ambulatory Visit: Payer: Self-pay | Admitting: Family Medicine

## 2022-10-15 NOTE — Telephone Encounter (Signed)
Voltaren tab Last filled:  08/09/22, #60 Last OV:  08/03/22,  R shoulder pain

## 2022-11-10 ENCOUNTER — Ambulatory Visit: Payer: Medicare PPO | Admitting: Neurology

## 2022-11-10 ENCOUNTER — Encounter: Payer: Self-pay | Admitting: Neurology

## 2022-11-10 VITALS — BP 134/67 | HR 67 | Ht 61.0 in | Wt 124.0 lb

## 2022-11-10 DIAGNOSIS — G43709 Chronic migraine without aura, not intractable, without status migrainosus: Secondary | ICD-10-CM | POA: Diagnosis not present

## 2022-11-10 MED ORDER — ONABOTULINUMTOXINA 200 UNITS IJ SOLR
155.0000 [IU] | Freq: Once | INTRAMUSCULAR | Status: AC
  Administered 2022-11-10: 155 [IU] via INTRAMUSCULAR

## 2022-11-10 NOTE — Progress Notes (Signed)
Botox 200 units x 1 vial  LOT #: [id number] EXP: 02/2025 NDC: 5409-8119-14  Bacteriostatic Sodium Chloride 0.9%  LOT: [id number] EXP: 04/13/2023 NDC: [id number]   Witnessed by Florentina Addison  BB

## 2022-11-10 NOTE — Progress Notes (Signed)
   BOTOX PROCEDURE NOTE FOR MIGRAINE HEADACHE  HISTORY: Jacqueline Meza is here for Botox. Last was 07/06/22 with Dr. Lucia Gaskins. Remains on Emgality, has come off Topamax completely!!Uses Imitrex PRN. Botox work has worked Firef[employer]. In the last 4 months only 1 migraine, Imitrex works great!!   Description of procedure:  The patient was placed in a sitting position. The standard protocol was used for Botox as follows, with 5 units of Botox injected at each site:  -Procerus muscle, midline injection  -Corrugator muscle, bilateral injection  -Frontalis muscle, bilateral injection, with 2 sites each side, medial injection was performed in the upper one third of the frontalis muscle, in the region vertical from the medial inferior edge of the superior orbital rim. The lateral injection was again in the upper one third of the forehead vertically above the lateral limbus of the cornea, 1.5 cm lateral to the medial injection site.  -Temporalis muscle injection, 4 sites, bilaterally. The first injection was 3 cm above the tragus of the ear, second injection site was 1.5 cm to 3 cm up from the first injection site in line with the tragus of the ear. The third injection site was 1.5-3 cm forward between the first 2 injection sites. The fourth injection site was 1.5 cm posterior to the second injection site.  -Occipitalis muscle injection, 3 sites, bilaterally. The first injection was done one half way between the occipital protuberance and the tip of the mastoid process behind the ear. The second injection site was done lateral and superior to the first, 1 fingerbreadth from the first injection. The third injection site was 1 fingerbreadth superiorly and medially from the first injection site.  -Cervical paraspinal muscle injection, 2 sites, bilateral, the first injection site was 1 cm from the midline of the cervical spine, 3 cm inferior to the lower border of the occipital protuberance. The second injection site was 1.5 cm  superiorly and laterally to the first injection site.  -Trapezius muscle injection was performed at 3 sites, bilaterally. The first injection site was in the upper trapezius muscle halfway between the inflection point of the neck, and the acromion. The second injection site was one half way between the acromion and the first injection site. The third injection was done between the first injection site and the inflection point of the neck.   A 200 unit bottle of Botox was used, 155 units were injected, the rest of the Botox was wasted. The patient tolerated the procedure well, there were no complications of the above procedure.  Botox NDC 9562-1308-65 Lot number [id number] Expiration date 02/2025 BB

## 2022-11-20 ENCOUNTER — Telehealth: Payer: Self-pay | Admitting: Internal Medicine

## 2022-11-20 NOTE — Telephone Encounter (Signed)
Pt want a TOC from Dr. Alphonsus Sias to NP Arnett because she is closer to [place] and Dr. Alphonsus Sias is retiring

## 2023-01-18 ENCOUNTER — Ambulatory Visit: Payer: Medicare PPO | Admitting: Family Medicine

## 2023-01-18 ENCOUNTER — Encounter: Payer: Self-pay | Admitting: Family Medicine

## 2023-01-18 VITALS — BP 130/80 | HR 80 | Temp 97.3°F | Ht 66.0 in | Wt 131.4 lb

## 2023-01-18 DIAGNOSIS — M7541 Impingement syndrome of right shoulder: Secondary | ICD-10-CM

## 2023-01-18 DIAGNOSIS — M7702 Medial epicondylitis, left elbow: Secondary | ICD-10-CM | POA: Diagnosis not present

## 2023-01-18 DIAGNOSIS — G2589 Other specified extrapyramidal and movement disorders: Secondary | ICD-10-CM | POA: Diagnosis not present

## 2023-01-18 MED ORDER — PREDNISONE 20 MG PO TABS: ORAL_TABLET | ORAL | 0 refills | Status: DC

## 2023-01-18 NOTE — Progress Notes (Signed)
 [name] T. [name], MD, CAQ Sports Medicine [facility] at [facility] [address] [place], [address]  Phone: [phone]  FAX: [phone]  Jacqueline Meza - 78 y.o. female  MRN [id number]  Date of Birth: 11-03-1945  Date: 01/18/2023  PCP: Jimmy Charlie FERNS, MD  Referral: Jimmy Charlie FERNS, MD  Chief Complaint  Patient presents with   Shoulder Pain    Right   Elbow Pain    Left   Subjective:   Jacqueline Meza is a 78 y.o. very pleasant female patient with Body mass index is 21.2 kg/m. who presents with the following:  F/u R shoulder - saw several times in 2024 and was doing ok with home rehab, and had a shoulder injection in the past.   At this point, she has been having worsening pain over the last 2 months, and she has pain in the right shoulder as well as in the posterior aspect of the shoulder.  She does not have any new or focal changes to neck pain.  She does not have any radicular signs or symptoms.  She does have some pain with terminal abduction and rotational maneuvers she has had a shoulder injection.  She also has been doing some home rehab for her shoulder.  Newer development of left-sided medial elbow pain at the medial epicondyle.  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  2 months -   Review of Systems is noted in the HPI, as appropriate  Objective:   BP 130/80 (BP Location: Right Arm, Patient Position: Sitting, Cuff Size: Normal)   Pulse 80   Temp (!) 97.3 F (36.3 C) (Temporal)   Ht 5' 6 (1.676 m)   Wt 131 lb 6 oz (59.6 kg)   SpO2 99%   BMI 21.20 kg/m   GEN: No acute distress; alert,appropriate. PULM: Breathing comfortably in no respiratory distress PSYCH: Normally interactive.    Shoulder: R Inspection: No muscle wasting or winging Ecchymosis/edema: neg  AC joint, scapula, clavicle: NT Cervical spine: NT, full ROM Spurling's:  neg Abduction: full, 5/5 Flexion: full, 5/5 IR, full, lift-off: 5/5 ER at neutral: full, 5/5 AC crossover: neg Neer: pos minimally Hawkins: pos minimally Drop Test: neg Jobe: pos -mild Supraspinatus insertion: mild-mod T Bicipital groove: NT Speed's: neg Yergason's: neg Sulcus sign: neg Scapular dyskinesis: Forward sloping scapulas and tenderness around the parascapular musculature on the right   Elbow: L Ecchymosis or edema: neg ROM: full flexion, extension, pronation, supination Shoulder ROM: Full Flexion: 5/5 Extension: 5/5 Supination: 5/5  Pronation: 5/5 Wrist ext: 5/5 Wrist flexion: 5/5 No gross bony abnormality Varus and Valgus stress: stable ECRB tenderness: neg Medial epicondyle: tender at flexor tendon insertion Lateral epicondyle, resisted wrist extension from wrist full pronation and flexion: NT Resisted pronation: very tender grip: 5/5  sensation intact   Laboratory and Imaging Data: CLINICAL DATA:  Six-month right shoulder pain.   EXAM: RIGHT SHOULDER - 2+ VIEW   COMPARISON:  None available   FINDINGS: Moderate acromioclavicular joint space narrowing and peripheral osteophytosis. Minimal inferior glenoid degenerative spurring. The glenohumeral joint space is maintained. No acute fracture or dislocation. The visualized portion of the right lung is unremarkable.   IMPRESSION: Moderate acromioclavicular osteoarthritis.     Electronically Signed   By: Tanda Lyons M.D.   On: 04/18/2022 14:48    Assessment and Plan:     ICD-10-CM  1. Impingement syndrome of right shoulder  M75.41 Ambulatory referral to Physical Therapy    2. Medial epicondylitis of elbow, left  M77.02 Ambulatory referral to Physical Therapy    3. Scapular dyskinesis  G25.89 Ambulatory referral to Physical Therapy     Acute on chronic right-sided shoulder pain with exacerbation.  At this point I think a lot of her symptoms are parascapular in origin with some  dyskinesis.  She does have some mild impingement, as well.  I think at this point doing some formal physical therapy would be quite helpful, and she is going to try to continue arm rehab and focusing on physical therapy.  She also has classic medial epicondylitis, suspect this is overuse.  A rehabilitation program from the [employer] was reviewed with the patient face to face for their condition.   I am also going to give a pulse of steroids to try to calm both of these down.  Medication Management during today's office visit: Meds ordered this encounter  Medications   predniSONE  (DELTASONE ) 20 MG tablet    Sig: 2 tabs po daily for 5 days, then 1 tab po daily for 5 days    Dispense:  15 tablet    Refill:  0   Medications Discontinued During This Encounter  Medication Reason   topiramate  (TOPAMAX ) 100 MG tablet Completed Course    Orders placed today for conditions managed today: Orders Placed This Encounter  Procedures   Ambulatory referral to Physical Therapy    Disposition: Return for Dr. Watt, 6-8 weeks.  Dragon [employer] One speech-to-text software was used for transcription in this dictation.  Possible transcriptional errors can occur using Animal nutritionist.   Signed,  Jacques DASEN. Angelena Sand, MD   Outpatient Encounter Medications as of 01/18/2023  Medication Sig   Cranberry-Milk Thistle (LIVER & KIDNEY CLEANSER PO) Take by mouth.   cyanocobalamin  (VITAMIN B12) 1000 MCG tablet Take 1,000 mcg by mouth daily.   diclofenac  (VOLTAREN ) 75 MG EC tablet TAKE 1 TABLET BY MOUTH 2 TIMES A DAY   Emollient (COLLAGEN EX) Apply topically.   Galcanezumab -gnlm (EMGALITY ) 120 MG/ML SOAJ Inject 120 mg into the skin every 30 (thirty) days.   Magnesium Citrate 200 MG TABS Take 1 tablet by mouth daily.   predniSONE  (DELTASONE ) 20 MG tablet 2 tabs po daily for 5 days, then 1 tab po daily for 5 days   SUMAtriptan  (IMITREX ) 100 MG tablet TAKE ONE TABLET BY MOUTH AT ONSET  OF HEADACHE; MAY REPEAT ONE TABLET IN 2 HOURS IF NEEDED.   tretinoin  (RETIN-A ) 0.025 % cream Apply topically at bedtime. Qhs to dark spots face   Turmeric (QC TUMERIC COMPLEX) 500 MG CAPS Take 1 capsule by mouth daily at 6 (six) AM.   [DISCONTINUED] topiramate  (TOPAMAX ) 100 MG tablet Take 1 tablet (100 mg total) by mouth daily.   Facility-Administered Encounter Medications as of 01/18/2023  Medication   botulinum toxin Type A  (BOTOX ) injection 155 Units

## 2023-01-20 ENCOUNTER — Telehealth: Payer: Self-pay | Admitting: Neurology

## 2023-01-20 NOTE — Telephone Encounter (Signed)
 Sent auth renewal request to [facility] via CMM, status is pending. Key: Farrel Conners

## 2023-01-21 NOTE — Telephone Encounter (Signed)
 Received fax of approval, pt will continue to be B/B.  Auth#: [id number] (01/13/20-01/12/24)

## 2023-02-02 ENCOUNTER — Encounter: Payer: Medicare PPO | Admitting: Internal Medicine

## 2023-02-02 ENCOUNTER — Other Ambulatory Visit: Payer: Self-pay | Admitting: Internal Medicine

## 2023-02-02 ENCOUNTER — Ambulatory Visit: Payer: Medicare PPO | Attending: Family Medicine

## 2023-02-02 DIAGNOSIS — M6281 Muscle weakness (generalized): Secondary | ICD-10-CM | POA: Diagnosis not present

## 2023-02-02 DIAGNOSIS — M25511 Pain in right shoulder: Secondary | ICD-10-CM | POA: Insufficient documentation

## 2023-02-02 DIAGNOSIS — M7541 Impingement syndrome of right shoulder: Secondary | ICD-10-CM | POA: Diagnosis not present

## 2023-02-02 DIAGNOSIS — G8929 Other chronic pain: Secondary | ICD-10-CM | POA: Diagnosis not present

## 2023-02-02 DIAGNOSIS — M7702 Medial epicondylitis, left elbow: Secondary | ICD-10-CM | POA: Diagnosis not present

## 2023-02-02 DIAGNOSIS — G2589 Other specified extrapyramidal and movement disorders: Secondary | ICD-10-CM | POA: Diagnosis not present

## 2023-02-02 DIAGNOSIS — Z1231 Encounter for screening mammogram for malignant neoplasm of breast: Secondary | ICD-10-CM

## 2023-02-02 NOTE — Therapy (Signed)
OUTPATIENT PHYSICAL THERAPY RIGHT SHOULDER  and LEFT ELBOW EVALUATION   Patient Name: Jacqueline Meza MRN: [id number] DOB:04-Jul-1945, 78 y.o., female Today's Date: 02/03/2023  END OF SESSION:  PT End of Session - 02/02/23 1413     Visit Number 1    Number of Visits 24    Date for PT Re-Evaluation 04/27/23    PT Start Time 1400    PT Stop Time 1450    PT Time Calculation (min) 50 min    Activity Tolerance Patient tolerated treatment well;No increased pain    Behavior During Therapy [facility] for tasks assessed/performed             Past Medical History:  Diagnosis Date   Actinic keratosis 12/12/2007   R leg - bx proven    Basal cell carcinoma 01/04/2007   L upper back - superficial    Basal cell carcinoma 11/08/2013   L ant scalp    Basal cell carcinoma 12/10/2014   L nasal tip    Basal cell carcinoma 06/30/2021   left nasal tip, schedule mohs   Dysplastic nevus 01/23/2008   R ant ankle - moderate   Dysplastic nevus 11/08/2012   R prox dorsum of foot - mild    Endometriosis    Hyperlipidemia    Migraines    since childhood   Raynaud's phenomenon    Past Surgical History:  Procedure Laterality Date   ABDOMINAL HYSTERECTOMY  01/12/1993   endometriosis and worsening migraines   CESAREAN SECTION  1988   COLONOSCOPY  2005   Dr Lemar Livings   DILATION AND CURETTAGE OF UTERUS     done after miscarriages twice   MOHS SURGERY     Left Side of Nose   Patient Active Problem List   Diagnosis Date Noted   Sleep disturbance 01/28/2022   Advance directive discussed with patient 06/04/2014   Hyperlipidemia    Routine general medical examination at a health care facility 04/11/2010   Chronic migraine without aura without status migrainosus, not intractable 10/27/2007    PCP: Dr. Tillman Abide  REFERRING PROVIDER: Dr. Karleen Hampshire Copland  REFERRING DIAG:  M77.02 (ICD-10-CM) - Medial epicondylitis of elbow, left  M75.41 (ICD-10-CM) - Impingement syndrome of right shoulder   G25.89 (ICD-10-CM) - Scapular dyskinesis    THERAPY DIAG:  Medial epicondylitis, left elbow  Chronic right shoulder pain  Muscle weakness (generalized)  Rationale for Evaluation and Treatment: Rehabilitation  ONSET DATE: > 6 months  SUBJECTIVE:  SUBJECTIVE STATEMENT: Patient reports some chronic Right shoulder intermittent pain- worse with reaching overhead and across body- states some difficulty with sleeping. 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   Hand dominance: Right  PERTINENT HISTORY: Per Dr. Patsy Lager note from 01/17/2022: GYPSIE LYBROOK is a 78 y.o. very pleasant female patient with Body mass index is 21.2 kg/m. who presents with the following:   F/u R shoulder - saw several times in 2024 and was doing ok with home rehab, and had a shoulder injection in the past.    At this point, she has been having worsening pain over the last 2 months, and she has pain in the right shoulder as well as in the posterior aspect of the shoulder.  She does not have any new or focal changes to neck pain.  She does not have any radicular signs or symptoms.  She does have some pain with terminal abduction and rotational maneuvers she has had a shoulder injection.  She also has been doing some home rehab for her shoulder.   Newer development of left-sided medial elbow pain at the medial epicondyle.  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    PAIN:  Are you having pain? Yes: NPRS scale: 0/10 at rest; 7/10 with some active ROM; best-0/10 Pain location: Right shoulder and left Elbow Pain description: sharp with movement and otherwise achy Aggravating factors:  Reaching with RUE- pulling hurts left elbow Relieving factors: Tylenol/voltaren, Stretching  PRECAUTIONS: None  RED FLAGS:    WEIGHT BEARING RESTRICTIONS: No  FALLS:  Has patient fallen in last 6 months? No  LIVING ENVIRONMENT: Lives with: lives alone Lives in: House/apartment Stairs: Yes: Internal: 17 steps; on left going up Has following equipment at home: None  OCCUPATION: Retired   PLOF: Independent  PATIENT GOALS:Be able to sleep without pain  NEXT MD VISIT:   OBJECTIVE:  Note: Objective measures were completed at Evaluation unless otherwise noted.  DIAGNOSTIC FINDINGS:  CLINICAL DATA:  Six-month right shoulder pain.   EXAM: RIGHT SHOULDER - 2+ VIEW   COMPARISON:  None available   FINDINGS: Moderate acromioclavicular joint space narrowing and peripheral osteophytosis. Minimal inferior glenoid degenerative spurring. The glenohumeral joint space is maintained. No acute fracture or dislocation. The visualized portion of the right lung is unremarkable.   IMPRESSION: Moderate acromioclavicular osteoarthritis.     Electronically Signed   By: Neita Garnet M.D.   On: 04/18/2022 14:48  PATIENT SURVEYS:  FOTO To be performed next visit  COGNITION: Overall cognitive status: Within functional limits for tasks assessed     SENSATION: WFL  POSTURE: Forward head and rounded shoulders  UPPER EXTREMITY ROM:   Active ROM Right eval Left eval  Shoulder flexion 173   Shoulder extension    Shoulder abduction 182   Shoulder adduction    Shoulder internal rotation    Shoulder external rotation 82   Elbow flexion    Elbow extension    Wrist flexion    Wrist extension    Wrist ulnar deviation    Wrist radial deviation    Wrist pronation    Wrist supination    (Blank rows = not tested)  UPPER EXTREMITY MMT:  MMT Right eval Left eval  Shoulder flexion 4 5  Shoulder extension 5 5  Shoulder abduction 4 5  Shoulder adduction 5 5  Shoulder  internal rotation 5 5  Shoulder external rotation 4+ 5  Middle trapezius    Lower trapezius    Elbow flexion 5 5  Elbow extension 5 5  Wrist flexion 5 4+  Wrist extension 5 5  Wrist ulnar deviation 5 5  Wrist radial deviation 5 5  Wrist pronation 5 5  Wrist supination 5 5  Grip strength (lbs) 47 42  (Blank rows = not tested)  SHOULDER SPECIAL TESTS: Impingement tests: Neer impingement test: positive  SLAP lesions: Crank test: negative Instability tests: Sulcus sign: negative Rotator cuff assessment: Empty can test: negative and Full can test: negative Biceps assessment: Yergason's test: negative and Speed's test: negative  JOINT MOBILITY TESTING:  Hypomobile GH inf glide  PALPATION:  (+) tenderness along anterior shoulder and AC joint (+) tenderness along medical epicondyle region of left elbow                                                                                                                             TREATMENT DATE: Evaluation of right shoulder and left elbow  *ice massage to left medial elbow x 3 min   PATIENT EDUCATION: Education details: PT plan of care, Instruction in symtoms and treatment of medical epicondylitis and shoulder impingement.  Person educated: Patient Education method: Explanation, Demonstration, and Verbal cues Education comprehension: verbalized understanding and returned demonstration  HOME EXERCISE PROGRAM: To be initiated next visit- handout for wrist flex stretch  ASSESSMENT:  CLINICAL IMPRESSION: Patient is a 78 y.o. female  who was seen today for physical therapy evaluation and treatment for Right shoulder and left elbow pain. She presents with painful left elbow mobility and intermittent impingement like symptoms with active right shoulder mobility. She will benefit from skilled PT services to improve her pain, functional ROM, and overall UE strength to enable her to improve sleep and return to PLOF with less pain.    OBJECTIVE IMPAIRMENTS: decreased ROM, decreased strength, hypomobility, impaired UE functional use, and pain.   ACTIVITY LIMITATIONS: carrying and lifting  PARTICIPATION LIMITATIONS: cleaning, laundry, driving, shopping, community activity, and yard work  PERSONAL FACTORS: 1 comorbidity: arthritis  are also affecting patient's functional outcome.   REHAB POTENTIAL: Good  CLINICAL DECISION MAKING: Evolving/moderate complexity  EVALUATION COMPLEXITY: Moderate   GOALS: Goals reviewed with patient? Yes  SHORT TERM GOALS: Target date: 03/17/2023  Pt will be independent with HEP in order to improve strength and balance in order to decrease fall risk and improve function at home and work.  Baseline: EVAL- Patient with no formal HEP Goal status: INITIAL  LONG TERM GOALS: Target date: 04/27/2023  Pt will decrease worst pain as reported on NPRS by at least 3 points in order to demonstrate clinically significant reduction in pain. Baseline: EVAL- 7/10 at worst Right shoulder and left elbow Goal status: INITIAL  2.  Pt will improve FOTO to target score of  to display perceived improvements in ability to complete ADL's.  Baseline: EVAL- Will add value of goal and complete survey next visit. Goal status: INITIAL  3.  Patient will report sleeping through the night without walking up due to pain. Baseline: EVAL- Difficulty sleeping through the night due to right shoulder pain. Goal status: INITIAL  4.  Patient will resume performing all ADL's, chores including housework without any restriction or pain Baseline: EVAL- Patient unable to perform several push/pull activities and chores due to pain. Goal status: INITIAL  5.  Patient will demonstrate performing reaching overhead and across body motions without restriction or report of pain.  Baseline: EVAL- Patient with difficulty with sharp Right shoulder pain with reaching and overhead movements. Goal status: INITIAL   PLAN:  PT  FREQUENCY: 1-2x/week  PT DURATION: 12 weeks  PLANNED INTERVENTIONS: 97164- PT Re-evaluation, 97110-Therapeutic exercises, 97530- Therapeutic activity, 97112- Neuromuscular re-education, 97535- Self Care, 14782- Manual therapy, [id number]- Electrical stimulation (manual), [id number]- Ionotophoresis 4mg /ml Dexamethasone, Patient/Family education, Taping, Dry Needling, Joint mobilization, Joint manipulation, Spinal manipulation, Spinal mobilization, Compression bandaging, Cryotherapy, and Moist heat  PLAN FOR NEXT SESSION: Continue    Lenda Kelp, PT 02/03/2023, 9:32 AM

## 2023-02-04 ENCOUNTER — Ambulatory Visit: Payer: Medicare PPO

## 2023-02-04 DIAGNOSIS — G8929 Other chronic pain: Secondary | ICD-10-CM | POA: Diagnosis not present

## 2023-02-04 DIAGNOSIS — M25511 Pain in right shoulder: Secondary | ICD-10-CM | POA: Diagnosis not present

## 2023-02-04 DIAGNOSIS — G2589 Other specified extrapyramidal and movement disorders: Secondary | ICD-10-CM | POA: Diagnosis not present

## 2023-02-04 DIAGNOSIS — M7702 Medial epicondylitis, left elbow: Secondary | ICD-10-CM

## 2023-02-04 DIAGNOSIS — M6281 Muscle weakness (generalized): Secondary | ICD-10-CM | POA: Diagnosis not present

## 2023-02-04 DIAGNOSIS — M7541 Impingement syndrome of right shoulder: Secondary | ICD-10-CM | POA: Diagnosis not present

## 2023-02-04 NOTE — Therapy (Signed)
OUTPATIENT PHYSICAL THERAPY RIGHT SHOULDER  and LEFT ELBOW TREATMENT   Patient Name: Jacqueline Meza MRN: [id number] DOB:03-Apr-1945, 78 y.o., female Today's Date: 02/05/2023  END OF SESSION:  PT End of Session - 02/04/23 1402     Visit Number 2    Number of Visits 24    Date for PT Re-Evaluation 03/17/23    PT Start Time 1402    PT Stop Time 1443    PT Time Calculation (min) 41 min    Activity Tolerance Patient tolerated treatment well;No increased pain    Behavior During Therapy [facility] for tasks assessed/performed             Past Medical History:  Diagnosis Date   Actinic keratosis 12/12/2007   R leg - bx proven    Basal cell carcinoma 01/04/2007   L upper back - superficial    Basal cell carcinoma 11/08/2013   L ant scalp    Basal cell carcinoma 12/10/2014   L nasal tip    Basal cell carcinoma 06/30/2021   left nasal tip, schedule mohs   Dysplastic nevus 01/23/2008   R ant ankle - moderate   Dysplastic nevus 11/08/2012   R prox dorsum of foot - mild    Endometriosis    Hyperlipidemia    Migraines    since childhood   Raynaud's phenomenon    Past Surgical History:  Procedure Laterality Date   ABDOMINAL HYSTERECTOMY  01/12/1993   endometriosis and worsening migraines   CESAREAN SECTION  1988   COLONOSCOPY  2005   Dr Lemar Livings   DILATION AND CURETTAGE OF UTERUS     done after miscarriages twice   MOHS SURGERY     Left Side of Nose   Patient Active Problem List   Diagnosis Date Noted   Sleep disturbance 01/28/2022   Advance directive discussed with patient 06/04/2014   Hyperlipidemia    Routine general medical examination at a health care facility 04/11/2010   Chronic migraine without aura without status migrainosus, not intractable 10/27/2007    PCP: Dr. Tillman Abide  REFERRING PROVIDER: Dr. Karleen Hampshire Copland  REFERRING DIAG:  M77.02 (ICD-10-CM) - Medial epicondylitis of elbow, left  M75.41 (ICD-10-CM) - Impingement syndrome of right shoulder   G25.89 (ICD-10-CM) - Scapular dyskinesis    THERAPY DIAG:  Medial epicondylitis, left elbow  Chronic right shoulder pain  Muscle weakness (generalized)  Rationale for Evaluation and Treatment: Rehabilitation  ONSET DATE: > 6 months  SUBJECTIVE:  SUBJECTIVE STATEMENT: I tried the ice and it did help. Rates left elbow as "intermittent" 4/10 and feeling worse than the shoulder Hand dominance: Right  PERTINENT HISTORY: Per Dr. Patsy Lager note from 01/17/2022: Jacqueline Meza is a 78 y.o. very pleasant female patient with Body mass index is 21.2 kg/m. who presents with the following:   F/u R shoulder - saw several times in 2024 and was doing ok with home rehab, and had a shoulder injection in the past.    At this point, she has been having worsening pain over the last 2 months, and she has pain in the right shoulder as well as in the posterior aspect of the shoulder.  She does not have any new or focal changes to neck pain.  She does not have any radicular signs or symptoms.  She does have some pain with terminal abduction and rotational maneuvers she has had a shoulder injection.  She also has been doing some home rehab for her shoulder.   Newer development of left-sided medial elbow pain at the medial epicondyle.  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    PAIN:  Are you having pain? Yes: NPRS scale: 0/10 at rest; 7/10 with some active ROM; best-0/10 Pain location: Right shoulder and left Elbow Pain description: sharp with movement and otherwise achy Aggravating factors: Reaching with RUE- pulling hurts left elbow Relieving factors: Tylenol/voltaren, Stretching  PRECAUTIONS: None  RED FLAGS:    WEIGHT BEARING RESTRICTIONS:  No  FALLS:  Has patient fallen in last 6 months? No  LIVING ENVIRONMENT: Lives with: lives alone Lives in: House/apartment Stairs: Yes: Internal: 17 steps; on left going up Has following equipment at home: None  OCCUPATION: Retired   PLOF: Independent  PATIENT GOALS:Be able to sleep without pain  NEXT MD VISIT:   OBJECTIVE:  Note: Objective measures were completed at Evaluation unless otherwise noted.  DIAGNOSTIC FINDINGS:  CLINICAL DATA:  Six-month right shoulder pain.   EXAM: RIGHT SHOULDER - 2+ VIEW   COMPARISON:  None available   FINDINGS: Moderate acromioclavicular joint space narrowing and peripheral osteophytosis. Minimal inferior glenoid degenerative spurring. The glenohumeral joint space is maintained. No acute fracture or dislocation. The visualized portion of the right lung is unremarkable.   IMPRESSION: Moderate acromioclavicular osteoarthritis.     Electronically Signed   By: Neita Garnet M.D.   On: 04/18/2022 14:48  PATIENT SURVEYS:  FOTO To be performed next visit  COGNITION: Overall cognitive status: Within functional limits for tasks assessed     SENSATION: WFL  POSTURE: Forward head and rounded shoulders  UPPER EXTREMITY ROM:   Active ROM Right eval Left eval  Shoulder flexion 173   Shoulder extension    Shoulder abduction 182   Shoulder adduction    Shoulder internal rotation    Shoulder external rotation 82   Elbow flexion    Elbow extension    Wrist flexion    Wrist extension    Wrist ulnar deviation    Wrist radial deviation    Wrist pronation    Wrist supination    (Blank rows = not tested)  UPPER EXTREMITY MMT:  MMT Right eval Left eval  Shoulder flexion 4 5  Shoulder extension 5 5  Shoulder abduction 4 5  Shoulder adduction 5 5  Shoulder internal rotation 5 5  Shoulder external rotation 4+ 5  Middle trapezius    Lower trapezius  Elbow flexion 5 5  Elbow extension 5 5  Wrist flexion 5 4+  Wrist  extension 5 5  Wrist ulnar deviation 5 5  Wrist radial deviation 5 5  Wrist pronation 5 5  Wrist supination 5 5  Grip strength (lbs) 47 42  (Blank rows = not tested)  SHOULDER SPECIAL TESTS: Impingement tests: Neer impingement test: positive  SLAP lesions: Crank test: negative Instability tests: Sulcus sign: negative Rotator cuff assessment: Empty can test: negative and Full can test: negative Biceps assessment: Yergason's test: negative and Speed's test: negative  JOINT MOBILITY TESTING:  Hypomobile GH inf glide  PALPATION:  (+) tenderness along anterior shoulder and AC joint (+) tenderness along medical epicondyle region of left elbow                                                                                                                             TREATMENT DATE:   Manual:  STM to left forearm- wrist flexors origin near medial epicondyle PROM left wrist flex - hold 30 sec x 4  Therex:  Instruction in PROM left wrist flex Instruction in use of yellow putty- grip and key pinch *See HEP section for specifics  Trigger Point Dry Needling  Initial Treatment: Pt instructed on Dry Needling rational, procedures, and possible side effects. Pt instructed to expect mild to moderate muscle soreness later in the day and/or into the next day.  Pt instructed in methods to reduce muscle soreness. Pt instructed to continue prescribed HEP. Patient was educated on signs and symptoms of infection and other risk factors and advised to seek medical attention should they occur.  Patient verbalized understanding of these instructions and education.   Patient Verbal Consent Given: Yes Education Handout Provided: Yes Muscles Treated: Left flexor carpi ulnaris and other wrist flexors- using 0.25 x4  Electrical Stimulation Performed: No Treatment Response/Outcome: Patient experienced a local twitch response with 2nd needle and no adverse reaction immediately following treatment.      PATIENT EDUCATION: Education details: PT plan of care, Instruction in symtoms and treatment of medical epicondylitis and shoulder impingement.  Person educated: Patient Education method: Explanation, Demonstration, and Verbal cues Education comprehension: verbalized understanding and returned demonstration  HOME EXERCISE PROGRAM: Access Code: [email or web address] URL: [email or web address] Date: 02/04/2023 Prepared by: Maureen Ralphs  Exercises - Seated Wrist Extension with Overpressure  - 1 x daily - 3 sets - 20-30 hold - Putty Squeezes  - 1 x daily - 3 sets - 10 reps - Key Pinch with Putty  - 1 x daily - 7 x weekly - 3 sets - 10 reps  ASSESSMENT:  CLINICAL IMPRESSION: Patient presented with good motivation for 2nd visit today. She was responsive to manual therapy and tolerated STM well and reported feeling better. She was was agreeable to DN and also responded well to treatment achieving 1 local twitch response and no pain. She was instructed in gentle forearm strengthening with use of putty and will  benefit from review next session.  She will benefit from skilled PT services to improve her pain, functional ROM, and overall UE strength to enable her to improve sleep and return to PLOF with less pain.   OBJECTIVE IMPAIRMENTS: decreased ROM, decreased strength, hypomobility, impaired UE functional use, and pain.   ACTIVITY LIMITATIONS: carrying and lifting  PARTICIPATION LIMITATIONS: cleaning, laundry, driving, shopping, community activity, and yard work  PERSONAL FACTORS: 1 comorbidity: arthritis  are also affecting patient's functional outcome.   REHAB POTENTIAL: Good  CLINICAL DECISION MAKING: Evolving/moderate complexity  EVALUATION COMPLEXITY: Moderate   GOALS: Goals reviewed with patient? Yes  SHORT TERM GOALS: Target date: 03/17/2023  Pt will be independent with HEP in order to improve strength and balance in order to decrease fall risk and improve function at  home and work.  Baseline: EVAL- Patient with no formal HEP Goal status: INITIAL  LONG TERM GOALS: Target date: 04/27/2023  Pt will decrease worst pain as reported on NPRS by at least 3 points in order to demonstrate clinically significant reduction in pain. Baseline: EVAL- 7/10 at worst Right shoulder and left elbow Goal status: INITIAL  2.  Pt will improve FOTO to target score of  to display perceived improvements in ability to complete ADL's.  Baseline: EVAL- Will add value of goal and complete survey next visit. Goal status: INITIAL  3.  Patient will report sleeping through the night without walking up due to pain. Baseline: EVAL- Difficulty sleeping through the night due to right shoulder pain. Goal status: INITIAL  4.  Patient will resume performing all ADL's, chores including housework without any restriction or pain Baseline: EVAL- Patient unable to perform several push/pull activities and chores due to pain. Goal status: INITIAL  5.  Patient will demonstrate performing reaching overhead and across body motions without restriction or report of pain.  Baseline: EVAL- Patient with difficulty with sharp Right shoulder pain with reaching and overhead movements. Goal status: INITIAL   PLAN:  PT FREQUENCY: 1-2x/week  PT DURATION: 12 weeks  PLANNED INTERVENTIONS: 97164- PT Re-evaluation, 97110-Therapeutic exercises, 97530- Therapeutic activity, 97112- Neuromuscular re-education, 97535- Self Care, 46962- Manual therapy, [id number]- Electrical stimulation (manual), [phone]- Ionotophoresis 4mg /ml Dexamethasone, Patient/Family education, Taping, Dry Needling, Joint mobilization, Joint manipulation, Spinal manipulation, Spinal mobilization, Compression bandaging, Cryotherapy, and Moist heat  PLAN FOR NEXT SESSION: Continue with DN as appropriate; Manual therapy for pain relief and optimal ROM, Wrist strengthening as appropriate.    Lenda Kelp, PT 02/05/2023, 8:39 PM

## 2023-02-09 ENCOUNTER — Ambulatory Visit: Payer: Medicare PPO

## 2023-02-09 DIAGNOSIS — M7541 Impingement syndrome of right shoulder: Secondary | ICD-10-CM | POA: Diagnosis not present

## 2023-02-09 DIAGNOSIS — M7702 Medial epicondylitis, left elbow: Secondary | ICD-10-CM

## 2023-02-09 DIAGNOSIS — M25511 Pain in right shoulder: Secondary | ICD-10-CM | POA: Diagnosis not present

## 2023-02-09 DIAGNOSIS — G2589 Other specified extrapyramidal and movement disorders: Secondary | ICD-10-CM | POA: Diagnosis not present

## 2023-02-09 DIAGNOSIS — G8929 Other chronic pain: Secondary | ICD-10-CM

## 2023-02-09 DIAGNOSIS — M6281 Muscle weakness (generalized): Secondary | ICD-10-CM

## 2023-02-09 NOTE — Therapy (Signed)
OUTPATIENT PHYSICAL THERAPY RIGHT SHOULDER  and LEFT ELBOW TREATMENT   Patient Name: Jacqueline Meza MRN: [id number] DOB:1945-05-01, 78 y.o., female Today's Date: 02/09/2023  END OF SESSION:  PT End of Session - 02/09/23 1400     Visit Number 3    Number of Visits 24    Date for PT Re-Evaluation 03/17/23    PT Start Time 1401    PT Stop Time 1434    PT Time Calculation (min) 33 min    Activity Tolerance Patient tolerated treatment well;No increased pain    Behavior During Therapy [facility] for tasks assessed/performed             Past Medical History:  Diagnosis Date   Actinic keratosis 12/12/2007   R leg - bx proven    Basal cell carcinoma 01/04/2007   L upper back - superficial    Basal cell carcinoma 11/08/2013   L ant scalp    Basal cell carcinoma 12/10/2014   L nasal tip    Basal cell carcinoma 06/30/2021   left nasal tip, schedule mohs   Dysplastic nevus 01/23/2008   R ant ankle - moderate   Dysplastic nevus 11/08/2012   R prox dorsum of foot - mild    Endometriosis    Hyperlipidemia    Migraines    since childhood   Raynaud's phenomenon    Past Surgical History:  Procedure Laterality Date   ABDOMINAL HYSTERECTOMY  01/12/1993   endometriosis and worsening migraines   CESAREAN SECTION  1988   COLONOSCOPY  2005   Dr Lemar Livings   DILATION AND CURETTAGE OF UTERUS     done after miscarriages twice   MOHS SURGERY     Left Side of Nose   Patient Active Problem List   Diagnosis Date Noted   Sleep disturbance 01/28/2022   Advance directive discussed with patient 06/04/2014   Hyperlipidemia    Routine general medical examination at a health care facility 04/11/2010   Chronic migraine without aura without status migrainosus, not intractable 10/27/2007    PCP: Dr. Tillman Abide  REFERRING PROVIDER: Dr. Karleen Hampshire Copland  REFERRING DIAG:  M77.02 (ICD-10-CM) - Medial epicondylitis of elbow, left  M75.41 (ICD-10-CM) - Impingement syndrome of right shoulder   G25.89 (ICD-10-CM) - Scapular dyskinesis    THERAPY DIAG:  Medial epicondylitis, left elbow  Chronic right shoulder pain  Muscle weakness (generalized)  Rationale for Evaluation and Treatment: Rehabilitation  ONSET DATE: > 6 months  SUBJECTIVE:  SUBJECTIVE STATEMENT: Patient reports improving pain overall- "felt great after last visit but I was helping my sister- lifting boxes and aggravated my arm." Overall still better - rates at a 2/10 today.  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 Patsy Lager note from 01/17/2022: Jacqueline Meza is a 78 y.o. very pleasant female patient with Body mass index is 21.2 kg/m. who presents with the following:   F/u R shoulder - saw several times in 2024 and was doing ok with home rehab, and had a shoulder injection in the past.    At this point, she has been having worsening pain over the last 2 months, and she has pain in the right shoulder as well as in the posterior aspect of the shoulder.  She does not have any new or focal changes to neck pain.  She does not have any radicular signs or symptoms.  She does have some pain with terminal abduction and rotational maneuvers she has had a shoulder injection.  She also has been doing some home rehab for her shoulder.   Newer development of left-sided medial elbow pain at the medial epicondyle.  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    PAIN:  Are you having pain? Yes: NPRS scale: 0/10 at rest; 7/10 with some active ROM; best-0/10 Pain location: Right shoulder and left Elbow Pain description: sharp with movement and otherwise  achy Aggravating factors: Reaching with RUE- pulling hurts left elbow Relieving factors: Tylenol/voltaren, Stretching  PRECAUTIONS: None  RED FLAGS:    WEIGHT BEARING RESTRICTIONS: No  FALLS:  Has patient fallen in last 6 months? No  LIVING ENVIRONMENT: Lives with: lives alone Lives in: House/apartment Stairs: Yes: Internal: 17 steps; on left going up Has following equipment at home: None  OCCUPATION: Retired   PLOF: Independent  PATIENT GOALS:Be able to sleep without pain  NEXT MD VISIT:   OBJECTIVE:  Note: Objective measures were completed at Evaluation unless otherwise noted.  DIAGNOSTIC FINDINGS:  CLINICAL DATA:  Six-month right shoulder pain.   EXAM: RIGHT SHOULDER - 2+ VIEW   COMPARISON:  None available   FINDINGS: Moderate acromioclavicular joint space narrowing and peripheral osteophytosis. Minimal inferior glenoid degenerative spurring. The glenohumeral joint space is maintained. No acute fracture or dislocation. The visualized portion of the right lung is unremarkable.   IMPRESSION: Moderate acromioclavicular osteoarthritis.     Electronically Signed   By: Neita Garnet M.D.   On: 04/18/2022 14:48  PATIENT SURVEYS:  FOTO To be performed next visit  COGNITION: Overall cognitive status: Within functional limits for tasks assessed     SENSATION: WFL  POSTURE: Forward head and rounded shoulders  UPPER EXTREMITY ROM:   Active ROM Right eval Left eval  Shoulder flexion 173   Shoulder extension    Shoulder abduction 182   Shoulder adduction    Shoulder internal rotation    Shoulder external rotation 82   Elbow flexion    Elbow extension    Wrist flexion    Wrist extension    Wrist ulnar deviation    Wrist radial deviation    Wrist pronation    Wrist supination    (Blank rows = not tested)  UPPER EXTREMITY MMT:  MMT Right eval Left eval  Shoulder flexion 4  5  Shoulder extension 5 5  Shoulder abduction 4 5  Shoulder  adduction 5 5  Shoulder internal rotation 5 5  Shoulder external rotation 4+ 5  Middle trapezius    Lower trapezius    Elbow flexion 5 5  Elbow extension 5 5  Wrist flexion 5 4+  Wrist extension 5 5  Wrist ulnar deviation 5 5  Wrist radial deviation 5 5  Wrist pronation 5 5  Wrist supination 5 5  Grip strength (lbs) 47 42  (Blank rows = not tested)  SHOULDER SPECIAL TESTS: Impingement tests: Neer impingement test: positive  SLAP lesions: Crank test: negative Instability tests: Sulcus sign: negative Rotator cuff assessment: Empty can test: negative and Full can test: negative Biceps assessment: Yergason's test: negative and Speed's test: negative  JOINT MOBILITY TESTING:  Hypomobile GH inf glide  PALPATION:  (+) tenderness along anterior shoulder and AC joint (+) tenderness along medical epicondyle region of left elbow                                                                                                                             TREATMENT DATE:   Manual:  STM to left forearm- wrist flexors origin near medial epicondyle Cross friction massage to left wrist flex near  PROM left wrist flex - hold 30 sec x 4  Therex:   Review of  PROM left wrist flex in sitting- hold 30 sec x 3  Light resistive left Wrist flex 2# 2 sets of 10 reps Light resistive Left wrist flex with ulnar deviation  AROM left wrist  flex with 3rd digit flex x 10  Review of yellow putty- grip and key pinch techniques- emphasized "no pain" when performing 2 sets of 10 reps each.    Trigger Point Dry Needling  Subsequent Treatment: Instructions provided previously at initial dry needling treatment.   Patient Verbal Consent Given: Yes Education Handout Provided: Previously Provided Muscles Treated: Left wrist flexor carpi ulnaris using 0.25 x 40 Electrical Stimulation Performed: No Treatment Response/Outcome: 1 local twitch response immediately upon insertion. No adverse reaction.     Unbilled Ice cup massage at end of visit x 2 min to left medial elbow epicondyle region. Patient reported her elbow region feeling Cold and numb immediately after ice treatment.   PATIENT EDUCATION: Education details: PT plan of care, Instruction in symtoms and treatment of medical epicondylitis and shoulder impingement.  Person educated: Patient Education method: Explanation, Demonstration, and Verbal cues Education comprehension: verbalized understanding and returned demonstration  HOME EXERCISE PROGRAM: Access Code: [email or web address] URL: [email or web address] Date: 02/04/2023 Prepared by: Maureen Ralphs  Exercises - Seated Wrist Extension with Overpressure  - 1 x daily - 3 sets - 20-30 hold - Putty Squeezes  - 1 x daily - 3 sets - 10 reps - Key Pinch with Putty  - 1 x daily - 7 x weekly - 3 sets - 10 reps  ASSESSMENT:  CLINICAL IMPRESSION: Patient continues to  present with excellent motivation for treatment. She is progressing with pain with treatment. Reviewed previously issued HEP- and patient able to use putty with minimal instruction and good understanding of wrist stretching/strengthening. She is responding well to pain relieving strategies- Dry needling and ice and stated no pain at end of session. She was also able to progress to some light resistive wrist flexor strengthening without report of pain. She will benefit from skilled PT services to improve her pain, functional ROM, and overall UE strength to enable her to improve sleep and return to PLOF with less pain.   OBJECTIVE IMPAIRMENTS: decreased ROM, decreased strength, hypomobility, impaired UE functional use, and pain.   ACTIVITY LIMITATIONS: carrying and lifting  PARTICIPATION LIMITATIONS: cleaning, laundry, driving, shopping, community activity, and yard work  PERSONAL FACTORS: 1 comorbidity: arthritis  are also affecting patient's functional outcome.   REHAB POTENTIAL: Good  CLINICAL DECISION MAKING:  Evolving/moderate complexity  EVALUATION COMPLEXITY: Moderate   GOALS: Goals reviewed with patient? Yes  SHORT TERM GOALS: Target date: 03/17/2023  Pt will be independent with HEP in order to improve strength and balance in order to decrease fall risk and improve function at home and work.  Baseline: EVAL- Patient with no formal HEP Goal status: INITIAL  LONG TERM GOALS: Target date: 04/27/2023  Pt will decrease worst pain as reported on NPRS by at least 3 points in order to demonstrate clinically significant reduction in pain. Baseline: EVAL- 7/10 at worst Right shoulder and left elbow Goal status: INITIAL  2.  Pt will improve FOTO to target score of  to display perceived improvements in ability to complete ADL's.  Baseline: EVAL- Will add value of goal and complete survey next visit. Goal status: INITIAL  3.  Patient will report sleeping through the night without walking up due to pain. Baseline: EVAL- Difficulty sleeping through the night due to right shoulder pain. Goal status: INITIAL  4.  Patient will resume performing all ADL's, chores including housework without any restriction or pain Baseline: EVAL- Patient unable to perform several push/pull activities and chores due to pain. Goal status: INITIAL  5.  Patient will demonstrate performing reaching overhead and across body motions without restriction or report of pain.  Baseline: EVAL- Patient with difficulty with sharp Right shoulder pain with reaching and overhead movements. Goal status: INITIAL   PLAN:  PT FREQUENCY: 1-2x/week  PT DURATION: 12 weeks  PLANNED INTERVENTIONS: 97164- PT Re-evaluation, 97110-Therapeutic exercises, 97530- Therapeutic activity, 97112- Neuromuscular re-education, 97535- Self Care, 96295- Manual therapy, [id number]- Electrical stimulation (manual), [phone]- Ionotophoresis 4mg /ml Dexamethasone, Patient/Family education, Taping, Dry Needling, Joint mobilization, Joint manipulation, Spinal  manipulation, Spinal mobilization, Compression bandaging, Cryotherapy, and Moist heat  PLAN FOR NEXT SESSION: Continue with DN as appropriate; Manual therapy for pain relief and optimal ROM, Wrist strengthening as appropriate.    Lenda Kelp, PT 02/09/2023, 2:50 PM

## 2023-02-11 ENCOUNTER — Ambulatory Visit: Payer: Medicare PPO

## 2023-02-11 ENCOUNTER — Ambulatory Visit: Payer: Medicare PPO | Admitting: Neurology

## 2023-02-11 ENCOUNTER — Ambulatory Visit: Payer: Medicare PPO | Admitting: Physical Therapy

## 2023-02-11 DIAGNOSIS — E538 Deficiency of other specified B group vitamins: Secondary | ICD-10-CM

## 2023-02-11 DIAGNOSIS — Z820 Family history of epilepsy and other diseases of the nervous system: Secondary | ICD-10-CM | POA: Diagnosis not present

## 2023-02-11 DIAGNOSIS — R5383 Other fatigue: Secondary | ICD-10-CM

## 2023-02-11 DIAGNOSIS — G43709 Chronic migraine without aura, not intractable, without status migrainosus: Secondary | ICD-10-CM | POA: Diagnosis not present

## 2023-02-11 DIAGNOSIS — E519 Thiamine deficiency, unspecified: Secondary | ICD-10-CM

## 2023-02-11 MED ORDER — ONABOTULINUMTOXINA 200 UNITS IJ SOLR
155.0000 [IU] | Freq: Once | INTRAMUSCULAR | Status: AC
  Administered 2023-02-11: 155 [IU] via INTRAMUSCULAR

## 2023-02-11 NOTE — Progress Notes (Signed)
Botox- 200 units x 1 vial Lot: [id number] Expiration: 04/2025 NDC: 8413-2440-10  Bacteriostatic 0.9% Sodium Chloride- 4 mL  Lot: [id number] Expiration: 11/13/2023 NDC: 6440-3474-25  Dx: Z56.387 B/B Witnessed by Truitt Leep RN

## 2023-02-11 NOTE — Progress Notes (Signed)
Patient's sister has alzheimer's disease, twin sister. She is worried about the same understandably. But does not feel her memory is impaires, not forgetting names, nor getting lost, driving, independent, managing money, performing all IADLs and ADLs.   At this time she is asymptomatic so I would not test for amyloid, or perform pet cnning for amyloid but I would get a baseline memory tet given the severity of her identical sister's alzheimers. If we have a detailed memory formal test we can use it as a baseline. And if infact there is MCI likely of the alzheimers type we can discuss further testing  MRI of the brain 2021;  FINDINGS:    No abnormal lesions are seen on diffusion-weighted views to suggest acute ischemia. The cortical sulci, fissures and cisterns are normal in size and appearance. Lateral, third and fourth ventricle are normal in size and appearance. No extra-axial fluid collections are seen. No evidence of mass effect or midline shift.  Few non-specific punctate foci of periventricular and subcortical gliosis.  No abnormal lesions are seen on post contrast views.     On sagittal views the posterior fossa, pituitary gland and corpus callosum are unremarkable. No evidence of intracranial hemorrhage on SWI views. The orbits and their contents, paranasal sinuses and calvarium are unremarkable.  Intracranial flow voids are present.       IMPRESSION:    Unremarkable MRI brain (with and without). No acute findings  Orders Placed This Encounter  Procedures   B12 and Folate Panel   Methylmalonic acid, serum   TSH Rfx on Abnormal to Free T4   Vitamin B1   Vitamin D, 25-hydroxy   Ambulatory referral to Neuropsychology   I spent over 20 minutes of face-to-face and non-face-to-face time with patient on the  1. Chronic migraine without aura without status migrainosus, not intractable   2. B12 deficiency   3. Other fatigue   4. Evaluate for Vitamin B1 deficiency   5. FHx: Alzheimer's  disease    diagnosis.  This included previsit chart review, lab review, study review, order entry, electronic health record documentation, patient education on the different diagnostic and therapeutic options, counseling and coordination of care, risks and benefits of management, compliance, or risk factor reduction. This does not include time spent on botox procedure

## 2023-02-11 NOTE — Progress Notes (Signed)
Consent Form Botulism Toxin Injection For Chronic Migraine  02/11/2023: Patient feels that her migraines cause her jaw to ache in her jaw aching also can cause her migraines to worsen it is a trigger for migraines included 10 units in each masseter to see if that helps with migraine severity.  11/10/2023: Jacqueline Meza performed 07/06/2022: stable, doing great 04/13/2022: Decreased topiramate to 100mg  qhs was bid. 2 migraines last 3 months, sumatriptan works. Emgalityalso working.  01/19/2022; Stable, Still excellent response >> 76% improvement freq and severity. 10/19/2021: doing well still, stable 07/30/2021: stable 05/05/2021: stable 01/28/2021: hasn't had one migraine or headache tremendous improvement.   10/29/2020:  Still excellent response >> 76% improvement freq and severity. She has two 76 year old twin grandchildren, a boy and a girl, kindergarten.   Reviewed orally with patient, additionally signature is on file:  Botulism toxin has been approved by the Federal drug administration for treatment of chronic migraine. Botulism toxin does not cure chronic migraine and it may not be effective in some patients.  The administration of botulism toxin is accomplished by injecting a small amount of toxin into the muscles of the neck and head. Dosage must be titrated for each individual. Any benefits resulting from botulism toxin tend to wear off after 3 months with a repeat injection required if benefit is to be maintained. Injections are usually done every 3-4 months with maximum effect peak achieved by about 2 or 3 weeks. Botulism toxin is expensive and you should be sure of what costs you will incur resulting from the injection.  The side effects of botulism toxin use for chronic migraine may include:   -Transient, and usually mild, facial weakness with facial injections  -Transient, and usually mild, head or neck weakness with head/neck injections  -Reduction or loss of forehead facial animation  due to forehead muscle weakness  -Eyelid drooping  -Dry eye  -Pain at the site of injection or bruising at the site of injection  -Double vision  -Potential unknown long term risks  Contraindications: You should not have Botox if you are pregnant, nursing, allergic to albumin, have an infection, skin condition, or muscle weakness at the site of the injection, or have myasthenia gravis, Lambert-Eaton syndrome, or ALS.  It is also possible that as with any injection, there may be an allergic reaction or no effect from the medication. Reduced effectiveness after repeated injections is sometimes seen and rarely infection at the injection site may occur. All care will be taken to prevent these side effects. If therapy is given over a long time, atrophy and wasting in the muscle injected may occur. Occasionally the patient's become refractory to treatment because they develop antibodies to the toxin. In this event, therapy needs to be modified.  I have read the above information and consent to the administration of botulism toxin.    BOTOX PROCEDURE NOTE FOR MIGRAINE HEADACHE    Contraindications and precautions discussed with patient(above). Aseptic procedure was observed and patient tolerated procedure. Procedure performed by Dr. Artemio Aly  The condition has existed for more than 6 months, and pt does not have a diagnosis of ALS, Myasthenia Gravis or Lambert-Eaton Syndrome.  Risks and benefits of injections discussed and pt agrees to proceed with the procedure.  Written consent obtained  These injections are medically necessary. Pt  receives good benefits from these injections. These injections do not cause sedations or hallucinations which the oral therapies may cause.  Description of procedure:  The patient was placed in  a sitting position. The standard protocol was used for Botox as follows, with 5 units of Botox injected at each site:   -Procerus muscle, midline  injection  -Corrugator muscle, bilateral injection  -Frontalis muscle, bilateral injection, with 2 sites each side, medial injection was performed in the upper one third of the frontalis muscle, in the region vertical from the medial inferior edge of the superior orbital rim. The lateral injection was again in the upper one third of the forehead vertically above the lateral limbus of the cornea, 1.5 cm lateral to the medial injection site.  -Temporalis muscle injection, 4 sites, bilaterally. The first injection was 3 cm above the tragus of the ear, second injection site was 1.5 cm to 3 cm up from the first injection site in line with the tragus of the ear. The third injection site was 1.5-3 cm forward between the first 2 injection sites. The fourth injection site was 1.5 cm posterior to the second injection site.   -Occipitalis muscle injection, 3 sites, bilaterally. The first injection was done one half way between the occipital protuberance and the tip of the mastoid process behind the ear. The second injection site was done lateral and superior to the first, 1 fingerbreadth from the first injection. The third injection site was 1 fingerbreadth superiorly and medially from the first injection site.  -Cervical paraspinal muscle injection, 2 sites, bilateral knee first injection site was 1 cm from the midline of the cervical spine, 3 cm inferior to the lower border of the occipital protuberance. The second injection site was 1.5 cm superiorly and laterally to the first injection site.  -Trapezius muscle injection was performed at 3 sites, bilaterally. The first injection site was in the upper trapezius muscle halfway between the inflection point of the neck, and the acromion. The second injection site was one half way between the acromion and the first injection site. The third injection was done between the first injection site and the inflection point of the neck.   Will return for repeat injection  in 3 months.   155 units of Botox was used, 45u Botox not injected was wasted. The patient tolerated the procedure well, there were no complications of the above procedure.

## 2023-02-12 ENCOUNTER — Encounter: Payer: Self-pay | Admitting: Neurology

## 2023-02-15 ENCOUNTER — Telehealth: Payer: Self-pay | Admitting: Neurology

## 2023-02-15 ENCOUNTER — Encounter: Payer: Self-pay | Admitting: Neurology

## 2023-02-15 NOTE — Addendum Note (Signed)
Addended by: Bertram Savin on: 02/15/2023 05:36 PM   Modules accepted: Orders

## 2023-02-15 NOTE — Telephone Encounter (Signed)
Referral neuropsychology fax to [facility]. Phone: [phone], Fax: [phone]

## 2023-02-16 ENCOUNTER — Ambulatory Visit: Payer: Medicare PPO | Attending: Obstetrics and Gynecology | Admitting: Physical Therapy

## 2023-02-16 ENCOUNTER — Encounter: Payer: Medicare PPO | Admitting: Physical Therapy

## 2023-02-16 DIAGNOSIS — M6281 Muscle weakness (generalized): Secondary | ICD-10-CM | POA: Insufficient documentation

## 2023-02-16 DIAGNOSIS — G8929 Other chronic pain: Secondary | ICD-10-CM | POA: Insufficient documentation

## 2023-02-16 DIAGNOSIS — M7702 Medial epicondylitis, left elbow: Secondary | ICD-10-CM | POA: Insufficient documentation

## 2023-02-16 DIAGNOSIS — M25511 Pain in right shoulder: Secondary | ICD-10-CM | POA: Diagnosis not present

## 2023-02-16 NOTE — Therapy (Signed)
 OUTPATIENT PHYSICAL THERAPY RIGHT SHOULDER  and LEFT ELBOW TREATMENT   Patient Name: CALLEN ZUBA MRN: [id number] DOB:[birth year], 78 y.o., female Today's Date: 02/16/2023  END OF SESSION:  PT End of Session - 02/16/23 1108     Visit Number 4    Number of Visits 24    Date for PT Re-Evaluation 03/17/23    PT Start Time 1105    PT Stop Time 1145    PT Time Calculation (min) 40 min    Activity Tolerance Patient tolerated treatment well;No increased pain    Behavior During Therapy [facility] for tasks assessed/performed             Past Medical History:  Diagnosis Date   Actinic keratosis 12/12/2007   R leg - bx proven    Basal cell carcinoma 01/04/2007   L upper back - superficial    Basal cell carcinoma 11/08/2013   L ant scalp    Basal cell carcinoma 12/10/2014   L nasal tip    Basal cell carcinoma 06/30/2021   left nasal tip, schedule mohs   Dysplastic nevus 01/23/2008   R ant ankle - moderate   Dysplastic nevus 11/08/2012   R prox dorsum of foot - mild    Endometriosis    Hyperlipidemia    Migraines    since childhood   Raynaud's phenomenon    Past Surgical History:  Procedure Laterality Date   ABDOMINAL HYSTERECTOMY  01/12/1993   endometriosis and worsening migraines   CESAREAN SECTION  1988   COLONOSCOPY  2005   Dr Dessa   DILATION AND CURETTAGE OF UTERUS     done after miscarriages twice   MOHS SURGERY     Left Side of Nose   Patient Active Problem List   Diagnosis Date Noted   Sleep disturbance 01/28/2022   Advance directive discussed with patient 06/04/2014   Hyperlipidemia    Routine general medical examination at a health care facility 04/11/2010   Chronic migraine without aura without status migrainosus, not intractable 10/27/2007    PCP: Dr. Charlie Denise  REFERRING PROVIDER: Dr. Jacques Copland  REFERRING DIAG:  M77.02 (ICD-10-CM) - Medial epicondylitis of elbow, left  M75.41 (ICD-10-CM) - Impingement syndrome of right shoulder   G25.89 (ICD-10-CM) - Scapular dyskinesis    THERAPY DIAG:  Medial epicondylitis, left elbow  Muscle weakness (generalized)  Chronic right shoulder pain  Rationale for Evaluation and Treatment: Rehabilitation  ONSET DATE: > 6 months  SUBJECTIVE:  SUBJECTIVE STATEMENT:   Pt reports that shoulder is feeling better and only 2/10 pain  in the L elbow. States that it was a streddfull weekend. Sister had been in the ED with neuro virus .    Hand dominance: Right  PERTINENT HISTORY: Per Dr. Watt note from 01/17/2022: LLUVIA GWYNNE is a 78 y.o. very pleasant female patient with Body mass index is 21.2 kg/m. who presents with the following:   F/u R shoulder - saw several times in 2024 and was doing ok with home rehab, and had a shoulder injection in the past.    At this point, she has been having worsening pain over the last 2 months, and she has pain in the right shoulder as well as in the posterior aspect of the shoulder.  She does not have any new or focal changes to neck pain.  She does not have any radicular signs or symptoms.  She does have some pain with terminal abduction and rotational maneuvers she has had a shoulder injection.  She also has been doing some home rehab for her shoulder.   Newer development of left-sided medial elbow pain at the medial epicondyle.  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    PAIN:  Are you having pain? Yes: NPRS scale: 2/10 Pain location: Right shoulder and left Elbow Pain description: sharp with movement and otherwise achy Aggravating factors: Reaching with RUE- pulling hurts left elbow Relieving factors: Tylenol/voltaren , Stretching  PRECAUTIONS: None  RED FLAGS:    WEIGHT  BEARING RESTRICTIONS: No  FALLS:  Has patient fallen in last 6 months? No  LIVING ENVIRONMENT: Lives with: lives alone Lives in: House/apartment Stairs: Yes: Internal: 17 steps; on left going up Has following equipment at home: None  OCCUPATION: Retired   PLOF: Independent  PATIENT GOALS:Be able to sleep without pain  NEXT MD VISIT:   OBJECTIVE:  Note: Objective measures were completed at Evaluation unless otherwise noted.  DIAGNOSTIC FINDINGS:  CLINICAL DATA:  Six-month right shoulder pain.   EXAM: RIGHT SHOULDER - 2+ VIEW   COMPARISON:  None available   FINDINGS: Moderate acromioclavicular joint space narrowing and peripheral osteophytosis. Minimal inferior glenoid degenerative spurring. The glenohumeral joint space is maintained. No acute fracture or dislocation. The visualized portion of the right lung is unremarkable.   IMPRESSION: Moderate acromioclavicular osteoarthritis.     Electronically Signed   By: Tanda Lyons M.D.   On: 04/18/2022 14:48  PATIENT SURVEYS:  FOTO To be performed next visit  COGNITION: Overall cognitive status: Within functional limits for tasks assessed     SENSATION: WFL  POSTURE: Forward head and rounded shoulders  UPPER EXTREMITY ROM:   Active ROM Right eval Left eval  Shoulder flexion 173   Shoulder extension    Shoulder abduction 182   Shoulder adduction    Shoulder internal rotation    Shoulder external rotation 82   Elbow flexion    Elbow extension    Wrist flexion    Wrist extension    Wrist ulnar deviation    Wrist radial deviation    Wrist pronation    Wrist supination    (Blank rows = not tested)  UPPER EXTREMITY MMT:  MMT Right eval Left eval  Shoulder flexion 4 5  Shoulder extension 5 5  Shoulder abduction 4 5  Shoulder adduction 5 5  Shoulder internal rotation 5 5  Shoulder external rotation 4+  5  Middle trapezius    Lower trapezius    Elbow flexion 5 5  Elbow extension 5 5  Wrist  flexion 5 4+  Wrist extension 5 5  Wrist ulnar deviation 5 5  Wrist radial deviation 5 5  Wrist pronation 5 5  Wrist supination 5 5  Grip strength (lbs) 47 42  (Blank rows = not tested)  SHOULDER SPECIAL TESTS: Impingement tests: Neer impingement test: positive  SLAP lesions: Crank test: negative Instability tests: Sulcus sign: negative Rotator cuff assessment: Empty can test: negative and Full can test: negative Biceps assessment: Yergason's test: negative and Speed's test: negative  JOINT MOBILITY TESTING:  Hypomobile GH inf glide  PALPATION:  (+) tenderness along anterior shoulder and AC joint (+) tenderness along medical epicondyle region of left elbow                                                                                                                             TREATMENT DATE:   Manual:  STM to left forearm- wrist flexors origin near medial epicondyle and flexor carpi radialis and pronator x 8 minutes  Cross friction massage to left wrist flex near insertion of medial epicondyle  PROM left wrist flex - hold 30 sec x 4  Therex:  PROM left wrist flex in sitting- hold 30 sec x 3  Light resistive Left Wrist flex 2# 2 sets of 10 reps slow eccentrics  Light resistive Left wrist flex with ulnar deviation 2 x 12  Ulnar deviation with YTB 2 x 12 with slow eccentrics  AROM left wrist  flex with 3rd digit flex x 10  Review of yellow putty- grip and key pinch techniques- emphasized no pain when performing 2 sets of 12 reps each.    Trigger Point Dry Needling  Subsequent Treatment: Instructions provided previously at initial dry needling treatment.   Patient Verbal Consent Given: Yes Education Handout Provided: Previously Provided Muscles Treated: Left wrist flexor carpi radialis, and pronator teres using  0.16 x 40 Electrical Stimulation Performed: No Treatment Response/Outcome: 3 local twitch response immediately upon insertion. No adverse reaction.     Unbilled Ice cup massage at end of visit x 2 min to left medial elbow epicondyle region. Patient reported her elbow region feeling Cold and better immediately after treatment   PATIENT EDUCATION: Education details: , Instruction in symtoms and treatment of medical epicondylitis and shoulder impingement.  Person educated: Patient Education method: Explanation, Demonstration, and Verbal cues Education comprehension: verbalized understanding and returned demonstration  HOME EXERCISE PROGRAM: Access Code: [email or web address] URL: [email or web address] Date: 02/04/2023 Prepared by: Reyes London  Exercises - Seated Wrist Extension with Overpressure  - 1 x daily - 3 sets - 20-30 hold - Putty Squeezes  - 1 x daily - 3 sets - 10 reps - Key Pinch with Putty  - 1 x daily - 7 x weekly - 3 sets - 10 reps  ASSESSMENT:  CLINICAL IMPRESSION: Patient continues to  present with excellent motivation for treatment. She is progressing with pain with treatment. Continued to review HEP with emphasis on decreased speed in eccentrics. Tolerated TDN well with  3 local twitch response. She will benefit from skilled PT services to improve her pain, functional ROM, and overall UE strength to enable her to improve sleep and return to PLOF with less pain.   OBJECTIVE IMPAIRMENTS: decreased ROM, decreased strength, hypomobility, impaired UE functional use, and pain.   ACTIVITY LIMITATIONS: carrying and lifting  PARTICIPATION LIMITATIONS: cleaning, laundry, driving, shopping, community activity, and yard work  PERSONAL FACTORS: 1 comorbidity: arthritis  are also affecting patient's functional outcome.   REHAB POTENTIAL: Good  CLINICAL DECISION MAKING: Evolving/moderate complexity  EVALUATION COMPLEXITY: Moderate   GOALS: Goals reviewed with patient? Yes  SHORT TERM GOALS: Target date: 03/17/2023  Pt will be independent with HEP in order to improve strength and balance in order to decrease fall  risk and improve function at home and work.  Baseline: EVAL- Patient with no formal HEP Goal status: INITIAL  LONG TERM GOALS: Target date: 04/27/2023  Pt will decrease worst pain as reported on NPRS by at least 3 points in order to demonstrate clinically significant reduction in pain. Baseline: EVAL- 7/10 at worst Right shoulder and left elbow Goal status: INITIAL  2.  Pt will improve FOTO to target score of  to display perceived improvements in ability to complete ADL's.  Baseline: EVAL- Will add value of goal and complete survey next visit. Goal status: INITIAL  3.  Patient will report sleeping through the night without walking up due to pain. Baseline: EVAL- Difficulty sleeping through the night due to right shoulder pain. Goal status: INITIAL  4.  Patient will resume performing all ADL's, chores including housework without any restriction or pain Baseline: EVAL- Patient unable to perform several push/pull activities and chores due to pain. Goal status: INITIAL  5.  Patient will demonstrate performing reaching overhead and across body motions without restriction or report of pain.  Baseline: EVAL- Patient with difficulty with sharp Right shoulder pain with reaching and overhead movements. Goal status: INITIAL   PLAN:  PT FREQUENCY: 1-2x/week  PT DURATION: 12 weeks  PLANNED INTERVENTIONS: 97164- PT Re-evaluation, 97110-Therapeutic exercises, 97530- Therapeutic activity, 97112- Neuromuscular re-education, 97535- Self Care, 02859- Manual therapy, [id number]- Electrical stimulation (manual), [id number]- Ionotophoresis 4mg /ml Dexamethasone, Patient/Family education, Taping, Dry Needling, Joint mobilization, Joint manipulation, Spinal manipulation, Spinal mobilization, Compression bandaging, Cryotherapy, and Moist heat  PLAN FOR NEXT SESSION:  Continue with DN as appropriate; Manual therapy for pain relief and optimal ROM, Wrist strengthening as appropriate.    Massie FORBES Dollar,  PT 02/16/2023, 11:08 AM

## 2023-02-17 LAB — B12 AND FOLATE PANEL
Folate: >20 ng/mL (ref >3.0)
Vitamin B-12: 1729 pg/mL — ABNORMAL HIGH (ref 232–1245)

## 2023-02-17 LAB — VITAMIN B1: Thiamine: 94.8 nmol/L (ref 66.5–200.0)

## 2023-02-17 LAB — METHYLMALONIC ACID, SERUM: Methylmalonic Acid: 169 nmol/L (ref 0–378)

## 2023-02-17 LAB — VITAMIN D 25 HYDROXY (VIT D DEFICIENCY, FRACTURES): Vit D, 25-Hydroxy: 45.9 ng/mL (ref 30.0–100.0)

## 2023-02-17 LAB — TSH RFX ON ABNORMAL TO FREE T4: TSH: 2.95 u[IU]/mL (ref 0.450–4.500)

## 2023-02-18 ENCOUNTER — Ambulatory Visit: Payer: Medicare PPO | Admitting: Physical Therapy

## 2023-02-18 DIAGNOSIS — M6281 Muscle weakness (generalized): Secondary | ICD-10-CM

## 2023-02-18 DIAGNOSIS — G8929 Other chronic pain: Secondary | ICD-10-CM

## 2023-02-18 DIAGNOSIS — M7702 Medial epicondylitis, left elbow: Secondary | ICD-10-CM | POA: Diagnosis not present

## 2023-02-18 DIAGNOSIS — M25511 Pain in right shoulder: Secondary | ICD-10-CM | POA: Diagnosis not present

## 2023-02-18 NOTE — Therapy (Signed)
 OUTPATIENT PHYSICAL THERAPY RIGHT SHOULDER  and LEFT ELBOW TREATMENT   Patient Name: Jacqueline Meza MRN: [id number] DOB:1945-02-16, 78 y.o., female Today's Date: 02/18/2023  END OF SESSION:  PT End of Session - 02/18/23 1013     Visit Number 5    Number of Visits 24    Date for PT Re-Evaluation 03/17/23    PT Start Time 1012    PT Stop Time 1050    PT Time Calculation (min) 38 min    Activity Tolerance Patient tolerated treatment well;No increased pain    Behavior During Therapy [facility] for tasks assessed/performed             Past Medical History:  Diagnosis Date   Actinic keratosis 12/12/2007   R leg - bx proven    Basal cell carcinoma 01/04/2007   L upper back - superficial    Basal cell carcinoma 11/08/2013   L ant scalp    Basal cell carcinoma 12/10/2014   L nasal tip    Basal cell carcinoma 06/30/2021   left nasal tip, schedule mohs   Dysplastic nevus 01/23/2008   R ant ankle - moderate   Dysplastic nevus 11/08/2012   R prox dorsum of foot - mild    Endometriosis    Hyperlipidemia    Migraines    since childhood   Raynaud's phenomenon    Past Surgical History:  Procedure Laterality Date   ABDOMINAL HYSTERECTOMY  01/12/1993   endometriosis and worsening migraines   CESAREAN SECTION  1988   COLONOSCOPY  2005   Dr Dessa   DILATION AND CURETTAGE OF UTERUS     done after miscarriages twice   MOHS SURGERY     Left Side of Nose   Patient Active Problem List   Diagnosis Date Noted   Sleep disturbance 01/28/2022   Advance directive discussed with patient 06/04/2014   Hyperlipidemia    Routine general medical examination at a health care facility 04/11/2010   Chronic migraine without aura without status migrainosus, not intractable 10/27/2007    PCP: Dr. Charlie Denise  REFERRING PROVIDER: Dr. Jacques Copland  REFERRING DIAG:  M77.02 (ICD-10-CM) - Medial epicondylitis of elbow, left  M75.41 (ICD-10-CM) - Impingement syndrome of right shoulder   G25.89 (ICD-10-CM) - Scapular dyskinesis    THERAPY DIAG:  Medial epicondylitis, left elbow  Muscle weakness (generalized)  Chronic right shoulder pain  Rationale for Evaluation and Treatment: Rehabilitation  ONSET DATE: > 6 months  SUBJECTIVE:  SUBJECTIVE STATEMENT:   Pt reports that she has no pain at start of PT session. Was able to go grocery shopping this morning and had no pain loading the groceries into car.     Hand dominance: Right  PERTINENT HISTORY: Per Dr. Watt note from 01/17/2022: AAMINA SKIFF is a 78 y.o. very pleasant female patient with Body mass index is 21.2 kg/m. who presents with the following:   F/u R shoulder - saw several times in 2024 and was doing ok with home rehab, and had a shoulder injection in the past.    At this point, she has been having worsening pain over the last 2 months, and she has pain in the right shoulder as well as in the posterior aspect of the shoulder.  She does not have any new or focal changes to neck pain.  She does not have any radicular signs or symptoms.  She does have some pain with terminal abduction and rotational maneuvers she has had a shoulder injection.  She also has been doing some home rehab for her shoulder.   Newer development of left-sided medial elbow pain at the medial epicondyle.  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    PAIN:  Are you having pain? Yes: NPRS scale: 2/10 Pain location: Right shoulder and left Elbow Pain description: sharp with movement and otherwise achy Aggravating factors: Reaching with RUE- pulling hurts left elbow Relieving factors: Tylenol/voltaren , Stretching  PRECAUTIONS: None  RED FLAGS:    WEIGHT BEARING  RESTRICTIONS: No  FALLS:  Has patient fallen in last 6 months? No  LIVING ENVIRONMENT: Lives with: lives alone Lives in: House/apartment Stairs: Yes: Internal: 17 steps; on left going up Has following equipment at home: None  OCCUPATION: Retired   PLOF: Independent  PATIENT GOALS:Be able to sleep without pain  NEXT MD VISIT:   OBJECTIVE:  Note: Objective measures were completed at Evaluation unless otherwise noted.  DIAGNOSTIC FINDINGS:  CLINICAL DATA:  Six-month right shoulder pain.   EXAM: RIGHT SHOULDER - 2+ VIEW   COMPARISON:  None available   FINDINGS: Moderate acromioclavicular joint space narrowing and peripheral osteophytosis. Minimal inferior glenoid degenerative spurring. The glenohumeral joint space is maintained. No acute fracture or dislocation. The visualized portion of the right lung is unremarkable.   IMPRESSION: Moderate acromioclavicular osteoarthritis.     Electronically Signed   By: Tanda Lyons M.D.   On: 04/18/2022 14:48  PATIENT SURVEYS:  FOTO To be performed next visit  COGNITION: Overall cognitive status: Within functional limits for tasks assessed     SENSATION: WFL  POSTURE: Forward head and rounded shoulders  UPPER EXTREMITY ROM:   Active ROM Right eval Left eval  Shoulder flexion 173   Shoulder extension    Shoulder abduction 182   Shoulder adduction    Shoulder internal rotation    Shoulder external rotation 82   Elbow flexion    Elbow extension    Wrist flexion    Wrist extension    Wrist ulnar deviation    Wrist radial deviation    Wrist pronation    Wrist supination    (Blank rows = not tested)  UPPER EXTREMITY MMT:  MMT Right eval Left eval  Shoulder flexion 4 5  Shoulder extension 5 5  Shoulder abduction 4 5  Shoulder adduction 5 5  Shoulder internal rotation 5 5  Shoulder external rotation 4+ 5  Middle  trapezius    Lower trapezius    Elbow flexion 5 5  Elbow extension 5 5  Wrist flexion  5 4+  Wrist extension 5 5  Wrist ulnar deviation 5 5  Wrist radial deviation 5 5  Wrist pronation 5 5  Wrist supination 5 5  Grip strength (lbs) 47 42  (Blank rows = not tested)  SHOULDER SPECIAL TESTS: Impingement tests: Neer impingement test: positive  SLAP lesions: Crank test: negative Instability tests: Sulcus sign: negative Rotator cuff assessment: Empty can test: negative and Full can test: negative Biceps assessment: Yergason's test: negative and Speed's test: negative  JOINT MOBILITY TESTING:  Hypomobile GH inf glide  PALPATION:  (+) tenderness along anterior shoulder and AC joint (+) tenderness along medical epicondyle region of left elbow                                                                                                                             TREATMENT DATE: 02/18/2023   Therex:  Scifit UBE 1.5 min forward/1.5 min reverse 30 sec rest break between bouts.  Bicep curl YTB 2 x 15  Tricep extension YTB 2 x 15  Hammer Curl YTB 2 x 15  Supination/pronation 2 x 15 3# dumbell  Neutral wrist curl 2 x 15, 3 # dumbell    Manual:  STM to left forearm- wrist flexors origin near medial epicondyle and muscle belly of flexor carpi radialis and pronator x 6 minutes  Cross friction massage to left wrist flex near insertion of medial epicondyle  x2 min   PATIENT EDUCATION: Education details: , Instruction in symtoms and treatment of medical epicondylitis and shoulder impingement.  Person educated: Patient Education method: Explanation, Demonstration, and Verbal cues Education comprehension: verbalized understanding and returned demonstration  HOME EXERCISE PROGRAM: Access Code: [email or web address] URL: [email or web address] Date: 02/04/2023 Prepared by: Reyes London  Exercises - Seated Wrist Extension with Overpressure  - 1 x daily - 3 sets - 20-30 hold - Putty Squeezes  - 1 x daily - 3 sets - 10 reps - Key Pinch with Putty  - 1 x daily - 7 x  weekly - 3 sets - 10 reps  ASSESSMENT:  CLINICAL IMPRESSION: Patient continues to present with excellent motivation for treatment. PT treatment focused on improved muscle activation with eccentric control in forearm, biceps and triceps. STM performed for TP release to medial forearm upon completion of therex. No significant increase in pain throughout session. She will benefit from skilled PT services to improve her pain, functional ROM, and overall UE strength to enable her to improve sleep and return to PLOF with less pain.   OBJECTIVE IMPAIRMENTS: decreased ROM, decreased strength, hypomobility, impaired UE functional use, and pain.   ACTIVITY LIMITATIONS: carrying and lifting  PARTICIPATION LIMITATIONS: cleaning, laundry, driving, shopping, community activity, and yard work  PERSONAL FACTORS: 1 comorbidity: arthritis  are also affecting patient's functional outcome.   REHAB POTENTIAL: Good  CLINICAL DECISION MAKING: Evolving/moderate complexity  EVALUATION COMPLEXITY: Moderate   GOALS: Goals reviewed with patient? Yes  SHORT TERM GOALS: Target date: 03/17/2023  Pt will be independent with HEP in order to improve strength and balance in order to decrease fall risk and improve function at home and work.  Baseline: EVAL- Patient with no formal HEP Goal status: INITIAL  LONG TERM GOALS: Target date: 04/27/2023  Pt will decrease worst pain as reported on NPRS by at least 3 points in order to demonstrate clinically significant reduction in pain. Baseline: EVAL- 7/10 at worst Right shoulder and left elbow Goal status: INITIAL  2.  Pt will improve FOTO to target score of  to display perceived improvements in ability to complete ADL's.  Baseline: EVAL- Will add value of goal and complete survey next visit. Goal status: INITIAL  3.  Patient will report sleeping through the night without walking up due to pain. Baseline: EVAL- Difficulty sleeping through the night due to right  shoulder pain. Goal status: INITIAL  4.  Patient will resume performing all ADL's, chores including housework without any restriction or pain Baseline: EVAL- Patient unable to perform several push/pull activities and chores due to pain. Goal status: INITIAL  5.  Patient will demonstrate performing reaching overhead and across body motions without restriction or report of pain.  Baseline: EVAL- Patient with difficulty with sharp Right shoulder pain with reaching and overhead movements. Goal status: INITIAL   PLAN:  PT FREQUENCY: 1-2x/week  PT DURATION: 12 weeks  PLANNED INTERVENTIONS: 97164- PT Re-evaluation, 97110-Therapeutic exercises, 97530- Therapeutic activity, 97112- Neuromuscular re-education, 97535- Self Care, 02859- Manual therapy, [id number]- Electrical stimulation (manual), [phone]- Ionotophoresis 4mg /ml Dexamethasone, Patient/Family education, Taping, Dry Needling, Joint mobilization, Joint manipulation, Spinal manipulation, Spinal mobilization, Compression bandaging, Cryotherapy, and Moist heat  PLAN FOR NEXT SESSION:  Continue with DN as appropriate; Manual therapy for pain relief and optimal ROM, Wrist strengthening as appropriate.    Massie FORBES Dollar, PT 02/18/2023, 11:38 AM

## 2023-02-22 ENCOUNTER — Encounter: Payer: Self-pay | Admitting: Neurology

## 2023-02-22 NOTE — Telephone Encounter (Signed)
 [facility] Update on appt patient called and cancelled due to a family emergency and did not reschedule. Pt selected to call back to reschedule.

## 2023-02-23 ENCOUNTER — Ambulatory Visit: Payer: Medicare PPO | Admitting: Physical Therapy

## 2023-02-23 DIAGNOSIS — M6281 Muscle weakness (generalized): Secondary | ICD-10-CM

## 2023-02-23 DIAGNOSIS — M7702 Medial epicondylitis, left elbow: Secondary | ICD-10-CM

## 2023-02-23 DIAGNOSIS — M25511 Pain in right shoulder: Secondary | ICD-10-CM | POA: Diagnosis not present

## 2023-02-23 DIAGNOSIS — G8929 Other chronic pain: Secondary | ICD-10-CM

## 2023-02-23 NOTE — Therapy (Unsigned)
OUTPATIENT PHYSICAL THERAPY RIGHT SHOULDER  and LEFT ELBOW TREATMENT   Patient Name: Jacqueline Meza MRN: [id number] DOB:[birth year], 78 y.o., female Today's Date: 02/23/2023  END OF SESSION:  PT End of Session - 02/23/23 1445     Visit Number 6    Number of Visits 24    Date for PT Re-Evaluation 03/17/23    PT Start Time 1447    PT Stop Time 1530    PT Time Calculation (min) 43 min    Activity Tolerance Patient tolerated treatment well;No increased pain    Behavior During Therapy Jacqueline Meza for tasks assessed/performed             Past Medical History:  Diagnosis Date   Actinic keratosis 12/12/2007   R leg - bx proven    Basal cell carcinoma 01/04/2007   L upper back - superficial    Basal cell carcinoma 11/08/2013   L ant scalp    Basal cell carcinoma 12/10/2014   L nasal tip    Basal cell carcinoma 06/30/2021   left nasal tip, schedule mohs   Dysplastic nevus 01/23/2008   R ant ankle - moderate   Dysplastic nevus 11/08/2012   R prox dorsum of foot - mild    Endometriosis    Hyperlipidemia    Migraines    since childhood   Raynaud's phenomenon    Past Surgical History:  Procedure Laterality Date   ABDOMINAL HYSTERECTOMY  01/12/1993   endometriosis and worsening migraines   CESAREAN SECTION  1988   COLONOSCOPY  2005   Dr Jacqueline Meza   DILATION AND CURETTAGE OF UTERUS     done after miscarriages twice   MOHS SURGERY     Left Side of Nose   Patient Active Problem List   Diagnosis Date Noted   Sleep disturbance 01/28/2022   Advance directive discussed with patient 06/04/2014   Hyperlipidemia    Routine general medical examination at a health care facility 04/11/2010   Chronic migraine without aura without status migrainosus, not intractable 10/27/2007    PCP: Dr. Tillman Meza  REFERRING PROVIDER: Dr. Karleen Hampshire Meza  REFERRING DIAG:  M77.02 (ICD-10-CM) - Medial epicondylitis of elbow, left  M75.41 (ICD-10-CM) - Impingement syndrome of right shoulder   G25.89 (ICD-10-CM) - Scapular dyskinesis    THERAPY DIAG:  Medial epicondylitis, left elbow  Muscle weakness (generalized)  Chronic right shoulder pain  Rationale for Evaluation and Treatment: Rehabilitation  ONSET DATE: > 6 months  SUBJECTIVE:  SUBJECTIVE STATEMENT:   Pt reports that she has no pain at start of PT session. Was able to go grocery shopping this morning and had no pain loading the groceries into car.     Hand dominance: Right  PERTINENT HISTORY: Per Dr. Patsy Meza note from 01/17/2022: Jacqueline Meza is a 78 y.o. very pleasant female patient with Body mass index is 21.2 kg/m. who presents with the following:   F/u R shoulder - saw several times in 2024 and was doing ok with home rehab, and had a shoulder injection in the past.    At this point, she has been having worsening pain over the last 2 months, and she has pain in the right shoulder as well as in the posterior aspect of the shoulder.  She does not have any new or focal changes to neck pain.  She does not have any radicular signs or symptoms.  She does have some pain with terminal abduction and rotational maneuvers she has had a shoulder injection.  She also has been doing some home rehab for her shoulder.   Newer development of left-sided medial elbow pain at the medial epicondyle.  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    PAIN:  Are you having pain? Yes: NPRS scale: 2/10 Pain location: Right shoulder and left Elbow Pain description: sharp with movement and otherwise achy Aggravating factors: Reaching with RUE- pulling hurts left elbow Relieving factors: Tylenol/voltaren, Stretching  PRECAUTIONS: None  RED FLAGS:    WEIGHT BEARING  RESTRICTIONS: No  FALLS:  Has patient fallen in last 6 months? No  LIVING ENVIRONMENT: Lives with: lives alone Lives in: House/apartment Stairs: Yes: Internal: 17 steps; on left going up Has following equipment at home: None  OCCUPATION: Retired   PLOF: Independent  PATIENT GOALS:Be able to sleep without pain  NEXT MD VISIT:   OBJECTIVE:  Note: Objective measures were completed at Evaluation unless otherwise noted.  DIAGNOSTIC FINDINGS:  CLINICAL DATA:  Six-month right shoulder pain.   EXAM: RIGHT SHOULDER - 2+ VIEW   COMPARISON:  None available   FINDINGS: Moderate acromioclavicular joint space narrowing and peripheral osteophytosis. Minimal inferior glenoid degenerative spurring. The glenohumeral joint space is maintained. No acute fracture or dislocation. The visualized portion of the right lung is unremarkable.   IMPRESSION: Moderate acromioclavicular osteoarthritis.     Electronically Signed   By: Jacqueline Meza M.D.   On: 04/18/2022 14:48  PATIENT SURVEYS:  FOTO To be performed next visit  COGNITION: Overall cognitive status: Within functional limits for tasks assessed     SENSATION: WFL  POSTURE: Forward head and rounded shoulders  UPPER EXTREMITY ROM:   Active ROM Right eval Left eval  Shoulder flexion 173   Shoulder extension    Shoulder abduction 182   Shoulder adduction    Shoulder internal rotation    Shoulder external rotation 82   Elbow flexion    Elbow extension    Wrist flexion    Wrist extension    Wrist ulnar deviation    Wrist radial deviation    Wrist pronation    Wrist supination    (Blank rows = not tested)  UPPER EXTREMITY MMT:  MMT Right eval Left eval  Shoulder flexion 4 5  Shoulder extension 5 5  Shoulder abduction 4 5  Shoulder adduction 5 5  Shoulder internal rotation 5 5  Shoulder external rotation 4+ 5  Middle  trapezius    Lower trapezius    Elbow flexion 5 5  Elbow extension 5 5  Wrist flexion  5 4+  Wrist extension 5 5  Wrist ulnar deviation 5 5  Wrist radial deviation 5 5  Wrist pronation 5 5  Wrist supination 5 5  Grip strength (lbs) 47 42  (Blank rows = not tested)  SHOULDER SPECIAL TESTS: Impingement tests: Neer impingement test: positive  SLAP lesions: Crank test: negative Instability tests: Sulcus sign: negative Rotator cuff assessment: Empty can test: negative and Full can test: negative Biceps assessment: Yergason's test: negative and Speed's test: negative  JOINT MOBILITY TESTING:  Hypomobile GH inf glide  PALPATION:  (+) tenderness along anterior shoulder and AC joint (+) tenderness along medical epicondyle region of left elbow                                                                                                                             TREATMENT DATE: 02/23/2023   Therex:  Scifit UBE 2 min forward/2 min reverse 1 min sec rest break between bouts.  Clothes pin move from vertical to horizontal attachment with LUE, cues for decreased speed of eccentric movement 2 x 10 each direction  Forearm curl eccentrics with TYB x 12 Ulnar deviation eccentric with YTB x 12 Radial devicaiton eccentric control YTB x 10  Slow supination/pronation with 3# dumbell  Finger AROM flextion with MPJ extension  x 15    Bicep curl YTB 2 x 15  Tricep extension YTB 2 x 15  Hammer Curl YTB 2 x 15  Supination/pronation 2 x 15 3# dumbell  Neutral wrist curl 2 x 15, 3 # dumbell    Manual:  STM to left forearm- wrist flexors origin near medial epicondyle and muscle belly of flexor carpi radialis and pronator x 6 minutes  Cross friction massage to left wrist flex near insertion of medial epicondyle  x2 min   PATIENT EDUCATION: Education details: , Instruction in symtoms and treatment of medical epicondylitis and shoulder impingement.  Person educated: Patient Education method: Explanation, Demonstration, and Verbal cues Education comprehension: verbalized  understanding and returned demonstration  HOME EXERCISE PROGRAM: Access Code: [email or web address] URL: [email or web address] Date: 02/04/2023 Prepared by: Maureen Ralphs  Exercises - Seated Wrist Extension with Overpressure  - 1 x daily - 3 sets - 20-30 hold - Putty Squeezes  - 1 x daily - 3 sets - 10 reps - Key Pinch with Putty  - 1 x daily - 7 x weekly - 3 sets - 10 reps  ASSESSMENT:  CLINICAL IMPRESSION: Patient continues to present with excellent motivation for treatment. PT treatment focused on improved muscle activation with eccentric control in forearm, biceps and triceps. STM performed for TP release to medial forearm upon completion of therex. No significant increase in pain throughout session. She will benefit from skilled PT services to improve her pain, functional ROM, and overall UE strength to enable her to improve sleep and  return to PLOF with less pain.   OBJECTIVE IMPAIRMENTS: decreased ROM, decreased strength, hypomobility, impaired UE functional use, and pain.   ACTIVITY LIMITATIONS: carrying and lifting  PARTICIPATION LIMITATIONS: cleaning, laundry, driving, shopping, community activity, and yard work  PERSONAL FACTORS: 1 comorbidity: arthritis  are also affecting patient's functional outcome.   REHAB POTENTIAL: Good  CLINICAL DECISION MAKING: Evolving/moderate complexity  EVALUATION COMPLEXITY: Moderate   GOALS: Goals reviewed with patient? Yes  SHORT TERM GOALS: Target date: 03/17/2023  Pt will be independent with HEP in order to improve strength and balance in order to decrease fall risk and improve function at home and work.  Baseline: EVAL- Patient with no formal HEP Goal status: INITIAL  LONG TERM GOALS: Target date: 04/27/2023  Pt will decrease worst pain as reported on NPRS by at least 3 points in order to demonstrate clinically significant reduction in pain. Baseline: EVAL- 7/10 at worst Right shoulder and left elbow Goal status:  INITIAL  2.  Pt will improve FOTO to target score of  to display perceived improvements in ability to complete ADL's.  Baseline: EVAL- Will add value of goal and complete survey next visit. Goal status: INITIAL  3.  Patient will report sleeping through the night without walking up due to pain. Baseline: EVAL- Difficulty sleeping through the night due to right shoulder pain. Goal status: INITIAL  4.  Patient will resume performing all ADL's, chores including housework without any restriction or pain Baseline: EVAL- Patient unable to perform several push/pull activities and chores due to pain. Goal status: INITIAL  5.  Patient will demonstrate performing reaching overhead and across body motions without restriction or report of pain.  Baseline: EVAL- Patient with difficulty with sharp Right shoulder pain with reaching and overhead movements. Goal status: INITIAL   PLAN:  PT FREQUENCY: 1-2x/week  PT DURATION: 12 weeks  PLANNED INTERVENTIONS: 97164- PT Re-evaluation, 97110-Therapeutic exercises, 97530- Therapeutic activity, 97112- Neuromuscular re-education, 97535- Self Care, 45409- Manual therapy, [id number]- Electrical stimulation (manual), [phone]- Ionotophoresis 4mg /ml Dexamethasone, Patient/Family education, Taping, Dry Needling, Joint mobilization, Joint manipulation, Spinal manipulation, Spinal mobilization, Compression bandaging, Cryotherapy, and Moist heat  PLAN FOR NEXT SESSION:  Continue with DN as appropriate; Manual therapy for pain relief and optimal ROM, Wrist strengthening as appropriate.    Golden Pop, PT 02/23/2023, 2:45 PM

## 2023-02-25 ENCOUNTER — Ambulatory Visit: Payer: Medicare PPO | Admitting: Physical Therapy

## 2023-02-25 DIAGNOSIS — M6281 Muscle weakness (generalized): Secondary | ICD-10-CM

## 2023-02-25 DIAGNOSIS — G8929 Other chronic pain: Secondary | ICD-10-CM

## 2023-02-25 DIAGNOSIS — M25511 Pain in right shoulder: Secondary | ICD-10-CM | POA: Diagnosis not present

## 2023-02-25 DIAGNOSIS — M7702 Medial epicondylitis, left elbow: Secondary | ICD-10-CM

## 2023-02-25 NOTE — Therapy (Signed)
OUTPATIENT PHYSICAL THERAPY RIGHT SHOULDER  and LEFT ELBOW TREATMENT   Patient Name: Jacqueline Meza MRN: [id number] DOB:[birth year], 78 y.o., female Today's Date: 02/25/2023  END OF SESSION:  PT End of Session - 02/25/23 1023     Visit Number 7    Number of Visits 24    Date for PT Re-Evaluation 03/17/23    PT Start Time 1016    PT Stop Time 1056    PT Time Calculation (min) 40 min    Activity Tolerance Patient tolerated treatment well;No increased pain    Behavior During Therapy [facility] for tasks assessed/performed             Past Medical History:  Diagnosis Date   Actinic keratosis 12/12/2007   R leg - bx proven    Basal cell carcinoma 01/04/2007   L upper back - superficial    Basal cell carcinoma 11/08/2013   L ant scalp    Basal cell carcinoma 12/10/2014   L nasal tip    Basal cell carcinoma 06/30/2021   left nasal tip, schedule mohs   Dysplastic nevus 01/23/2008   R ant ankle - moderate   Dysplastic nevus 11/08/2012   R prox dorsum of foot - mild    Endometriosis    Hyperlipidemia    Migraines    since childhood   Raynaud's phenomenon    Past Surgical History:  Procedure Laterality Date   ABDOMINAL HYSTERECTOMY  01/12/1993   endometriosis and worsening migraines   CESAREAN SECTION  1988   COLONOSCOPY  2005   Dr Lemar Livings   DILATION AND CURETTAGE OF UTERUS     done after miscarriages twice   MOHS SURGERY     Left Side of Nose   Patient Active Problem List   Diagnosis Date Noted   Sleep disturbance 01/28/2022   Advance directive discussed with patient 06/04/2014   Hyperlipidemia    Routine general medical examination at a health care facility 04/11/2010   Chronic migraine without aura without status migrainosus, not intractable 10/27/2007    PCP: Dr. Tillman Abide  REFERRING PROVIDER: Dr. Karleen Hampshire Copland  REFERRING DIAG:  M77.02 (ICD-10-CM) - Medial epicondylitis of elbow, left  M75.41 (ICD-10-CM) - Impingement syndrome of right shoulder   G25.89 (ICD-10-CM) - Scapular dyskinesis    THERAPY DIAG:  Medial epicondylitis, left elbow  Muscle weakness (generalized)  Chronic right shoulder pain  Rationale for Evaluation and Treatment: Rehabilitation  ONSET DATE: > 6 months  SUBJECTIVE:  SUBJECTIVE STATEMENT:   Pt reports that she has no pain at start of PT session. No pain with opening bottle of medicine over the last 2 days. No other pain reported by pt throughout session.   Hand dominance: Right  PERTINENT HISTORY: Per Dr. Patsy Lager note from 01/17/2022: Jacqueline Meza is a 78 y.o. very pleasant female patient with Body mass index is 21.2 kg/m. who presents with the following:   F/u R shoulder - saw several times in 2024 and was doing ok with home rehab, and had a shoulder injection in the past.    At this point, she has been having worsening pain over the last 2 months, and she has pain in the right shoulder as well as in the posterior aspect of the shoulder.  She does not have any new or focal changes to neck pain.  She does not have any radicular signs or symptoms.  She does have some pain with terminal abduction and rotational maneuvers she has had a shoulder injection.  She also has been doing some home rehab for her shoulder.   Newer development of left-sided medial elbow pain at the medial epicondyle.  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    PAIN:  Are you having pain? Yes: NPRS scale: 2/10 Pain location: Right shoulder and left Elbow Pain description: sharp with movement and otherwise achy Aggravating factors: Reaching with RUE- pulling hurts left elbow Relieving factors: Tylenol/voltaren, Stretching  PRECAUTIONS: None  RED FLAGS:    WEIGHT  BEARING RESTRICTIONS: No  FALLS:  Has patient fallen in last 6 months? No  LIVING ENVIRONMENT: Lives with: lives alone Lives in: House/apartment Stairs: Yes: Internal: 17 steps; on left going up Has following equipment at home: None  OCCUPATION: Retired   PLOF: Independent  PATIENT GOALS:Be able to sleep without pain  NEXT MD VISIT:   OBJECTIVE:  Note: Objective measures were completed at Evaluation unless otherwise noted.  DIAGNOSTIC FINDINGS:  CLINICAL DATA:  Six-month right shoulder pain.   EXAM: RIGHT SHOULDER - 2+ VIEW   COMPARISON:  None available   FINDINGS: Moderate acromioclavicular joint space narrowing and peripheral osteophytosis. Minimal inferior glenoid degenerative spurring. The glenohumeral joint space is maintained. No acute fracture or dislocation. The visualized portion of the right lung is unremarkable.   IMPRESSION: Moderate acromioclavicular osteoarthritis.     Electronically Signed   By: Neita Garnet M.D.   On: 04/18/2022 14:48  PATIENT SURVEYS:  FOTO To be performed next visit  COGNITION: Overall cognitive status: Within functional limits for tasks assessed     SENSATION: WFL  POSTURE: Forward head and rounded shoulders  UPPER EXTREMITY ROM:   Active ROM Right eval Left eval  Shoulder flexion 173   Shoulder extension    Shoulder abduction 182   Shoulder adduction    Shoulder internal rotation    Shoulder external rotation 82   Elbow flexion    Elbow extension    Wrist flexion    Wrist extension    Wrist ulnar deviation    Wrist radial deviation    Wrist pronation    Wrist supination    (Blank rows = not tested)  UPPER EXTREMITY MMT:  MMT Right eval Left eval  Shoulder flexion 4 5  Shoulder extension 5 5  Shoulder abduction 4 5  Shoulder adduction 5 5  Shoulder internal rotation 5 5  Shoulder external rotation 4+ 5  Middle trapezius    Lower trapezius    Elbow flexion 5 5  Elbow extension 5 5  Wrist  flexion 5 4+  Wrist extension 5 5  Wrist ulnar deviation 5 5  Wrist radial deviation 5 5  Wrist pronation 5 5  Wrist supination 5 5  Grip strength (lbs) 47 42  (Blank rows = not tested)  SHOULDER SPECIAL TESTS: Impingement tests: Neer impingement test: positive  SLAP lesions: Crank test: negative Instability tests: Sulcus sign: negative Rotator cuff assessment: Empty can test: negative and Full can test: negative Biceps assessment: Yergason's test: negative and Speed's test: negative  JOINT MOBILITY TESTING:  Hypomobile GH inf glide  PALPATION:  (+) tenderness along anterior shoulder and AC joint (+) tenderness along medical epicondyle region of left elbow                                                                                                                             TREATMENT DATE: 02/25/2023   Therex:  Scifit UBE 2.5 min forward/2.5 min reverse 1 min sec rest break between bouts.  Hammer curl x 12 RTB  Bicep curl 2x 15 RTB  Tricep extension x 15 RTB Mid row x  RTB x 12  Shoulder extension YTB x 15 bil  Forearm bar rall forward/reverse palms down 2 x 30 sec each  Forearm bar rall out with palms up x 40 sec    Manual:  STM to left forearm- wrist flexors origin near medial epicondyle and muscle belly of flexor carpi radialis and pronator x 6 minutes  Cross friction massage to left wrist flex near insertion of medial epicondyle  x2 min  Unbilled Ice cup massage at end of visit x 2 min to left medial elbow epicondyle region. Patient reported her elbow region feeling Cold and numb immediately after ice treatment.    PATIENT EDUCATION: Education details: , Instruction in symtoms and treatment of medical epicondylitis and shoulder impingement.  Person educated: Patient Education method: Explanation, Demonstration, and Verbal cues Education comprehension: verbalized understanding and returned demonstration  HOME EXERCISE PROGRAM: Access Code: [email or web address] URL:  [email or web address] Date: 02/04/2023 Prepared by: Maureen Ralphs  Exercises - Seated Wrist Extension with Overpressure  - 1 x daily - 3 sets - 20-30 hold - Putty Squeezes  - 1 x daily - 3 sets - 10 reps - Key Pinch with Putty  - 1 x daily - 7 x weekly - 3 sets - 10 reps  ASSESSMENT:  CLINICAL IMPRESSION: Patient continues to present with excellent motivation for treatment. PT treatment focused on improved muscle activation and generalized BUE strengthening with increased resistance and repetitions. Educated on benefits of eccentric control for management tendonitis. She will benefit from skilled PT services to improve her pain, functional ROM, and overall UE strength to enable her to improve sleep and return to PLOF with less pain.   OBJECTIVE IMPAIRMENTS: decreased ROM, decreased strength, hypomobility, impaired UE functional use, and pain.  ACTIVITY LIMITATIONS: carrying and lifting  PARTICIPATION LIMITATIONS: cleaning, laundry, driving, shopping, community activity, and yard work  PERSONAL FACTORS: 1 comorbidity: arthritis  are also affecting patient's functional outcome.   REHAB POTENTIAL: Good  CLINICAL DECISION MAKING: Evolving/moderate complexity  EVALUATION COMPLEXITY: Moderate   GOALS: Goals reviewed with patient? Yes  SHORT TERM GOALS: Target date: 03/17/2023  Pt will be independent with HEP in order to improve strength and balance in order to decrease fall risk and improve function at home and work.  Baseline: EVAL- Patient with no formal HEP Goal status: INITIAL  LONG TERM GOALS: Target date: 04/27/2023  Pt will decrease worst pain as reported on NPRS by at least 3 points in order to demonstrate clinically significant reduction in pain. Baseline: EVAL- 7/10 at worst Right shoulder and left elbow Goal status: INITIAL  2.  Pt will improve FOTO to target score of  to display perceived improvements in ability to complete ADL's.  Baseline: EVAL-  Will add value of goal and complete survey next visit. Goal status: INITIAL  3.  Patient will report sleeping through the night without walking up due to pain. Baseline: EVAL- Difficulty sleeping through the night due to right shoulder pain. Goal status: INITIAL  4.  Patient will resume performing all ADL's, chores including housework without any restriction or pain Baseline: EVAL- Patient unable to perform several push/pull activities and chores due to pain. Goal status: INITIAL  5.  Patient will demonstrate performing reaching overhead and across body motions without restriction or report of pain.  Baseline: EVAL- Patient with difficulty with sharp Right shoulder pain with reaching and overhead movements. Goal status: INITIAL   PLAN:  PT FREQUENCY: 1-2x/week  PT DURATION: 12 weeks  PLANNED INTERVENTIONS: 97164- PT Re-evaluation, 97110-Therapeutic exercises, 97530- Therapeutic activity, 97112- Neuromuscular re-education, 97535- Self Care, 16109- Manual therapy, [id number]- Electrical stimulation (manual), [phone]- Ionotophoresis 4mg /ml Dexamethasone, Patient/Family education, Taping, Dry Needling, Joint mobilization, Joint manipulation, Spinal manipulation, Spinal mobilization, Compression bandaging, Cryotherapy, and Moist heat  PLAN FOR NEXT SESSION:  Continue with DN as appropriate;  Manual therapy for pain relief and optimal ROM,  Wrist strengthening as appropriate.    Golden Pop, PT 02/25/2023, 10:28 AM

## 2023-03-02 ENCOUNTER — Ambulatory Visit: Payer: Medicare PPO | Admitting: Physical Therapy

## 2023-03-02 DIAGNOSIS — G8929 Other chronic pain: Secondary | ICD-10-CM

## 2023-03-02 DIAGNOSIS — M7702 Medial epicondylitis, left elbow: Secondary | ICD-10-CM

## 2023-03-02 DIAGNOSIS — M6281 Muscle weakness (generalized): Secondary | ICD-10-CM | POA: Diagnosis not present

## 2023-03-02 DIAGNOSIS — M25511 Pain in right shoulder: Secondary | ICD-10-CM | POA: Diagnosis not present

## 2023-03-02 NOTE — Therapy (Unsigned)
OUTPATIENT PHYSICAL THERAPY RIGHT SHOULDER  and LEFT ELBOW TREATMENT/ Discharge Summary    Patient Name: Jacqueline Meza MRN: [id number] DOB:1945-08-22, 78 y.o., female Today's Date: 03/02/2023  END OF SESSION:  PT End of Session - 03/02/23 1459     Visit Number 8    Number of Visits 24    Date for PT Re-Evaluation 03/17/23    PT Start Time 1449    PT Stop Time 1528    PT Time Calculation (min) 39 min    Activity Tolerance Patient tolerated treatment well;No increased pain    Behavior During Therapy [facility] for tasks assessed/performed             Past Medical History:  Diagnosis Date   Actinic keratosis 12/12/2007   R leg - bx proven    Basal cell carcinoma 01/04/2007   L upper back - superficial    Basal cell carcinoma 11/08/2013   L ant scalp    Basal cell carcinoma 12/10/2014   L nasal tip    Basal cell carcinoma 06/30/2021   left nasal tip, schedule mohs   Dysplastic nevus 01/23/2008   R ant ankle - moderate   Dysplastic nevus 11/08/2012   R prox dorsum of foot - mild    Endometriosis    Hyperlipidemia    Migraines    since childhood   Raynaud's phenomenon    Past Surgical History:  Procedure Laterality Date   ABDOMINAL HYSTERECTOMY  01/12/1993   endometriosis and worsening migraines   CESAREAN SECTION  1988   COLONOSCOPY  2005   Dr Lemar Livings   DILATION AND CURETTAGE OF UTERUS     done after miscarriages twice   MOHS SURGERY     Left Side of Nose   Patient Active Problem List   Diagnosis Date Noted   Sleep disturbance 01/28/2022   Advance directive discussed with patient 06/04/2014   Hyperlipidemia    Routine general medical examination at a health care facility 04/11/2010   Chronic migraine without aura without status migrainosus, not intractable 10/27/2007    PCP: Dr. Tillman Abide  REFERRING PROVIDER: Dr. Karleen Hampshire Copland  REFERRING DIAG:  M77.02 (ICD-10-CM) - Medial epicondylitis of elbow, left  M75.41 (ICD-10-CM) - Impingement syndrome  of right shoulder  G25.89 (ICD-10-CM) - Scapular dyskinesis    THERAPY DIAG:  Medial epicondylitis, left elbow  Muscle weakness (generalized)  Chronic right shoulder pain  Rationale for Evaluation and Treatment: Rehabilitation  ONSET DATE: > 6 months  SUBJECTIVE:  SUBJECTIVE STATEMENT:   Pt reports that she has no pain in R shoulder or L forearm since PT treatment last week.  States that she feels ready to d/c from PT.  Hand dominance: Right  PERTINENT HISTORY: Per Dr. Patsy Lager note from 01/17/2022: Jacqueline Meza is a 78 y.o. very pleasant female patient with Body mass index is 21.2 kg/m. who presents with the following:   F/u R shoulder - saw several times in 2024 and was doing ok with home rehab, and had a shoulder injection in the past.    At this point, she has been having worsening pain over the last 2 months, and she has pain in the right shoulder as well as in the posterior aspect of the shoulder.  She does not have any new or focal changes to neck pain.  She does not have any radicular signs or symptoms.  She does have some pain with terminal abduction and rotational maneuvers she has had a shoulder injection.  She also has been doing some home rehab for her shoulder.   Newer development of left-sided medial elbow pain at the medial epicondyle.  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    PAIN:  Are you having pain? Yes: NPRS scale: 2/10 Pain location: Right shoulder and left Elbow Pain description: sharp with movement and otherwise achy Aggravating factors: Reaching with RUE- pulling hurts left elbow Relieving factors: Tylenol/voltaren, Stretching  PRECAUTIONS: None  RED FLAGS:    WEIGHT BEARING  RESTRICTIONS: No  FALLS:  Has patient fallen in last 6 months? No  LIVING ENVIRONMENT: Lives with: lives alone Lives in: House/apartment Stairs: Yes: Internal: 17 steps; on left going up Has following equipment at home: None  OCCUPATION: Retired   PLOF: Independent  PATIENT GOALS:Be able to sleep without pain  NEXT MD VISIT:   OBJECTIVE:  Note: Objective measures were completed at Evaluation unless otherwise noted.  DIAGNOSTIC FINDINGS:  CLINICAL DATA:  Six-month right shoulder pain.   EXAM: RIGHT SHOULDER - 2+ VIEW   COMPARISON:  None available   FINDINGS: Moderate acromioclavicular joint space narrowing and peripheral osteophytosis. Minimal inferior glenoid degenerative spurring. The glenohumeral joint space is maintained. No acute fracture or dislocation. The visualized portion of the right lung is unremarkable.   IMPRESSION: Moderate acromioclavicular osteoarthritis.     Electronically Signed   By: Neita Garnet M.D.   On: 04/18/2022 14:48  PATIENT SURVEYS:  FOTO To be performed next visit  COGNITION: Overall cognitive status: Within functional limits for tasks assessed     SENSATION: WFL  POSTURE: Forward head and rounded shoulders  UPPER EXTREMITY ROM:   Active ROM Right eval Left eval R 2/18   Shoulder flexion 173  175 pain free 175  Shoulder extension      Shoulder abduction 182  [facility] WFL  Shoulder adduction      Shoulder internal rotation      Shoulder external rotation 82  87 WFL  Elbow flexion      Elbow extension      Wrist flexion      Wrist extension      Wrist ulnar deviation      Wrist radial deviation      Wrist pronation      Wrist supination      (Blank rows = not tested)  UPPER EXTREMITY MMT:  MMT Right 2/18 Left 2/18  Shoulder flexion 5 5  Shoulder extension 5 5  Shoulder abduction 5 5  Shoulder adduction 5 5  Shoulder internal rotation 5 5  Shoulder external rotation 5 5  Middle trapezius    Lower  trapezius    Elbow flexion 5 5  Elbow extension 5 5  Wrist flexion 5 5  Wrist extension 5 5  Wrist ulnar deviation 5 5  Wrist radial deviation 5 5  Wrist pronation 5 5  Wrist supination 5 5  Grip strength (lbs) 47 47  (Blank rows = not tested)  SHOULDER SPECIAL TESTS: Impingement tests: Neer impingement test: positive  SLAP lesions: Crank test: negative Instability tests: Sulcus sign: negative Rotator cuff assessment: Empty can test: negative and Full can test: negative Biceps assessment: Yergason's test: negative and Speed's test: negative  JOINT MOBILITY TESTING:  Hypomobile GH inf glide  PALPATION:  (+) tenderness along anterior shoulder and AC joint (+) tenderness along medical epicondyle region of left elbow                                                                                                                             TREATMENT DATE: 03/02/2023   Therex:  Scifit UBE forward/3 min reverse 0.5 min sec rest break between bouts.  Eccentric Hammer curl x 10 2# DB  Eccentric Bicep curl x 10 2# DB  Eccentric Wrist flexion x10 2 # DB x 12  Eccentric ulnar deviation x 8 YTB  Eccentric radial deviation with flexion x 8 YTB Towel wringing x 5 bil   UE ROM and MMT testing. See above.     Manual:  STM to left forearm- wrist flexors origin near medial epicondyle and muscle belly of flexor carpi radialis and pronator x 6 minutes  Cross friction massage to left wrist flex near insertion of medial epicondyle  x2 min  Trigger Point Dry Needling   Subsequent Treatment: Instructions provided previously at initial dry needling treatment.    Patient Verbal Consent Given: Yes Education Handout Provided: Previously Provided Muscles Treated: Left wrist flexor carpi ulnaris and supinator using 0.16 x 40 Electrical Stimulation Performed: No Treatment Response/Outcome: 2 local twitch response immediately upon insertion. No adverse reaction.   PATIENT  EDUCATION: Education details: , Instruction in symtoms and treatment of medical epicondylitis and shoulder impingement.  Person educated: Patient Education method: Explanation, Demonstration, and Verbal cues Education comprehension: verbalized understanding and returned demonstration  HOME EXERCISE PROGRAM: Access Code: [email or web address] URL: [email or web address] Date: 02/04/2023 Prepared by: Maureen Ralphs  Exercises - Seated Wrist Extension with Overpressure  - 1 x daily - 3 sets - 20-30 hold - Putty Squeezes  - 1 x daily - 3 sets - 10 reps - Key Pinch with Putty  - 1 x daily - 7 x weekly - 3 sets - 10 reps  ASSESSMENT:  CLINICAL IMPRESSION: Patient continues to present with excellent motivation for treatment. Pt reports no pain in the last 2 weeks in the L elbow/forearm or the R  shoulder. Pt feels ready to d/c from PT services. PT assessed ROM and MMT. Noted to have improve symmetry of ROM and strength without pain. Educated in use of eccentric strengthening for pain management if medial epicondylitis pain returns. Due to improved strength, ROM, and decreased pain, pt will no longer require PT services at this time.   OBJECTIVE IMPAIRMENTS: decreased ROM, decreased strength, hypomobility, impaired UE functional use, and pain.   ACTIVITY LIMITATIONS: carrying and lifting  PARTICIPATION LIMITATIONS: cleaning, laundry, driving, shopping, community activity, and yard work  PERSONAL FACTORS: 1 comorbidity: arthritis  are also affecting patient's functional outcome.   REHAB POTENTIAL: Good  CLINICAL DECISION MAKING: Evolving/moderate complexity  EVALUATION COMPLEXITY: Moderate   GOALS: Goals reviewed with patient? Yes  SHORT TERM GOALS: Target date: 03/17/2023  Pt will be independent with HEP in order to improve strength and balance in order to decrease fall risk and improve function at home and work.  Baseline: EVAL- Patient with no formal HEP Goal status:  INITIAL  LONG TERM GOALS: Target date: 04/27/2023  Pt will decrease worst pain as reported on NPRS by at least 3 points in order to demonstrate clinically significant reduction in pain. Baseline: EVAL- 7/10 at worst Right shoulder and left elbow 2/18: 1/10 in the L elbow at insertion Goal status: MET   2.  Pt will improve FOTO to target score of  to display perceived improvements in ability to complete ADL's.  Baseline: EVAL- Will add value of goal and complete survey next visit. Goal status: n/a   3.  Patient will report sleeping through the night without walking up due to pain. Baseline: EVAL- Difficulty sleeping through the night due to right shoulder pain. 2/18: states that she has been able to sleep through the night for the last 2 weeks  Goal status: MET  4.  Patient will resume performing all ADL's, chores including housework without any restriction or pain Baseline: EVAL- Patient unable to perform several push/pull activities and chores due to pain. 2/18: states that she was abel to vacuum twice last week on separate days without pain in either UE  Goal status: MET  5.  Patient will demonstrate performing reaching overhead and across body motions without restriction or report of pain.  Baseline: EVAL- Patient with difficulty with sharp Right shoulder pain with reaching and overhead movements. 2/18 pain free full ROM Goal status: MET   PLAN:  PT FREQUENCY: 1-2x/week  PT DURATION: 12 weeks  PLANNED INTERVENTIONS: 97164- PT Re-evaluation, 97110-Therapeutic exercises, 97530- Therapeutic activity, 97112- Neuromuscular re-education, 97535- Self Care, 16109- Manual therapy, [id number]- Electrical stimulation (manual), [phone]- Ionotophoresis 4mg /ml Dexamethasone, Patient/Family education, Taping, Dry Needling, Joint mobilization, Joint manipulation, Spinal manipulation, Spinal mobilization, Compression bandaging, Cryotherapy, and Moist heat  PLAN FOR NEXT SESSION:  N/a D/c'ed from PT.     Golden Pop, PT 03/02/2023, 3:00 PM

## 2023-03-03 ENCOUNTER — Encounter: Payer: Self-pay | Admitting: Family Medicine

## 2023-03-04 ENCOUNTER — Ambulatory Visit: Payer: Medicare PPO | Admitting: Physical Therapy

## 2023-03-09 ENCOUNTER — Ambulatory Visit: Payer: Medicare PPO | Admitting: Physical Therapy

## 2023-03-12 ENCOUNTER — Encounter: Payer: Medicare PPO | Admitting: Physical Therapy

## 2023-03-13 ENCOUNTER — Other Ambulatory Visit: Payer: Self-pay | Admitting: Neurology

## 2023-03-13 DIAGNOSIS — G43709 Chronic migraine without aura, not intractable, without status migrainosus: Secondary | ICD-10-CM

## 2023-03-16 ENCOUNTER — Encounter: Payer: Medicare PPO | Admitting: Physical Therapy

## 2023-03-16 ENCOUNTER — Telehealth: Payer: Self-pay | Admitting: Internal Medicine

## 2023-03-16 NOTE — Progress Notes (Unsigned)
 Jacqueline Dittrich T. Blaize Epple, MD, CAQ Sports Medicine [facility] [address] [place], [address]  Phone: [phone]  FAX: [phone]  Jacqueline Meza - 78 y.o. female  MRN [id number]  Date of Birth: 06/22/1945  Date: 03/17/2023  PCP: No primary care provider on file.  Referral: [name], MD  Chief Complaint  Patient presents with   Shoulder Pain    Follow Up Right Shoulder after PT   Rash    Neck/Chest/Legs   Elbow Pain    Left   Subjective:   Jacqueline Meza is a 78 y.o. very pleasant female patient with Body mass index is 21.51 kg/m. who presents with the following:  Patient presents to follow-up on physical therapy and chronic ongoing impingement syndrome of the right shoulder as well as medial epicondylitis.  Right shoulder is really doing dramatically better.  This point she is having essentially no pain or minimal.  Range of motion is excellent, strength is excellent, she has been very compliant with home rehab, and she feels really good about how the situation is going.  She still does have some left-sided medial elbow pain, however she is quite a bit improved with this as well.  She is doing some rehab with some grip work with putty along with supination, pronation, flexion and extension at the wrist.  Both she and PT feel like she has made some good progress.  She does have relatively diffuse itchy rash on her torso, arm, lower legs.  Diffuse rash    Review of Systems is noted in the HPI, as appropriate  Objective:   BP 110/80 (BP Location: Left Arm, Patient Position: Sitting, Cuff Size: Normal)   Pulse 91   Temp 98.2 F (36.8 C) (Temporal)   Ht 5\' 6"  (1.676 m)   Wt 133 lb 4 oz (60.4 kg)   SpO2 98%   BMI 21.51 kg/m   GEN: No acute distress; alert,appropriate. PULM: Breathing comfortably in no respiratory distress PSYCH: Normally interactive.    Shoulder: R Inspection: No muscle wasting or  winging Ecchymosis/edema: neg  AC joint, scapula, clavicle: NT Abduction: full, 5/5 Flexion: full, 5/5 IR, full, lift-off: 5/5 ER at neutral: full, 5/5 AC crossover and compression: neg Neer: neg Hawkins: neg Drop Test: neg Jobe: neg Supraspinatus insertion: NT Bicipital groove: NT Speed's: neg Yergason's: neg Sulcus sign: neg Scapular dyskinesis: none C5-T1 intact Sensation intact Grip 5/5    Elbow: L Ecchymosis or edema: neg ROM: full flexion, extension, pronation, supination Shoulder ROM: Full Flexion: 5/5 Extension: 5/5 Supination: 5/5  Pronation: 5/5 Wrist ext: 5/5 Wrist flexion: 5/5 No gross bony abnormality Varus and Valgus stress: stable ECRB tenderness: neg Medial epicondyle: tender at flexor tendon insertion - mild Lateral epicondyle, resisted wrist extension from wrist full pronation and flexion: NT Resisted pronation: very tender grip: 5/5  sensation intact   Diffuse macular somewhat pinkish rash leg, chest and upper extremities  Laboratory and Imaging Data:  Assessment and Plan:     ICD-10-CM   1. Impingement syndrome of right shoulder  M75.41     2. Scapular dyskinesis  G25.89     3. Medial epicondylitis of elbow, left  M77.02     4. Rash  R21      From a shoulder standpoint she is doing great, and from a medial epicondylar standpoint she is doing very well.  I encouraged her to keep doing her home rehab, think she is going to do great.  She does have a diffuse macular rash to see itchy.  Encouraged her to take some scheduled antihistamines, also give her a round of some steroids to take.  Medication Management during today's office visit: Meds ordered this encounter  Medications   predniSONE (DELTASONE) 20 MG tablet    Sig: Take 2 tablets (40 mg total) by mouth daily.    Dispense:  14 tablet    Refill:  0   There are no discontinued medications.  Orders placed today for conditions managed today: No orders of the defined types  were placed in this encounter.   Disposition: No follow-ups on file.  [employer] One speech-to-text software was used for transcription in this dictation.  Possible transcriptional errors can occur using Animal nutritionist.   Signed,  Elpidio Galea. Kelliann Pendergraph, MD   Outpatient Encounter Medications as of 03/17/2023  Medication Sig   Cranberry-Milk Thistle (LIVER & KIDNEY CLEANSER PO) Take by mouth.   cyanocobalamin (VITAMIN B12) 1000 MCG tablet Take 1,000 mcg by mouth daily.   diclofenac (VOLTAREN) 75 MG EC tablet TAKE 1 TABLET BY MOUTH 2 TIMES A DAY   EMGALITY 120 MG/ML SOAJ INJECT 120 MG INTO THE SKIN EVERY 30 DAYS   Emollient (COLLAGEN EX) Apply topically.   Magnesium Citrate 200 MG TABS Take 1 tablet by mouth daily.   predniSONE (DELTASONE) 20 MG tablet Take 2 tablets (40 mg total) by mouth daily.   SUMAtriptan (IMITREX) 100 MG tablet TAKE ONE TABLET BY MOUTH AT ONSET OF HEADACHE; MAY REPEAT ONE TABLET IN 2 HOURS IF NEEDED.   tretinoin (RETIN-A) 0.025 % cream Apply topically at bedtime. Qhs to dark spots face   Turmeric (QC TUMERIC COMPLEX) 500 MG CAPS Take 1 capsule by mouth daily at 6 (six) AM.   UNABLE TO FIND Take 4 capsules by mouth daily. Med Name: Nutrafol 4 capsules a day of hair growth nutraceutical and 1 capsule of hairbiotic   Facility-Administered Encounter Medications as of 03/17/2023  Medication   botulinum toxin Type A (BOTOX) injection 155 Units

## 2023-03-16 NOTE — Telephone Encounter (Signed)
 Contacted Jacqueline Meza to schedule their annual wellness visit. Patient declined to schedule AWV at this time. Transferring care.  [facility] Care Guide [facility] AWV TEAM Direct Dial: [phone]

## 2023-03-16 NOTE — Telephone Encounter (Signed)
 Noted---she knows I am retiring

## 2023-03-17 ENCOUNTER — Ambulatory Visit: Admitting: Family Medicine

## 2023-03-17 ENCOUNTER — Encounter: Payer: Self-pay | Admitting: Family Medicine

## 2023-03-17 VITALS — BP 110/80 | HR 91 | Temp 98.2°F | Ht 66.0 in | Wt 133.2 lb

## 2023-03-17 DIAGNOSIS — M7702 Medial epicondylitis, left elbow: Secondary | ICD-10-CM | POA: Diagnosis not present

## 2023-03-17 DIAGNOSIS — R21 Rash and other nonspecific skin eruption: Secondary | ICD-10-CM | POA: Diagnosis not present

## 2023-03-17 DIAGNOSIS — G2589 Other specified extrapyramidal and movement disorders: Secondary | ICD-10-CM

## 2023-03-17 DIAGNOSIS — M7541 Impingement syndrome of right shoulder: Secondary | ICD-10-CM | POA: Diagnosis not present

## 2023-03-17 MED ORDER — PREDNISONE 20 MG PO TABS: 40.0000 mg | ORAL_TABLET | Freq: Every day | ORAL | 0 refills | Status: DC

## 2023-03-17 NOTE — Patient Instructions (Signed)
  4. Anti-histamines. Claritin (generic loratadine), Zyrtec (generic certrizine), and Allegra (fexofenadine) are all over the counter now. Cheap at [employer], [employer], [employer]. Pick 1 - sometimes it is helpful to try and see which one helps you the most or vary it up some over time. 5. You can also do an extra tablet of Benadryl 25 mg at night if needed. It makes you somewhat drowsy.

## 2023-03-18 ENCOUNTER — Encounter: Payer: Medicare PPO | Admitting: Physical Therapy

## 2023-03-19 ENCOUNTER — Ambulatory Visit
Admission: RE | Admit: 2023-03-19 | Discharge: 2023-03-19 | Disposition: A | Discharge status: Home / Self Care | Payer: Medicare PPO | Source: Ambulatory Visit | Attending: Internal Medicine | Admitting: Internal Medicine

## 2023-03-19 DIAGNOSIS — Z1231 Encounter for screening mammogram for malignant neoplasm of breast: Secondary | ICD-10-CM | POA: Diagnosis not present

## 2023-03-21 ENCOUNTER — Other Ambulatory Visit: Payer: Self-pay | Admitting: Internal Medicine

## 2023-03-23 ENCOUNTER — Encounter: Payer: Medicare PPO | Admitting: Physical Therapy

## 2023-03-25 ENCOUNTER — Encounter: Payer: Medicare PPO | Admitting: Physical Therapy

## 2023-03-30 ENCOUNTER — Encounter: Payer: Medicare PPO | Admitting: Physical Therapy

## 2023-04-01 ENCOUNTER — Encounter: Payer: Medicare PPO | Admitting: Physical Therapy

## 2023-04-20 IMAGING — MG MM DIGITAL SCREENING BILAT W/ TOMO AND CAD
8 series · 8 of 24 positions shown · non-contrast
Comparison: Previous exam(s).

CLINICAL DATA: Screening.

EXAM:
DIGITAL SCREENING BILATERAL MAMMOGRAM WITH TOMOSYNTHESIS AND CAD
TECHNIQUE: Bilateral screening digital craniocaudal and mediolateral oblique
mammograms were obtained. Bilateral screening digital breast
tomosynthesis was performed. The images were evaluated with
computer-aided detection.

[R CC synth-2D]
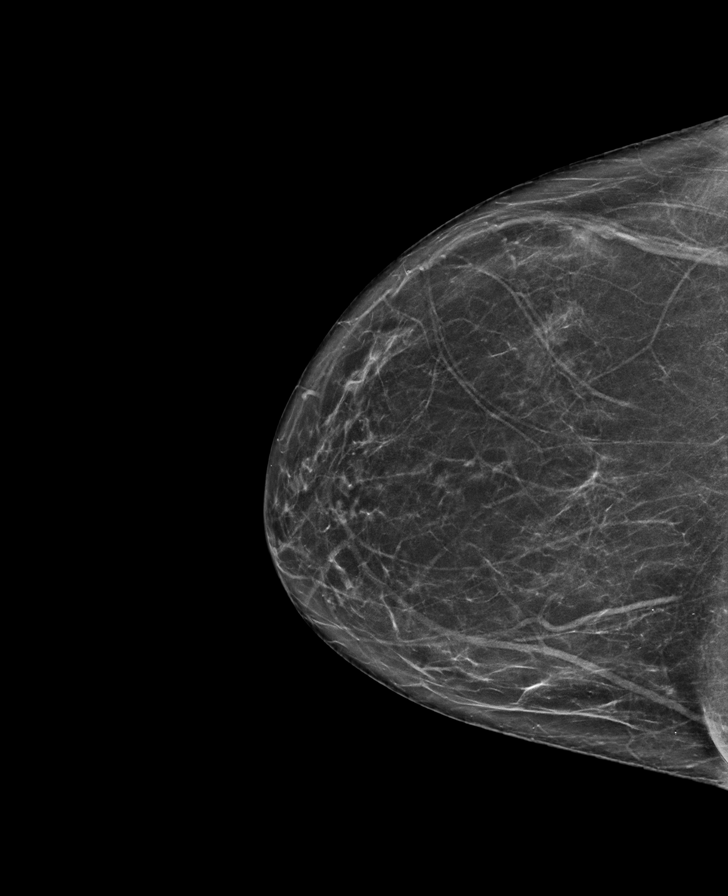

[L MLO synth-2D]
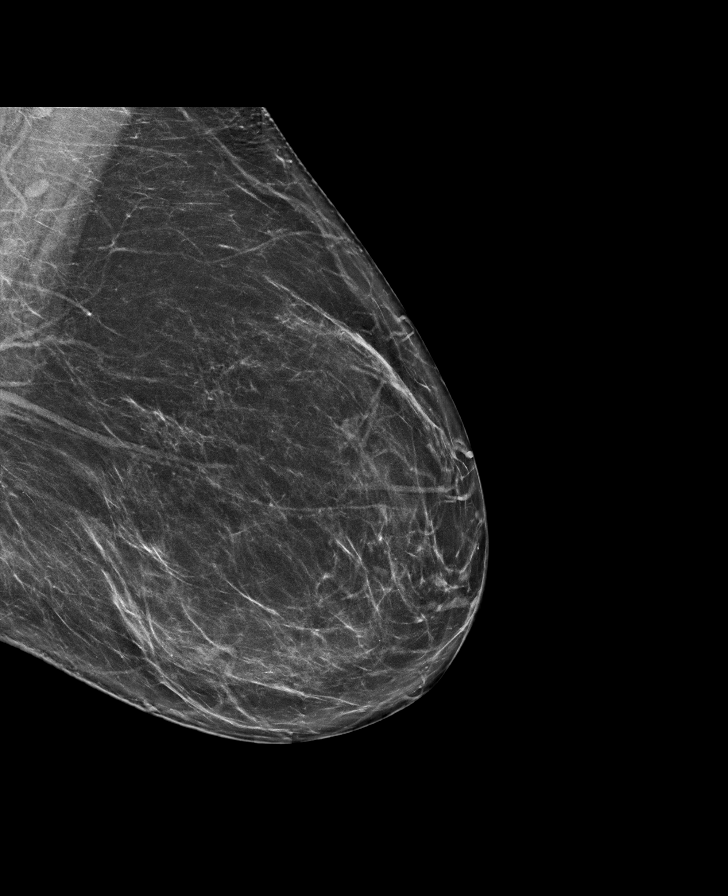

[R MLO synth-2D]
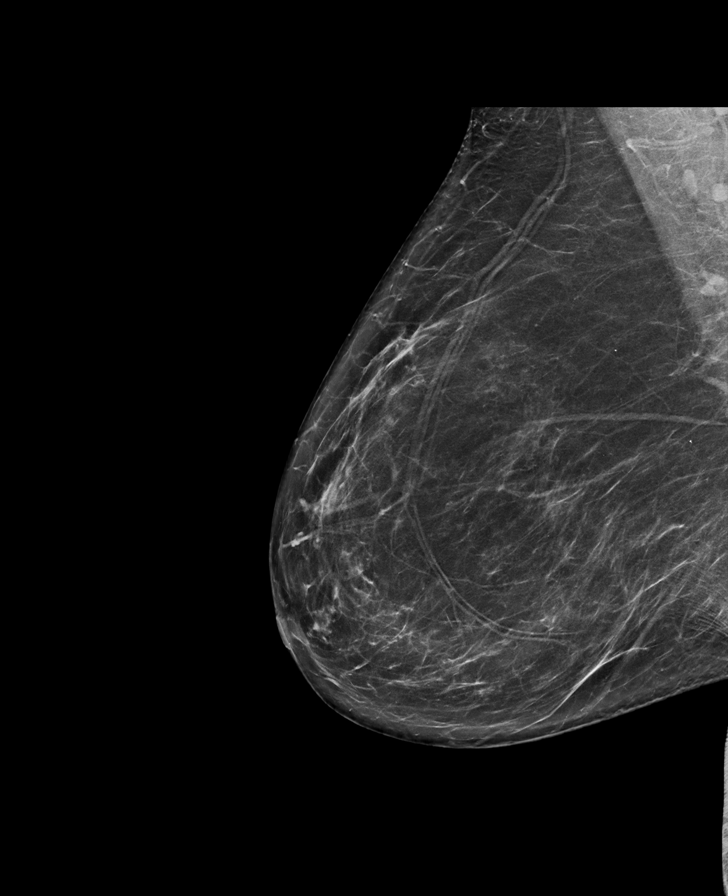

[L CC synth-2D]
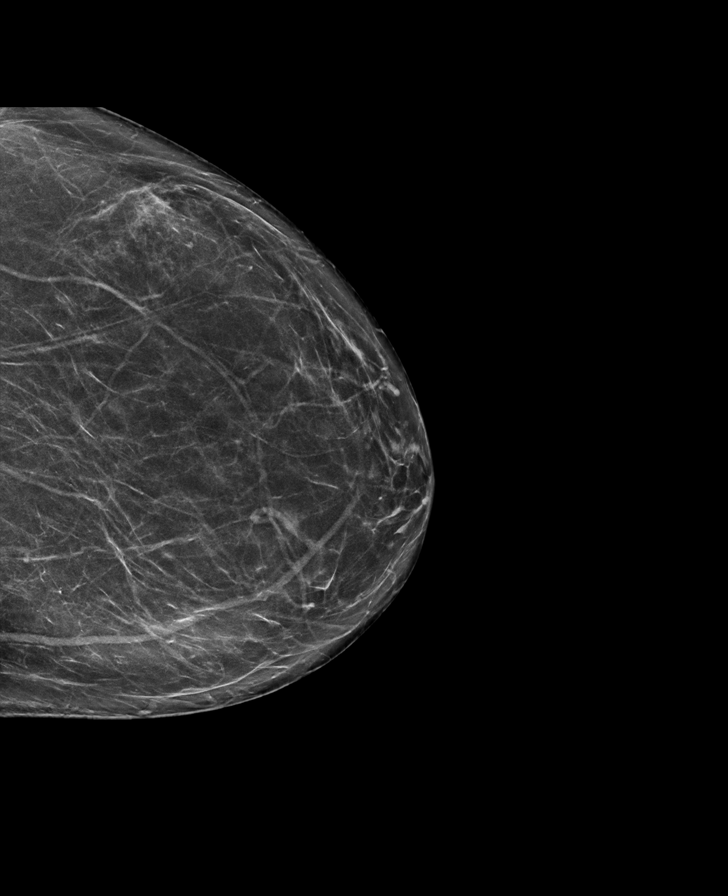

[R CC tomo · tomo slice 35/69.0]
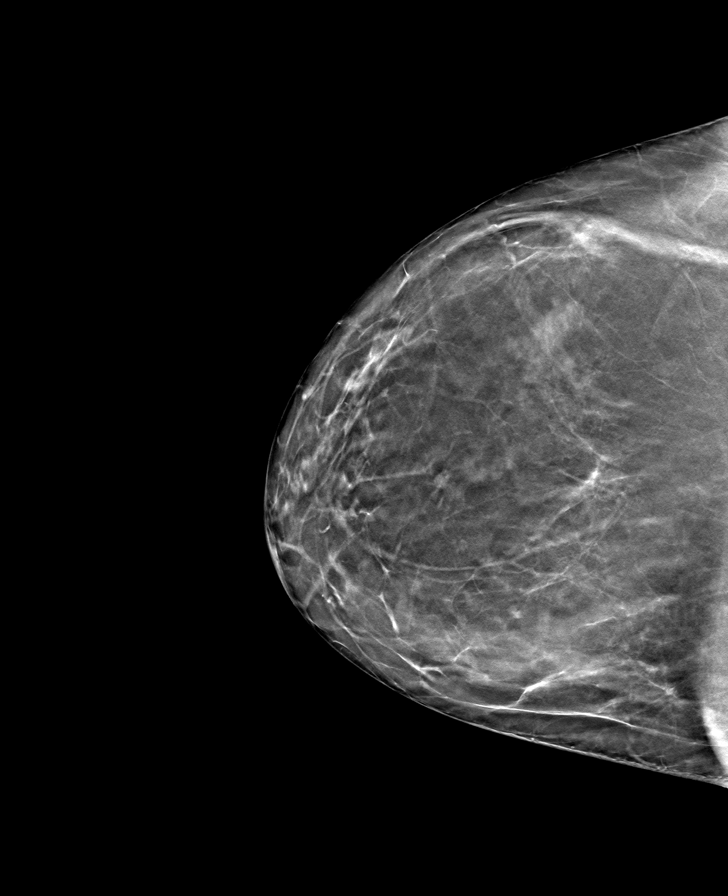

[L MLO tomo · tomo slice 38/75.0]
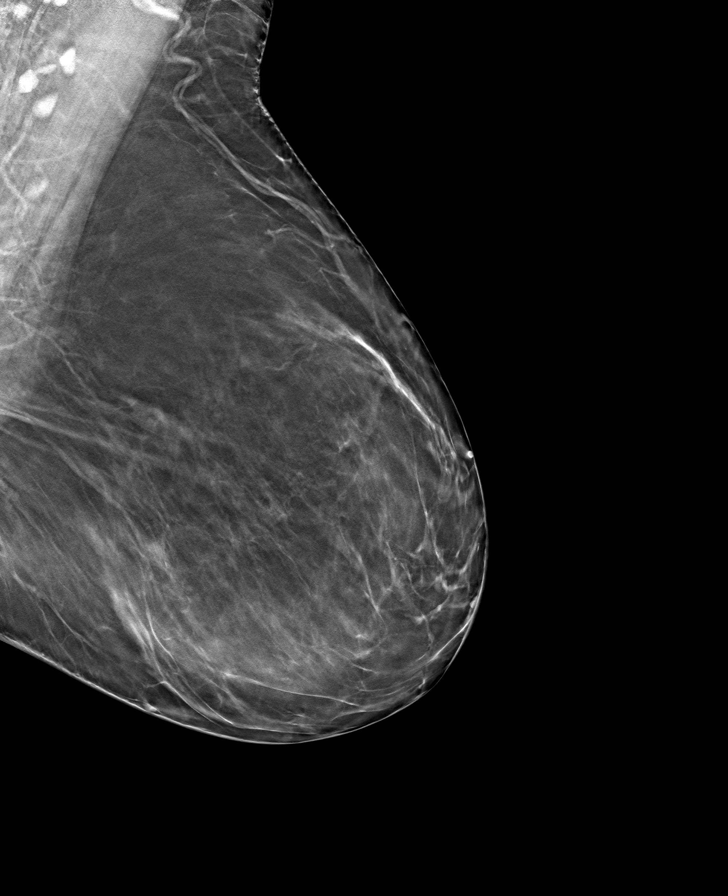

[L CC tomo · tomo slice 36/71.0]
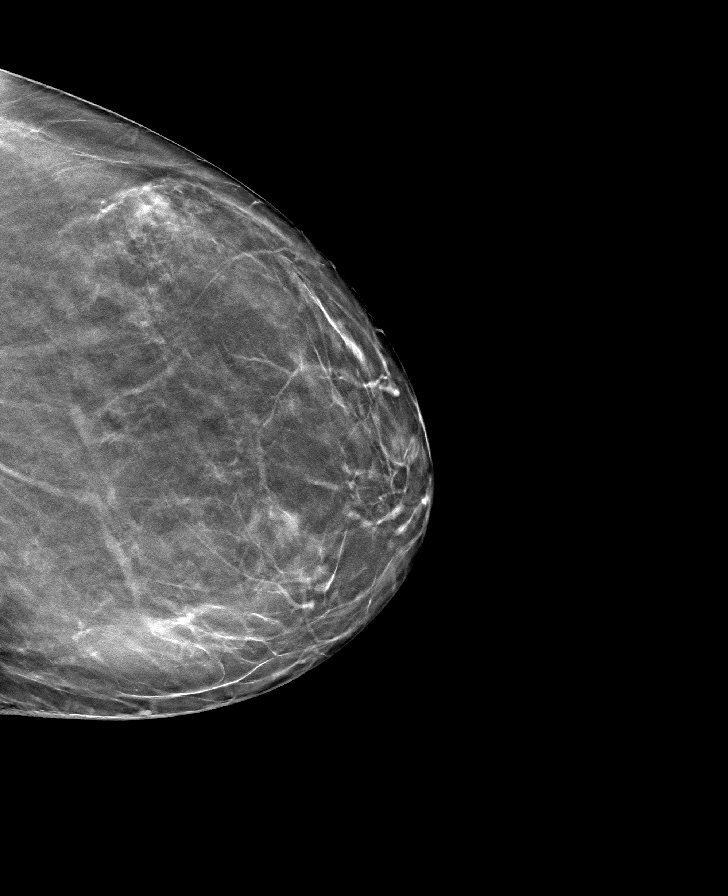

[R MLO tomo · tomo slice 39/76.0]
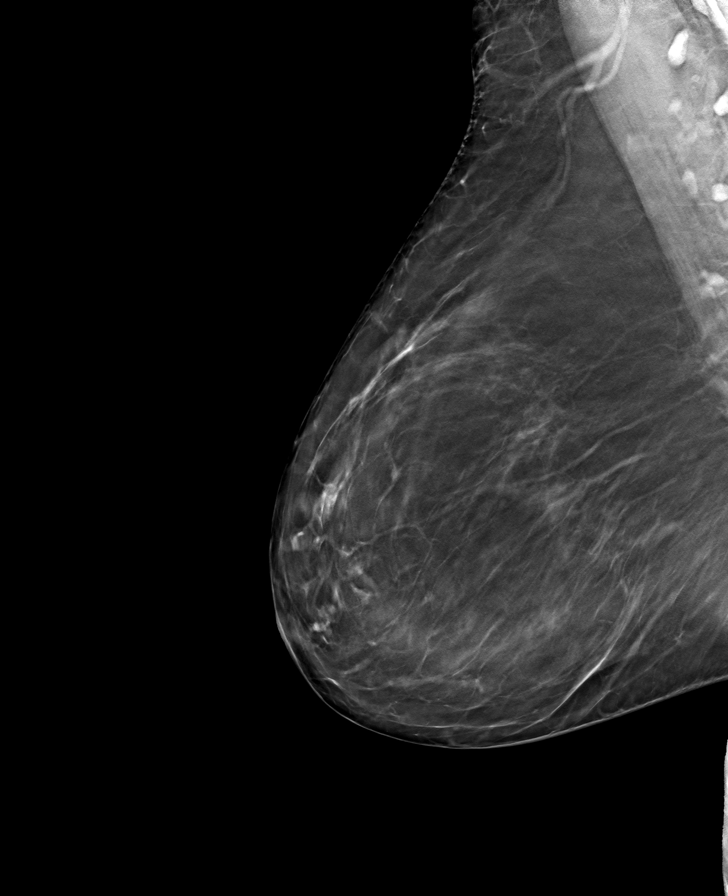

[8 of 24 positions shown; findings below may reference images not displayed]

ACR Breast Density Category b: There are scattered areas of
fibroglandular density.
FINDINGS: There are no findings suspicious for malignancy.
IMPRESSION: No mammographic evidence of malignancy. A result letter of this
screening mammogram will be mailed directly to the patient.

RECOMMENDATION:
Screening mammogram in one year. (Code:51-O-LD2)

BI-RADS CATEGORY  1: Negative.

## 2023-05-06 ENCOUNTER — Ambulatory Visit: Payer: Medicare PPO | Admitting: Neurology

## 2023-05-10 ENCOUNTER — Ambulatory Visit: Payer: Medicare PPO | Admitting: Neurology

## 2023-05-10 DIAGNOSIS — G43709 Chronic migraine without aura, not intractable, without status migrainosus: Secondary | ICD-10-CM

## 2023-05-10 MED ORDER — ONABOTULINUMTOXINA 200 UNITS IJ SOLR
155.0000 [IU] | Freq: Once | INTRAMUSCULAR | Status: AC
  Administered 2023-05-10: 155 [IU] via INTRAMUSCULAR

## 2023-05-10 NOTE — Progress Notes (Signed)
 error

## 2023-05-10 NOTE — Progress Notes (Signed)
 Botox  consent signed  Botox - 200 units x 1 vial Lot: [id number] Expiration: 04/2025 NDC: 7829-5621-30  Bacteriostatic 0.9% Sodium Chloride- 4 mL  Lot: [id number] Expiration: 11/13/2023 NDC: 6962-9528-41  Dx: L24.401 B/B Witnessed by Logan Rings RN

## 2023-05-10 NOTE — Progress Notes (Signed)
 Consent Form Botulism Toxin Injection For Chronic Migraine  05/10/2023: Patient feels that her migraines cause her jaw to ache in her jaw aching also can cause her migraines to worsen it is a trigger for migraines included 5 units in each masseter to see if that helps with migraine severity.Sister is glenda spruill. Patient stays with dr Tresia Fruit for botox .  02/11/2023: Patient feels that her migraines cause her jaw to ache in her jaw aching also can cause her migraines to worsen it is a trigger for migraines included 15 units in each masseter to see if that helps with migraine severity.  11/10/2023: Sarah slack performed 07/06/2022: stable, doing great 04/13/2022: Decreased topiramate  to 100mg  qhs was bid. 2 migraines last 3 months, sumatriptan  works. Emgalityalso working.  01/19/2022; Stable, Still excellent response >> 75% improvement freq and severity. 10/19/2021: doing well still, stable 07/30/2021: stable 05/05/2021: stable 01/28/2021: hasn't had one migraine or headache tremendous improvement.   10/29/2020:  Still excellent response >> 75% improvement freq and severity. She has two 30 year old twin grandchildren, a boy and a girl, kindergarten.   Reviewed orally with patient, additionally signature is on file:  Botulism toxin has been approved by the Federal drug administration for treatment of chronic migraine. Botulism toxin does not cure chronic migraine and it may not be effective in some patients.  The administration of botulism toxin is accomplished by injecting a small amount of toxin into the muscles of the neck and head. Dosage must be titrated for each individual. Any benefits resulting from botulism toxin tend to wear off after 3 months with a repeat injection required if benefit is to be maintained. Injections are usually done every 3-4 months with maximum effect peak achieved by about 2 or 3 weeks. Botulism toxin is expensive and you should be sure of what costs you will incur resulting  from the injection.  The side effects of botulism toxin use for chronic migraine may include:   -Transient, and usually mild, facial weakness with facial injections  -Transient, and usually mild, head or neck weakness with head/neck injections  -Reduction or loss of forehead facial animation due to forehead muscle weakness  -Eyelid drooping  -Dry eye  -Pain at the site of injection or bruising at the site of injection  -Double vision  -Potential unknown long term risks  Contraindications: You should not have Botox  if you are pregnant, nursing, allergic to albumin, have an infection, skin condition, or muscle weakness at the site of the injection, or have myasthenia gravis, Lambert-Eaton syndrome, or ALS.  It is also possible that as with any injection, there may be an allergic reaction or no effect from the medication. Reduced effectiveness after repeated injections is sometimes seen and rarely infection at the injection site may occur. All care will be taken to prevent these side effects. If therapy is given over a long time, atrophy and wasting in the muscle injected may occur. Occasionally the patient's become refractory to treatment because they develop antibodies to the toxin. In this event, therapy needs to be modified.  I have read the above information and consent to the administration of botulism toxin.    BOTOX  PROCEDURE NOTE FOR MIGRAINE HEADACHE    Contraindications and precautions discussed with patient(above). Aseptic procedure was observed and patient tolerated procedure. Procedure performed by Dr. Criselda Dolly  The condition has existed for more than 6 months, and pt does not have a diagnosis of ALS, Myasthenia Gravis or Lambert-Eaton Syndrome.  Risks and benefits  of injections discussed and pt agrees to proceed with the procedure.  Written consent obtained  These injections are medically necessary. Pt  receives good benefits from these injections. These injections do not  cause sedations or hallucinations which the oral therapies may cause.  Description of procedure:  The patient was placed in a sitting position. The standard protocol was used for Botox  as follows, with 5 units of Botox  injected at each site:   -Procerus muscle, midline injection  -Corrugator muscle, bilateral injection  -Frontalis muscle, bilateral injection, with 2 sites each side, medial injection was performed in the upper one third of the frontalis muscle, in the region vertical from the medial inferior edge of the superior orbital rim. The lateral injection was again in the upper one third of the forehead vertically above the lateral limbus of the cornea, 1.5 cm lateral to the medial injection site.  -Temporalis muscle injection, 4 sites, bilaterally. The first injection was 3 cm above the tragus of the ear, second injection site was 1.5 cm to 3 cm up from the first injection site in line with the tragus of the ear. The third injection site was 1.5-3 cm forward between the first 2 injection sites. The fourth injection site was 1.5 cm posterior to the second injection site.   -Occipitalis muscle injection, 3 sites, bilaterally. The first injection was done one half way between the occipital protuberance and the tip of the mastoid process behind the ear. The second injection site was done lateral and superior to the first, 1 fingerbreadth from the first injection. The third injection site was 1 fingerbreadth superiorly and medially from the first injection site.  -Cervical paraspinal muscle injection, 2 sites, bilateral knee first injection site was 1 cm from the midline of the cervical spine, 3 cm inferior to the lower border of the occipital protuberance. The second injection site was 1.5 cm superiorly and laterally to the first injection site.  -Trapezius muscle injection was performed at 3 sites, bilaterally. The first injection site was in the upper trapezius muscle halfway between the  inflection point of the neck, and the acromion. The second injection site was one half way between the acromion and the first injection site. The third injection was done between the first injection site and the inflection point of the neck.   Will return for repeat injection in 3 months.   155 units of Botox  was used, 45u Botox  not injected was wasted. The patient tolerated the procedure well, there were no complications of the above procedure.

## 2023-05-11 ENCOUNTER — Encounter: Payer: Self-pay | Admitting: Neurology

## 2023-05-24 ENCOUNTER — Other Ambulatory Visit: Payer: Self-pay | Admitting: *Deleted

## 2023-05-24 NOTE — Telephone Encounter (Signed)
 Pt had asked on 05/10/23 if Dr Tresia Fruit can takeover sumatriptan  refills going forward. She currently has refills from Dr Joelle Musca. I checked the chart today. She may have one refill left from him.

## 2023-05-31 ENCOUNTER — Encounter: Payer: Self-pay | Admitting: Dermatology

## 2023-05-31 ENCOUNTER — Ambulatory Visit: Admitting: Dermatology

## 2023-05-31 DIAGNOSIS — Z7189 Other specified counseling: Secondary | ICD-10-CM

## 2023-05-31 DIAGNOSIS — D229 Melanocytic nevi, unspecified: Secondary | ICD-10-CM

## 2023-05-31 DIAGNOSIS — W908XXA Exposure to other nonionizing radiation, initial encounter: Secondary | ICD-10-CM | POA: Diagnosis not present

## 2023-05-31 DIAGNOSIS — L570 Actinic keratosis: Secondary | ICD-10-CM | POA: Diagnosis not present

## 2023-05-31 DIAGNOSIS — L814 Other melanin hyperpigmentation: Secondary | ICD-10-CM | POA: Diagnosis not present

## 2023-05-31 DIAGNOSIS — D2271 Melanocytic nevi of right lower limb, including hip: Secondary | ICD-10-CM

## 2023-05-31 DIAGNOSIS — Z1283 Encounter for screening for malignant neoplasm of skin: Secondary | ICD-10-CM

## 2023-05-31 DIAGNOSIS — L820 Inflamed seborrheic keratosis: Secondary | ICD-10-CM

## 2023-05-31 DIAGNOSIS — D2222 Melanocytic nevi of left ear and external auricular canal: Secondary | ICD-10-CM

## 2023-05-31 DIAGNOSIS — L821 Other seborrheic keratosis: Secondary | ICD-10-CM | POA: Diagnosis not present

## 2023-05-31 DIAGNOSIS — Z79899 Other long term (current) drug therapy: Secondary | ICD-10-CM

## 2023-05-31 DIAGNOSIS — Z85828 Personal history of other malignant neoplasm of skin: Secondary | ICD-10-CM

## 2023-05-31 DIAGNOSIS — Z86018 Personal history of other benign neoplasm: Secondary | ICD-10-CM

## 2023-05-31 DIAGNOSIS — L578 Other skin changes due to chronic exposure to nonionizing radiation: Secondary | ICD-10-CM | POA: Diagnosis not present

## 2023-05-31 MED ORDER — TRETINOIN 0.025 % EX CREA: TOPICAL_CREAM | CUTANEOUS | 11 refills | Status: AC

## 2023-05-31 NOTE — Progress Notes (Signed)
 Follow-Up Visit   Subjective  Jacqueline Meza is a 78 y.o. female who presents for the following: Skin Cancer Screening and Full Body Skin Exam. Hx of BCCs. Hx of dysplastic nevi.   Areas of concern at right jaw line and right lower leg. Raised, rough. Larger at times.   The patient presents for Total-Body Skin Exam (TBSE) for skin cancer screening and mole check. The patient has spots, moles and lesions to be evaluated, some may be new or changing and the patient may have concern these could be cancer.  The following portions of the chart were reviewed this encounter and updated as appropriate: medications, allergies, medical history  Review of Systems:  No other skin or systemic complaints except as noted in HPI or Assessment and Plan.  Objective  Well appearing patient in no apparent distress; mood and affect are within normal limits.  A full examination was performed including scalp, head, eyes, ears, nose, lips, neck, chest, axillae, abdomen, back, buttocks, bilateral upper extremities, bilateral lower extremities, hands, feet, fingers, toes, fingernails, and toenails. All findings within normal limits unless otherwise noted below.   Relevant physical exam findings are noted in the Assessment and Plan.  Exam of nails limited by presence of nail polish.  R Breast x1, R angle of mandible x4 (5) Erythematous keratotic or waxy stuck-on papule or plaque. Right pretibial x1 Erythematous thin papules/macules with gritty scale.         Assessment & Plan   SKIN CANCER SCREENING PERFORMED TODAY.  HISTORY OF BASAL CELL CARCINOMA OF THE SKIN - No evidence of recurrence today - Recommend regular full body skin exams - Recommend daily broad spectrum sunscreen SPF 30+ to sun-exposed areas, reapply every 2 hours as needed.  - Call if any new or changing lesions are noted between office visits  HISTORY OF DYSPLASTIC NEVI No evidence of recurrence today Recommend regular full  body skin exams Recommend daily broad spectrum sunscreen SPF 30+ to sun-exposed areas, reapply every 2 hours as needed.  Call if any new or changing lesions are noted between office visits   ACTINIC DAMAGE - Chronic condition, secondary to cumulative UV/sun exposure - diffuse scaly erythematous macules with underlying dyspigmentation - Recommend daily broad spectrum sunscreen SPF 30+ to sun-exposed areas, reapply every 2 hours as needed.  - Staying in the shade or wearing long sleeves, sun glasses (UVA+UVB protection) and wide brim hats (4-inch brim around the entire circumference of the hat) are also recommended for sun protection.  - Call for new or changing lesions.  LENTIGINES, SEBORRHEIC KERATOSES, HEMANGIOMAS - Benign normal skin lesions - Benign-appearing - Call for any changes  MELANOCYTIC NEVI - Tan-brown and/or pink-flesh-colored symmetric macules and papules - Benign appearing on exam today - Observation - Call clinic for new or changing moles - Recommend daily use of broad spectrum spf 30+ sunscreen to sun-exposed areas. - Check nails when remove polish. - Brown macule at left helix. Photo taken today.  - 0.3 cm brown macule at R plantar foot. Photo taken today  SEBORRHEIC KERATOSIS / LENTIGOS / Actinic Changes Face Exam: waxy brown macules face Treatment: Start Tretinoin  0.025% cr qhs to dark spots face Topical retinoid medications like tretinoin /Retin-A , adapalene/Differin, tazarotene/Fabior, and Epiduo/Epiduo Forte can cause dryness and irritation when first started. Only apply a pea-sized amount to the entire affected area. Avoid applying it around the eyes, edges of mouth and creases at the nose. If you experience irritation, use a good moisturizer first and/or apply  the medicine less often. If you are doing well with the medicine, you can increase how often you use it until you are applying every night. Be careful with sun protection while using this medication as it  can make you sensitive to the sun. This medicine should not be used by pregnant women.    INFLAMED SEBORRHEIC KERATOSIS (5) R Breast x1, R angle of mandible x4 (5) Symptomatic, irritating, patient would like treated. Destruction of lesion - R Breast x1, R angle of mandible x4 (5) Complexity: simple   Destruction method: cryotherapy   Informed consent: discussed and consent obtained   Timeout:  patient name, date of birth, surgical site, and procedure verified Lesion destroyed using liquid nitrogen: Yes   Region frozen until ice ball extended beyond lesion: Yes   Outcome: patient tolerated procedure well with no complications   Post-procedure details: wound care instructions given   AK (ACTINIC KERATOSIS) Right pretibial x1 Actinic keratoses are precancerous spots that appear secondary to cumulative UV radiation exposure/sun exposure over time. They are chronic with expected duration over 1 year. A portion of actinic keratoses will progress to squamous cell carcinoma of the skin. It is not possible to reliably predict which spots will progress to skin cancer and so treatment is recommended to prevent development of skin cancer.  Recommend daily broad spectrum sunscreen SPF 30+ to sun-exposed areas, reapply every 2 hours as needed.  Recommend staying in the shade or wearing long sleeves, sun glasses (UVA+UVB protection) and wide brim hats (4-inch brim around the entire circumference of the hat). Call for new or changing lesions. Destruction of lesion - Right pretibial x1 Complexity: simple   Destruction method: cryotherapy   Informed consent: discussed and consent obtained   Timeout:  patient name, date of birth, surgical site, and procedure verified Lesion destroyed using liquid nitrogen: Yes   Region frozen until ice ball extended beyond lesion: Yes   Outcome: patient tolerated procedure well with no complications   Post-procedure details: wound care instructions given   Additional  details:  Prior to procedure, discussed risks of blister formation, small wound, skin dyspigmentation, or rare scar following cryotherapy. Recommend Vaseline ointment to treated areas while healing.  Return in about 1 year (around 05/30/2024) for TBSE, HxBCC, HxDN.  I, Jill Parcell, CMA, am acting as scribe for Celine Collard, MD.   Documentation: I have reviewed the above documentation for accuracy and completeness, and I agree with the above.  Celine Collard, MD

## 2023-05-31 NOTE — Patient Instructions (Addendum)
 Continue Tretinoin  0.025% cr qhs to dark spots face   Topical retinoid medications like tretinoin /Retin-A , adapalene/Differin, tazarotene/Fabior, and Epiduo/Epiduo Forte can cause dryness and irritation when first started. Only apply a pea-sized amount to the entire affected area. Avoid applying it around the eyes, edges of mouth and creases at the nose. If you experience irritation, use a good moisturizer first and/or apply the medicine less often. If you are doing well with the medicine, you can increase how often you use it until you are applying every night. Be careful with sun protection while using this medication as it can make you sensitive to the sun. This medicine should not be used by pregnant women.     Cryotherapy Aftercare  Wash gently with soap and water everyday.   Apply Vaseline and Band-Aid daily until healed.    Recommend daily broad spectrum sunscreen SPF 30+ to sun-exposed areas, reapply every 2 hours as needed. Call for new or changing lesions.  Staying in the shade or wearing long sleeves, sun glasses (UVA+UVB protection) and wide brim hats (4-inch brim around the entire circumference of the hat) are also recommended for sun protection.     Melanoma ABCDEs  Melanoma is the most dangerous type of skin cancer, and is the leading cause of death from skin disease.  You are more likely to develop melanoma if you: Have light-colored skin, light-colored eyes, or red or blond hair Spend a lot of time in the sun Tan regularly, either outdoors or in a tanning bed Have had blistering sunburns, especially during childhood Have a close family member who has had a melanoma Have atypical moles or large birthmarks  Early detection of melanoma is key since treatment is typically straightforward and cure rates are extremely high if we catch it early.   The first sign of melanoma is often a change in a mole or a new dark spot.  The ABCDE system is a way of remembering the signs of  melanoma.  A for asymmetry:  The two halves do not match. B for border:  The edges of the growth are irregular. C for color:  A mixture of colors are present instead of an even brown color. D for diameter:  Melanomas are usually (but not always) greater than 6mm - the size of a pencil eraser. E for evolution:  The spot keeps changing in size, shape, and color.  Please check your skin once per month between visits. You can use a small mirror in front and a large mirror behind you to keep an eye on the back side or your body.   If you see any new or changing lesions before your next follow-up, please call to schedule a visit.  Please continue daily skin protection including broad spectrum sunscreen SPF 30+ to sun-exposed areas, reapplying every 2 hours as needed when you're outdoors.   Staying in the shade or wearing long sleeves, sun glasses (UVA+UVB protection) and wide brim hats (4-inch brim around the entire circumference of the hat) are also recommended for sun protection.      Due to recent changes in healthcare laws, you may see results of your pathology and/or laboratory studies on MyChart before the doctors have had a chance to review them. We understand that in some cases there may be results that are confusing or concerning to you. Please understand that not all results are received at the same time and often the doctors may need to interpret multiple results in order to provide you  with the best plan of care or course of treatment. Therefore, we ask that you please give us  2 business days to thoroughly review all your results before contacting the office for clarification. Should we see a critical lab result, you will be contacted sooner.   If You Need Anything After Your Visit  If you have any questions or concerns for your doctor, please call our main line at [phone] and press option 4 to reach your doctor's medical assistant. If no one answers, please leave a voicemail as  directed and we will return your call as soon as possible. Messages left after 4 pm will be answered the following business day.   You may also send us  a message via MyChart. We typically respond to MyChart messages within 1-2 business days.  For prescription refills, please ask your pharmacy to contact our office. Our fax number is [phone].  If you have an urgent issue when the clinic is closed that cannot wait until the next business day, you can page your doctor at the number below.    Please note that while we do our best to be available for urgent issues outside of office hours, we are not available 24/7.   If you have an urgent issue and are unable to reach us , you may choose to seek medical care at your doctor's office, retail clinic, urgent care center, or emergency room.  If you have a medical emergency, please immediately call 911 or go to the emergency department.  Pager Numbers  - Dr. Bary Likes: [phone]  - Dr. Annette Barters: [phone]  - Dr. Felipe Horton: [phone]   In the event of inclement weather, please call our main line at [phone] for an update on the status of any delays or closures.  Dermatology Medication Tips: Please keep the boxes that topical medications come in in order to help keep track of the instructions about where and how to use these. Pharmacies typically print the medication instructions only on the boxes and not directly on the medication tubes.   If your medication is too expensive, please contact our office at [phone] option 4 or send us  a message through MyChart.   We are unable to tell what your co-pay for medications will be in advance as this is different depending on your insurance coverage. However, we may be able to find a substitute medication at lower cost or fill out paperwork to get insurance to cover a needed medication.   If a prior authorization is required to get your medication covered by your insurance company, please  allow us  1-2 business days to complete this process.  Drug prices often vary depending on where the prescription is filled and some pharmacies may offer cheaper prices.  The website www.goodrx.com contains coupons for medications through different pharmacies. The prices here do not account for what the cost may be with help from insurance (it may be cheaper with your insurance), but the website can give you the price if you did not use any insurance.  - You can print the associated coupon and take it with your prescription to the pharmacy.  - You may also stop by our office during regular business hours and pick up a GoodRx coupon card.  - If you need your prescription sent electronically to a different pharmacy, notify our office through [facility] or by phone at [phone] option 4.     Si Usted Necesita Algo Despus de Su Visita  Tambin puede enviarnos un mensaje a  travs de MyChart. Por lo general respondemos a los mensajes de MyChart en el transcurso de 1 a 2 das hbiles.  Para renovar recetas, por favor pida a su farmacia que se ponga en contacto con nuestra oficina. Franz Jacks de fax es [place] [phone].  Si tiene un asunto urgente cuando la clnica est cerrada y que no puede esperar hasta el siguiente da hbil, puede llamar/localizar a su doctor(a) al nmero que aparece a continuacin.   Por favor, tenga en cuenta que aunque hacemos todo lo posible para estar disponibles para asuntos urgentes fuera del horario de [place], no estamos disponibles las 24 horas del da, los 7 [address]  [address] de la [place].   Si tiene un problema urgente y no puede comunicarse con nosotros, puede optar por buscar atencin mdica  en el consultorio de su doctor(a), en una clnica privada, en un centro de atencin urgente o en una sala de emergencias.  Si tiene Engineer, drilling, por favor llame inmediatamente al 911 o vaya a la sala de emergencias.  Nmeros de bper  - Dr. Bary Likes:  [phone]  - Dra. Annette Barters: [phone]  - Dr. Felipe Horton: [phone]   En caso de inclemencias del tiempo, por favor llame a Lajuan Pila principal al [phone] para una actualizacin sobre el [place] de cualquier retraso o cierre.  Consejos para la medicacin en dermatologa: Por favor, guarde las cajas en las que vienen los medicamentos de uso tpico para ayudarle a seguir las instrucciones sobre dnde y cmo usarlos. Las farmacias generalmente imprimen las instrucciones del medicamento slo en las cajas y no directamente en los tubos del [place].   Si su medicamento es muy caro, por favor, pngase en contacto con Bettyjane Brunet llamando al [phone] y presione la opcin 4 o envenos un mensaje a travs de Clinical cytogeneticist.   No podemos decirle cul ser su copago por los medicamentos por adelantado ya que esto es diferente dependiendo de la cobertura de su seguro. Sin embargo, es posible que podamos encontrar un medicamento sustituto a Audiological scientist un formulario para que el seguro cubra el medicamento que se considera necesario.   Si se requiere una autorizacin previa para que su compaa de seguros [place] su medicamento, por favor permtanos de 1 a 2 das hbiles para completar este proceso.  Los precios de los medicamentos varan con frecuencia dependiendo del Environmental consultant de dnde se surte la receta y alguna farmacias pueden ofrecer precios ms baratos.  El sitio web www.goodrx.com tiene cupones para medicamentos de Health and safety inspector. Los precios aqu no tienen en cuenta lo que podra costar con la ayuda del seguro (puede ser ms barato con su seguro), pero el sitio web puede darle el precio si no utiliz Tourist information centre manager.  - Puede imprimir el cupn correspondiente y llevarlo con su receta a la farmacia.  - Tambin puede pasar por nuestra oficina durante el horario de atencin regular y Education officer, museum una tarjeta de cupones de GoodRx.  - Si necesita que su receta se enve electrnicamente a  una farmacia diferente, informe a nuestra oficina a travs de MyChart de [place] o por telfono llamando al [phone] y presione la opcin 4.

## 2023-06-01 ENCOUNTER — Encounter: Payer: Self-pay | Admitting: Dermatology

## 2023-06-02 ENCOUNTER — Ambulatory Visit: Payer: Medicare PPO | Admitting: Dermatology

## 2023-07-01 ENCOUNTER — Ambulatory Visit: Payer: Medicare PPO | Admitting: Family

## 2023-07-01 ENCOUNTER — Encounter: Payer: Self-pay | Admitting: Family

## 2023-07-01 VITALS — BP 116/70 | HR 78 | Temp 97.6°F | Ht 62.0 in | Wt 133.6 lb

## 2023-07-01 DIAGNOSIS — Z1231 Encounter for screening mammogram for malignant neoplasm of breast: Secondary | ICD-10-CM

## 2023-07-01 DIAGNOSIS — Z1211 Encounter for screening for malignant neoplasm of colon: Secondary | ICD-10-CM | POA: Diagnosis not present

## 2023-07-01 DIAGNOSIS — Z13220 Encounter for screening for lipoid disorders: Secondary | ICD-10-CM | POA: Diagnosis not present

## 2023-07-01 DIAGNOSIS — G43709 Chronic migraine without aura, not intractable, without status migrainosus: Secondary | ICD-10-CM

## 2023-07-01 DIAGNOSIS — Z780 Asymptomatic menopausal state: Secondary | ICD-10-CM | POA: Diagnosis not present

## 2023-07-01 DIAGNOSIS — Z0000 Encounter for general adult medical examination without abnormal findings: Secondary | ICD-10-CM

## 2023-07-01 DIAGNOSIS — Z136 Encounter for screening for cardiovascular disorders: Secondary | ICD-10-CM

## 2023-07-01 NOTE — Progress Notes (Signed)
 Assessment & Plan:  Encounter for medical examination to establish care Assessment & Plan: Here to establish care.  Discussed mammogram and bone density.  Patient will obtain in March 2026.  Colonoscopy is due.  Referral to [facility] clinic.  She will continue following with neurology, dermatology.   Orders: -     CBC with Differential/Platelet; Future -     Comprehensive metabolic panel with GFR; Future -     TSH; Future  Asymptomatic postmenopausal state -     VITAMIN D  25 Hydroxy (Vit-D Deficiency, Fractures); Future -     TSH; Future  Encounter for screening mammogram for malignant neoplasm of breast -     3D Screening Mammogram, Left and Right; Future -     DG Bone Density; Future  Encounter for lipid screening for cardiovascular disease -     Hemoglobin A1c; Future -     Lipid panel; Future  Screen for colon cancer -     Ambulatory referral to Gastroenterology  Chronic migraine without aura without status migrainosus, not intractable Assessment & Plan: Chronic, stable.  No aura. No h/o CVA. Following with Dr. Tresia Fruit, [facility].  Continue  Emgality , Botox  injection as managed by neurology. she takes imitrex  for rescue, very rare use.        Return precautions given.   Risks, benefits, and alternatives of the medications and treatment plan prescribed today were discussed, and patient expressed understanding.   Education regarding symptom management and diagnosis given to patient on AVS either electronically or printed.  Return in about 1 year (around 06/30/2024) for Fasting labs in 2-3 weeks.  Bascom Bossier, FNP  Subjective:    Patient ID: Jacqueline Meza, female    DOB: [birth year], 78 y.o.   MRN: [id number]  Jacqueline Meza is a 78 y.o. female who presents today to establish care.    HPI: Feels well today.  No new complaints. Migraines are the best they have ever been.  She rarely has a migraine.  In the past she has been getting 2 headaches per  week.   Following with neurology for migraine.  Compliant Emgality , Botox  injection. She takes imitrex  for rescue, very rare use.     Denies aura, vision changes, numbness or confusion  No prior history of CVA  She also follows with dermatology, Dr. Elin Guan; h/o [facility]  Mammogram up-to-date 03/19/2023  Colonoscopy 06/05/2013, no polyp, repeat in 10 years  Paternal uncle passed from colon cancer    Allergies: Hydrocodone and Codeine sulfate Current Outpatient Medications on File Prior to Visit  Medication Sig Dispense Refill   Cranberry-Milk Thistle (LIVER & KIDNEY CLEANSER PO) Take by mouth.     cyanocobalamin  (VITAMIN B12) 1000 MCG tablet Take 1,000 mcg by mouth daily.     diclofenac  (VOLTAREN ) 75 MG EC tablet TAKE 1 TABLET BY MOUTH 2 TIMES A DAY 60 tablet 3   EMGALITY  120 MG/ML SOAJ INJECT 120 MG INTO THE SKIN EVERY 30 DAYS 1 mL 11   Emollient (COLLAGEN EX) Apply topically.     Magnesium Citrate 200 MG TABS Take 1 tablet by mouth daily.     METHYLENE BLUE PO Take 15 mg by mouth 2 (two) times daily.     SUMAtriptan  (IMITREX ) 100 MG tablet TAKE 1 TABLET BY MOUTH AT ONSET OF HEADACHE; MAY REPEAT 1 TABLET IN 2 HOURS IF NEEDED. 9 tablet 3   tretinoin  (RETIN-A ) 0.025 % cream Apply pea-sized amount to face at bedtime, wash off in morning. 20  g 11   Turmeric (QC TUMERIC COMPLEX) 500 MG CAPS Take 1 capsule by mouth daily at 6 (six) AM.     UNABLE TO FIND Take 4 capsules by mouth daily. Med Name: Nutrafol 4 capsules a day of hair growth nutraceutical and 1 capsule of hairbiotic     Current Facility-Administered Medications on File Prior to Visit  Medication Dose Route Frequency Provider Last Rate Last Admin   botulinum toxin Type A  (BOTOX ) injection 155 Units  155 Units Intramuscular Once Glory Larsen, MD        Review of Systems  Constitutional:  Negative for chills and fever.  Eyes:  Negative for visual disturbance.  Respiratory:  Negative for cough.   Cardiovascular:  Negative for  chest pain and palpitations.  Gastrointestinal:  Negative for nausea and vomiting.  Neurological:  Positive for headaches. Negative for dizziness.      Objective:    BP 116/70   Pulse 78   Temp 97.6 F (36.4 C) (Oral)   Ht 5' 2 (1.575 m)   Wt 133 lb 9.6 oz (60.6 kg)   SpO2 97%   BMI 24.44 kg/m  BP Readings from Last 3 Encounters:  07/01/23 116/70  03/17/23 110/80  01/18/23 130/80   Wt Readings from Last 3 Encounters:  07/01/23 133 lb 9.6 oz (60.6 kg)  03/17/23 133 lb 4 oz (60.4 kg)  01/18/23 131 lb 6 oz (59.6 kg)    Physical Exam Vitals reviewed.  Constitutional:      Appearance: She is well-developed.   Eyes:     Conjunctiva/sclera: Conjunctivae normal.   Neck:     Thyroid : No thyroid  mass or thyromegaly.   Cardiovascular:     Rate and Rhythm: Normal rate and regular rhythm.     Pulses: Normal pulses.     Heart sounds: Normal heart sounds.  Pulmonary:     Effort: Pulmonary effort is normal.     Breath sounds: Normal breath sounds. No wheezing, rhonchi or rales.  Lymphadenopathy:     Head:     Right side of head: No submental, submandibular, tonsillar, preauricular, posterior auricular or occipital adenopathy.     Left side of head: No submental, submandibular, tonsillar, preauricular, posterior auricular or occipital adenopathy.     Cervical: No cervical adenopathy.   Skin:    General: Skin is warm and dry.   Neurological:     Mental Status: She is alert.   Psychiatric:        Speech: Speech normal.        Behavior: Behavior normal.        Thought Content: Thought content normal.

## 2023-07-01 NOTE — Assessment & Plan Note (Signed)
 Chronic, stable.  No aura. No h/o CVA. Following with Dr. Tresia Fruit, [facility].  Continue  Emgality , Botox  injection as managed by neurology. she takes imitrex  for rescue, very rare use.

## 2023-07-01 NOTE — Patient Instructions (Signed)
 Please call  and schedule your 3D mammogram and /or bone density scan as we discussed. DUE 03/2024   [facility]  ( new location in 2023)  [address], [place], [place] [address]  [place], [place]  [phone]   Referral for colonoscopy  Let us  know if you dont hear back within a week in regards to an appointment being scheduled.   So that you are aware, if you are [facility] MyChart user , please pay attention to your MyChart messages as you may receive a MyChart message with a phone number to call and schedule this test/appointment own your own from our referral coordinator. This is a new process so I do not want you to miss this message.  If you are not a MyChart user, you will receive a phone call.    Nice to meet you!

## 2023-07-01 NOTE — Assessment & Plan Note (Signed)
 Here to establish care.  Discussed mammogram and bone density.  Patient will obtain in March 2026.  Colonoscopy is due.  Referral to [facility] clinic.  She will continue following with neurology, dermatology.

## 2023-07-15 ENCOUNTER — Encounter: Payer: Self-pay | Admitting: Neurology

## 2023-07-15 ENCOUNTER — Other Ambulatory Visit (INDEPENDENT_AMBULATORY_CARE_PROVIDER_SITE_OTHER)

## 2023-07-15 DIAGNOSIS — Z780 Asymptomatic menopausal state: Secondary | ICD-10-CM

## 2023-07-15 DIAGNOSIS — Z136 Encounter for screening for cardiovascular disorders: Secondary | ICD-10-CM

## 2023-07-15 DIAGNOSIS — Z0000 Encounter for general adult medical examination without abnormal findings: Secondary | ICD-10-CM | POA: Diagnosis not present

## 2023-07-15 DIAGNOSIS — Z13220 Encounter for screening for lipoid disorders: Secondary | ICD-10-CM | POA: Diagnosis not present

## 2023-07-15 LAB — CBC WITH DIFFERENTIAL/PLATELET
Basophils Absolute: 0 10*3/uL (ref 0.0–0.1)
Basophils Relative: 0.6 % (ref 0.0–3.0)
Eosinophils Absolute: 0.1 10*3/uL (ref 0.0–0.7)
Eosinophils Relative: 3.4 % (ref 0.0–5.0)
HCT: 40.4 % (ref 36.0–46.0)
Hemoglobin: 13.6 g/dL (ref 12.0–15.0)
Lymphocytes Relative: 34.5 % (ref 12.0–46.0)
Lymphs Abs: 1.5 10*3/uL (ref 0.7–4.0)
MCHC: 33.6 g/dL (ref 30.0–36.0)
MCV: 104.9 fl — ABNORMAL HIGH (ref 78.0–100.0)
Monocytes Absolute: 0.4 10*3/uL (ref 0.1–1.0)
Monocytes Relative: 9.4 % (ref 3.0–12.0)
Neutro Abs: 2.3 10*3/uL (ref 1.4–7.7)
Neutrophils Relative %: 52.1 % (ref 43.0–77.0)
Platelets: 188 10*3/uL (ref 150.0–400.0)
RBC: 3.85 Mil/uL — ABNORMAL LOW (ref 3.87–5.11)
RDW: 13.5 % (ref 11.5–15.5)
WBC: 4.4 10*3/uL (ref 4.0–10.5)

## 2023-07-15 LAB — COMPREHENSIVE METABOLIC PANEL WITH GFR
ALT: 20 U/L (ref 0–35)
AST: 23 U/L (ref 0–37)
Albumin: 4.3 g/dL (ref 3.5–5.2)
Alkaline Phosphatase: 62 U/L (ref 39–117)
BUN: 13 mg/dL (ref 6–23)
CO2: 29 meq/L (ref 19–32)
Calcium: 9.4 mg/dL (ref 8.4–10.5)
Chloride: 103 meq/L (ref 96–112)
Creatinine, Ser: 0.68 mg/dL (ref 0.40–1.20)
GFR: 84.58 mL/min (ref >60.00)
Glucose, Bld: 109 mg/dL — ABNORMAL HIGH (ref 70–99)
Potassium: 4.2 meq/L (ref 3.5–5.1)
Sodium: 139 meq/L (ref 135–145)
Total Bilirubin: 0.6 mg/dL (ref 0.2–1.2)
Total Protein: 7 g/dL (ref 6.0–8.3)

## 2023-07-15 LAB — LIPID PANEL
Cholesterol: 242 mg/dL — ABNORMAL HIGH (ref 0–200)
HDL: 57.7 mg/dL (ref >39.00)
LDL Cholesterol: 147 mg/dL — ABNORMAL HIGH (ref 0–99)
NonHDL: 184.45
Total CHOL/HDL Ratio: 4
Triglycerides: 185 mg/dL — ABNORMAL HIGH (ref 0.0–149.0)
VLDL: 37 mg/dL (ref 0.0–40.0)

## 2023-07-15 LAB — HEMOGLOBIN A1C: Hgb A1c MFr Bld: 5.3 % (ref 4.6–6.5)

## 2023-07-15 LAB — TSH: TSH: 3.79 u[IU]/mL (ref 0.35–5.50)

## 2023-07-15 LAB — VITAMIN D 25 HYDROXY (VIT D DEFICIENCY, FRACTURES): VITD: 54.96 ng/mL (ref 30.00–100.00)

## 2023-07-20 ENCOUNTER — Other Ambulatory Visit: Payer: Self-pay | Admitting: Family

## 2023-07-20 ENCOUNTER — Ambulatory Visit: Payer: Self-pay | Admitting: Family

## 2023-07-20 DIAGNOSIS — R899 Unspecified abnormal finding in specimens from other organs, systems and tissues: Secondary | ICD-10-CM

## 2023-07-20 DIAGNOSIS — E785 Hyperlipidemia, unspecified: Secondary | ICD-10-CM

## 2023-07-26 ENCOUNTER — Ambulatory Visit
Admission: RE | Admit: 2023-07-26 | Discharge: 2023-07-26 | Disposition: A | Discharge status: Home / Self Care | Source: Ambulatory Visit | Attending: Family | Admitting: Family

## 2023-07-26 DIAGNOSIS — E785 Hyperlipidemia, unspecified: Secondary | ICD-10-CM | POA: Insufficient documentation

## 2023-08-02 ENCOUNTER — Ambulatory Visit: Payer: Self-pay | Admitting: Family

## 2023-08-02 DIAGNOSIS — E785 Hyperlipidemia, unspecified: Secondary | ICD-10-CM

## 2023-08-03 ENCOUNTER — Ambulatory Visit: Payer: Self-pay

## 2023-08-03 NOTE — Progress Notes (Unsigned)
     Jannis Atkins T. Jameshia Hayashida, MD, CAQ Sports Medicine [facility] [address] [place], [address]  Phone: [phone]  FAX: [phone]  KEYONIA GLUTH - 78 y.o. female  MRN [id number]  Date of Birth: 13-Aug-1945  Date: 08/05/2023  PCP: Dineen Rollene MATSU, FNP  Referral: Dineen Rollene MATSU, FNP  No chief complaint on file.  Subjective:   Jacqueline Meza is a 78 y.o. very pleasant female patient with There is no height or weight on file to calculate BMI. who presents with the following:  The patient presents with ongoing back pain.  She is currently having right-sided back pain and sciatica on the right side.  She is having pain in the lower back and buttocks region radiating down the posterior aspect of the thigh to the knee.    Review of Systems is noted in the HPI, as appropriate  Objective:   There were no vitals taken for this visit.  GEN: No acute distress; alert,appropriate. PULM: Breathing comfortably in no respiratory distress PSYCH: Normally interactive.   Laboratory and Imaging Data:  Assessment and Plan:   ***

## 2023-08-03 NOTE — Telephone Encounter (Signed)
 FYI Only or Action Required?: FYI only for provider.  Patient was last seen in primary care on 07/01/2023 by Dineen Rollene MATSU, FNP.  Called Nurse Triage reporting Back Pain.  Symptoms began several days ago.  Interventions attempted: OTC medications: tylenol and Ice/heat application.  Symptoms are: intermittent lower back pain radiates down right thigh and knee gradually worsening.  Triage Disposition: See PCP When Office is Open (Within 3 Days) (overriding See HCP Within 4 Hours (Or PCP Triage))  Patient/caregiver understands and will follow disposition?:  Yes          Copied from CRM [id number]. Topic: Clinical - Red Word Triage >> Aug 03, 2023 11:42 AM Lavanda D wrote: Red Word that prompted transfer to Nurse Triage: Sciatic nerve on her right side is extremely painful, this has been going on since Friday night. Lower back/buttocks and down/around knee. Reason for Disposition  [1] SEVERE back pain (e.g., excruciating, unable to do any normal activities) AND [2] not improved 2 hours after pain medicine  Answer Assessment - Initial Assessment Questions 1. ONSET: When did the pain begin? (e.g., minutes, hours, days)     Friday night.  2. LOCATION: Where does it hurt? (upper, mid or lower back)     Lower back.  3. SEVERITY: How bad is the pain?  (e.g., Scale 1-10; mild, moderate, or severe)     9/10.   4. PATTERN: Is the pain constant? (e.g., yes, no; constant, intermittent)      Comes and goes.  5. RADIATION: Does the pain shoot into your legs or somewhere else?     Starts in buttocks and goes around inside of thigh down back of leg and to knee. Right side only.  6. CAUSE:  What do you think is causing the back pain?      Sciatic nerve.  7. BACK OVERUSE:  Any recent lifting of heavy objects, strenuous work or exercise?     Patient states she was out of town with her grandchildren and playing with them, riding in a golf cart/  8. MEDICINES: What have  you taken so far for the pain? (e.g., nothing, acetaminophen, NSAIDS)     Tylenol, heating pad  9. NEUROLOGIC SYMPTOMS: Do you have any weakness, numbness, or problems with bowel/bladder control?     No weakness, numbness, loss of bowel or bladder control.  10. OTHER SYMPTOMS: Do you have any other symptoms? (e.g., fever, abdomen pain, burning with urination, blood in urine)       No abdominal pain, fever, urinary symptoms.  11. PREGNANCY: Is there any chance you are pregnant? When was your last menstrual period?       N/A.  Protocols used: Back Pain-A-AH

## 2023-08-05 ENCOUNTER — Ambulatory Visit: Admitting: Family Medicine

## 2023-08-05 ENCOUNTER — Encounter: Payer: Self-pay | Admitting: Family Medicine

## 2023-08-05 VITALS — BP 130/80 | HR 68 | Temp 97.1°F | Ht 62.0 in | Wt 132.5 lb

## 2023-08-05 DIAGNOSIS — M5441 Lumbago with sciatica, right side: Secondary | ICD-10-CM

## 2023-08-05 MED ORDER — TIZANIDINE HCL 4 MG PO TABS: 4.0000 mg | ORAL_TABLET | Freq: Every evening | ORAL | 2 refills | Status: AC | PRN

## 2023-08-05 MED ORDER — DICLOFENAC SODIUM 75 MG PO TBEC: 75.0000 mg | DELAYED_RELEASE_TABLET | Freq: Two times a day (BID) | ORAL | 3 refills | Status: AC

## 2023-08-05 MED ORDER — PREDNISONE 20 MG PO TABS
ORAL_TABLET | ORAL | 0 refills | Status: DC
Start: 2023-08-05 — End: 2023-11-17

## 2023-08-06 ENCOUNTER — Encounter: Payer: Self-pay | Admitting: Family Medicine

## 2023-08-09 ENCOUNTER — Encounter: Payer: Self-pay | Admitting: Neurology

## 2023-08-09 ENCOUNTER — Ambulatory Visit: Admitting: Neurology

## 2023-08-09 VITALS — BP 110/62 | HR 99

## 2023-08-09 DIAGNOSIS — G43709 Chronic migraine without aura, not intractable, without status migrainosus: Secondary | ICD-10-CM

## 2023-08-09 MED ORDER — ONABOTULINUMTOXINA 200 UNITS IJ SOLR
155.0000 [IU] | Freq: Once | INTRAMUSCULAR | Status: AC
  Administered 2023-08-09: 165 [IU] via INTRAMUSCULAR

## 2023-08-09 NOTE — Progress Notes (Signed)
 Consent Form Botulism Toxin Injection For Chronic Migraine  08/09/2023: Lovely patient! See her identical twim sister. Stable. Sumatriptan  helps with the migraines. But it was total exhaustion due to her sister's husband's death and the heat. Patient feels that her migraines cause her jaw to ache in her jaw aching also can cause her migraines to worsen it is a trigger for migraines included 5 units in each masseter to see if that helps with migraine severity.  05/10/2023: Patient feels that her migraines cause her jaw to ache in her jaw aching also can cause her migraines to worsen it is a trigger for migraines included 5 units in each masseter to see if that helps with migraine severity.Sister is glenda spruill. Patient stays with dr ines for botox .  02/11/2023: Patient feels that her migraines cause her jaw to ache in her jaw aching also can cause her migraines to worsen it is a trigger for migraines included 15 units in each masseter to see if that helps with migraine severity.  11/10/2023: Sarah slack performed 07/06/2022: stable, doing great 04/13/2022: Decreased topiramate  to 100mg  qhs was bid. 2 migraines last 3 months, sumatriptan  works. Emgalityalso working.  01/19/2022; Stable, Still excellent response >> 75% improvement freq and severity. 10/19/2021: doing well still, stable 07/30/2021: stable 05/05/2021: stable 01/28/2021: hasn't had one migraine or headache tremendous improvement.   10/29/2020:  Still excellent response >> 75% improvement freq and severity. She has two 81 year old twin grandchildren, a boy and a girl, kindergarten.   Reviewed orally with patient, additionally signature is on file:  Botulism toxin has been approved by the Federal drug administration for treatment of chronic migraine. Botulism toxin does not cure chronic migraine and it may not be effective in some patients.  The administration of botulism toxin is accomplished by injecting a small amount of toxin into the  muscles of the neck and head. Dosage must be titrated for each individual. Any benefits resulting from botulism toxin tend to wear off after 3 months with a repeat injection required if benefit is to be maintained. Injections are usually done every 3-4 months with maximum effect peak achieved by about 2 or 3 weeks. Botulism toxin is expensive and you should be sure of what costs you will incur resulting from the injection.  The side effects of botulism toxin use for chronic migraine may include:   -Transient, and usually mild, facial weakness with facial injections  -Transient, and usually mild, head or neck weakness with head/neck injections  -Reduction or loss of forehead facial animation due to forehead muscle weakness  -Eyelid drooping  -Dry eye  -Pain at the site of injection or bruising at the site of injection  -Double vision  -Potential unknown long term risks  Contraindications: You should not have Botox  if you are pregnant, nursing, allergic to albumin, have an infection, skin condition, or muscle weakness at the site of the injection, or have myasthenia gravis, Lambert-Eaton syndrome, or ALS.  It is also possible that as with any injection, there may be an allergic reaction or no effect from the medication. Reduced effectiveness after repeated injections is sometimes seen and rarely infection at the injection site may occur. All care will be taken to prevent these side effects. If therapy is given over a long time, atrophy and wasting in the muscle injected may occur. Occasionally the patient's become refractory to treatment because they develop antibodies to the toxin. In this event, therapy needs to be modified.  I have read the  above information and consent to the administration of botulism toxin.    BOTOX  PROCEDURE NOTE FOR MIGRAINE HEADACHE    Contraindications and precautions discussed with patient(above). Aseptic procedure was observed and patient tolerated procedure.  Procedure performed by Dr. Andree Epp  The condition has existed for more than 6 months, and pt does not have a diagnosis of ALS, Myasthenia Gravis or Lambert-Eaton Syndrome.  Risks and benefits of injections discussed and pt agrees to proceed with the procedure.  Written consent obtained  These injections are medically necessary. Pt  receives good benefits from these injections. These injections do not cause sedations or hallucinations which the oral therapies may cause.  Description of procedure:  The patient was placed in a sitting position. The standard protocol was used for Botox  as follows, with 5 units of Botox  injected at each site:   -Procerus muscle, midline injection  -Corrugator muscle, bilateral injection  -Frontalis muscle, bilateral injection, with 2 sites each side, medial injection was performed in the upper one third of the frontalis muscle, in the region vertical from the medial inferior edge of the superior orbital rim. The lateral injection was again in the upper one third of the forehead vertically above the lateral limbus of the cornea, 1.5 cm lateral to the medial injection site.  -Temporalis muscle injection, 4 sites, bilaterally. The first injection was 3 cm above the tragus of the ear, second injection site was 1.5 cm to 3 cm up from the first injection site in line with the tragus of the ear. The third injection site was 1.5-3 cm forward between the first 2 injection sites. The fourth injection site was 1.5 cm posterior to the second injection site.   -Occipitalis muscle injection, 3 sites, bilaterally. The first injection was done one half way between the occipital protuberance and the tip of the mastoid process behind the ear. The second injection site was done lateral and superior to the first, 1 fingerbreadth from the first injection. The third injection site was 1 fingerbreadth superiorly and medially from the first injection site.  -Cervical paraspinal muscle  injection, 2 sites, bilateral knee first injection site was 1 cm from the midline of the cervical spine, 3 cm inferior to the lower border of the occipital protuberance. The second injection site was 1.5 cm superiorly and laterally to the first injection site.  -Trapezius muscle injection was performed at 3 sites, bilaterally. The first injection site was in the upper trapezius muscle halfway between the inflection point of the neck, and the acromion. The second injection site was one half way between the acromion and the first injection site. The third injection was done between the first injection site and the inflection point of the neck.   Will return for repeat injection in 3 months.   165 units of Botox  was used, 35u Botox  not injected was wasted. The patient tolerated the procedure well, there were no complications of the above procedure.

## 2023-08-09 NOTE — Progress Notes (Signed)
 Botox - 200 units x 1 vial Lot: [id number] Expiration: 10/2025 NDC: 9976-6078-97  Bacteriostatic 0.9% Sodium Chloride- 4 mL  Lot: [id number] Expiration: 10/2024 NDC: 9590-8033-97  Dx: H56.290 B/B Witnessed by Particia PEAK

## 2023-08-10 ENCOUNTER — Ambulatory Visit (INDEPENDENT_AMBULATORY_CARE_PROVIDER_SITE_OTHER): Admitting: *Deleted

## 2023-08-10 VITALS — Ht 62.0 in | Wt 129.0 lb

## 2023-08-10 DIAGNOSIS — Z0000 Encounter for general adult medical examination without abnormal findings: Secondary | ICD-10-CM

## 2023-08-10 NOTE — Progress Notes (Signed)
 Subjective:   Jacqueline Meza is a 78 y.o. who presents for a Medicare Wellness preventive visit.  As a reminder, Annual Wellness Visits don't include a physical exam, and some assessments may be limited, especially if this visit is performed virtually. We may recommend an in-person follow-up visit with your provider if needed.  Visit Complete: Virtual I connected with  Rock DELENA Mody on 08/10/23 by a audio enabled telemedicine application and verified that I am speaking with the correct person using two identifiers.  Patient Location: Home  Provider Location: Home Office  I discussed the limitations of evaluation and management by telemedicine. The patient expressed understanding and agreed to proceed.  Vital Signs: Because this visit was a virtual/telehealth visit, some criteria may be missing or patient reported. Any vitals not documented were not able to be obtained and vitals that have been documented are patient reported.  VideoDeclined- This patient declined Librarian, academic. Therefore the visit was completed with audio only.  Persons Participating in Visit: Patient.  AWV Questionnaire: No: Patient Medicare AWV questionnaire was not completed prior to this visit.  Cardiac Risk Factors include: advanced age (>109men, >30 women);dyslipidemia     Objective:    Today's Vitals   08/10/23 1517  Weight: 129 lb (58.5 kg)  Height: 5' 2 (1.575 m)   Body mass index is 23.59 kg/m.     08/10/2023    3:29 PM 02/02/2023    2:13 PM 07/01/2015   10:48 AM 06/04/2014    3:41 PM  Advanced Directives  Does Patient Have a Medical Advance Directive? Yes Yes Yes  Yes   Type of Estate agent of [place];Living will Healthcare Power of [place];Living will Healthcare Power of [place];Out of facility DNR (pink MOST or yellow form)  Out of facility DNR (pink MOST or yellow form)   Does patient want to make changes to medical advance  directive?  No - Patient declined No - Patient declined  Yes - information given   Copy of Healthcare Power of Attorney in Chart? No - copy requested No - copy requested Yes  Yes      Data saved with a previous flowsheet row definition    Current Medications (verified) Outpatient Encounter Medications as of 08/10/2023  Medication Sig   Cranberry-Milk Thistle (LIVER & KIDNEY CLEANSER PO) Take by mouth.   cyanocobalamin  (VITAMIN B12) 1000 MCG tablet Take 1,000 mcg by mouth daily.   diclofenac  (VOLTAREN ) 75 MG EC tablet Take 1 tablet (75 mg total) by mouth 2 (two) times daily. (Patient taking differently: Take 75 mg by mouth 2 (two) times daily as needed.)   EMGALITY  120 MG/ML SOAJ INJECT 120 MG INTO THE SKIN EVERY 30 DAYS   Emollient (COLLAGEN EX) Apply topically.   Magnesium Citrate 200 MG TABS Take 1 tablet by mouth daily.   METHYLENE BLUE PO Take 15 mg by mouth 2 (two) times daily.   predniSONE  (DELTASONE ) 20 MG tablet 2 tabs po daily for 5 days, then 1 tab po daily for 5 days   SUMAtriptan  (IMITREX ) 100 MG tablet TAKE 1 TABLET BY MOUTH AT ONSET OF HEADACHE; MAY REPEAT 1 TABLET IN 2 HOURS IF NEEDED.   tiZANidine  (ZANAFLEX ) 4 MG tablet Take 1 tablet (4 mg total) by mouth at bedtime as needed for muscle spasms.   tretinoin  (RETIN-A ) 0.025 % cream Apply pea-sized amount to face at bedtime, wash off in morning.   Turmeric (QC TUMERIC COMPLEX) 500 MG CAPS Take 1  capsule by mouth daily at 6 (six) AM.   UNABLE TO FIND Take 4 capsules by mouth daily. Med Name: Nutrafol 4 capsules a day of hair growth nutraceutical and 1 capsule of hairbiotic   Facility-Administered Encounter Medications as of 08/10/2023  Medication   botulinum toxin Type A  (BOTOX ) injection 155 Units    Allergies (verified) Hydrocodone and Codeine sulfate   History: Past Medical History:  Diagnosis Date   Actinic keratosis 12/12/2007   R leg - bx proven    Basal cell carcinoma 01/04/2007   L upper back - superficial     Basal cell carcinoma 11/08/2013   L ant scalp    Basal cell carcinoma 12/10/2014   L nasal tip    Basal cell carcinoma 06/30/2021   left nasal tip, schedule mohs   Dysplastic nevus 01/23/2008   R ant ankle - moderate   Dysplastic nevus 11/08/2012   R prox dorsum of foot - mild    Endometriosis    Hyperlipidemia    Migraines    since childhood   Raynaud's phenomenon    Past Surgical History:  Procedure Laterality Date   ABDOMINAL HYSTERECTOMY  01/12/1993   endometriosis and worsening migraines   CESAREAN SECTION  1988   COLONOSCOPY  2005   Dr Dessa   DILATION AND CURETTAGE OF UTERUS     done after miscarriages twice   MOHS SURGERY     Left Side of Nose   Family History  Problem Relation Age of Onset   Kidney failure Mother    Cancer Father        lymphoma   Bone cancer Father    Migraines Father    Migraines Sister    Alzheimer's disease Sister 11   Migraines Daughter    Cancer Paternal Aunt        breast   Breast cancer Paternal Aunt        late 5's   Migraines Paternal Aunt    Cancer Paternal Uncle 77       colon   Breast cancer Other    CAD Neg Hx    Stroke Neg Hx    Diabetes Neg Hx    Social History   Socioeconomic History   Marital status: Divorced    Spouse name: Not on file   Number of children: 1   Years of education: Not on file   Highest education level: Bachelor's degree (e.g., BA, AB, BS)  Occupational History   Occupation: Retired--curriculum facilitator    Comment: [employer]   Occupation: Teaching part time--math intervention    Comment: retired   Occupation: Lexicographer houses part time    Comment:    Tobacco Use   Smoking status: Never    Passive exposure: Past   Smokeless tobacco: Never  Vaping Use   Vaping status: Never Used  Substance and Sexual Activity   Alcohol use: Yes    Alcohol/week: 7.0 standard drinks of alcohol    Types: 7 Standard drinks or equivalent per week    Comment: 1 drink a night, wine or  vodka sometimes   Drug use: No   Sexual activity: Never  Other Topics Concern   Not on file  Social History Narrative   Has living will   Requests daughter Addy as health care POA.   Requests DNR after discussion --form done 04/22/12   No tube feedings if cognitively unaware         Update 09/26/2019   Lives alone  Right handed   Caffeine: 1 cup/day      Curriculum specialist for [employer]      She has 3 siblings   Social Drivers of Corporate investment banker Strain: Low Risk  (08/10/2023)   Overall Financial Resource Strain (CARDIA)    Difficulty of Paying Living Expenses: Not hard at all  Food Insecurity: No Food Insecurity (08/10/2023)   Hunger Vital Sign    Worried About Running Out of Food in the Last Year: Never true    Ran Out of Food in the Last Year: Never true  Transportation Needs: No Transportation Needs (08/10/2023)   PRAPARE - Administrator, Civil Service (Medical): No    Lack of Transportation (Non-Medical): No  Physical Activity: Sufficiently Active (08/10/2023)   Exercise Vital Sign    Days of Exercise per Week: 5 days    Minutes of Exercise per Session: 30 min  Recent Concern: Physical Activity - Insufficiently Active (08/03/2023)   Exercise Vital Sign    Days of Exercise per Week: 3 days    Minutes of Exercise per Session: 20 min  Stress: No Stress Concern Present (08/10/2023)   [employer] of Occupational Health - Occupational Stress Questionnaire    Feeling of Stress: Only a little  Social Connections: Moderately Isolated (08/10/2023)   Social Connection and Isolation Panel    Frequency of Communication with Friends and Family: More than three times a week    Frequency of Social Gatherings with Friends and Family: More than three times a week    Attends Religious Services: More than 4 times per year    Active Member of [employer] or Organizations: No    Attends Banker Meetings: Never    Marital Status:  Divorced    Tobacco Counseling Counseling given: Not Answered    Clinical Intake:  Pre-visit preparation completed: Yes  Pain : No/denies pain     BMI - recorded: 23.59 Nutritional Status: BMI of 19-24  Normal Nutritional Risks: None Diabetes: No  Lab Results  Component Value Date   HGBA1C 5.3 07/15/2023     How often do you need to have someone help you when you read instructions, pamphlets, or other written materials from your doctor or pharmacy?: 1 - Never  Interpreter Needed?: No  Information entered by :: R. Tijah Hane :LPN   Activities of Daily Living     08/10/2023    3:19 PM  In your present state of health, do you have any difficulty performing the following activities:  Hearing? 1  Vision? 0  Difficulty concentrating or making decisions? 0  Walking or climbing stairs? 0  Dressing or bathing? 0  Doing errands, shopping? 0  Preparing Food and eating ? N  Using the Toilet? N  In the past six months, have you accidently leaked urine? N  Do you have problems with loss of bowel control? N  Managing your Medications? N  Managing your Finances? N  Housekeeping or managing your Housekeeping? N    Patient Care Team: Dineen Rollene MATSU, FNP as PCP - General (Family Medicine) Dwayne Bohr, DDS as Consulting Physician (Dentistry) Rocky Mace, OD as Consulting Physician (Optometry)  I have updated your Care Teams any recent Medical Services you may have received from other providers in the past year.     Assessment:   This is a routine wellness examination for [place].  Hearing/Vision screen Hearing Screening - Comments:: Some issues, no aids Vision Screening -  Comments:: Glasses and contacts   Goals Addressed             This Visit's Progress    Patient Stated       Wants to lose some weight        Depression Screen     08/10/2023    3:25 PM 07/01/2023    9:23 AM 03/17/2023   10:56 AM 01/28/2022    9:38 AM 01/28/2022    8:59 AM 01/26/2020    10:49 AM 01/23/2019   10:58 AM  PHQ 2/9 Scores  PHQ - 2 Score 0 0 0 0 0 0 0  PHQ- 9 Score 0 0         Fall Risk     08/10/2023    3:21 PM 07/01/2023    9:14 AM 01/28/2022    9:38 AM 01/28/2022    8:59 AM 01/26/2020   10:49 AM  Fall Risk   Falls in the past year? 0 0 0 0 0  Number falls in past yr: 0 0  0   Injury with Fall? 0 0  0   Risk for fall due to : No Fall Risks No Fall Risks  No Fall Risks   Follow up Falls evaluation completed;Falls prevention discussed Falls evaluation completed  Falls evaluation completed       Data saved with a previous flowsheet row definition    MEDICARE RISK AT HOME:  Medicare Risk at Home Any stairs in or around the home?: Yes If so, are there any without handrails?: No Home free of loose throw rugs in walkways, pet beds, electrical cords, etc?: Yes Adequate lighting in your home to reduce risk of falls?: Yes Life alert?: No Use of a cane, walker or w/c?: No Grab bars in the bathroom?: No Shower chair or bench in shower?: No Elevated toilet seat or a handicapped toilet?: No  TIMED UP AND GO:  Was the test performed?  No  Cognitive Function: 6CIT completed    07/01/2015   10:52 AM  MMSE - Mini Mental State Exam  Orientation to time 5   Orientation to Place 5   Registration 3   Attention/ Calculation 0   Recall 3   Language- name 2 objects 0   Language- repeat 1  Language- follow 3 step command 3   Language- read & follow direction 0   Write a sentence 0   Copy design 0   Total score 20      Data saved with a previous flowsheet row definition        08/10/2023    3:30 PM  6CIT Screen  What Year? 0 points  What month? 0 points  What time? 0 points  Count back from 20 0 points  Months in reverse 0 points  Repeat phrase 0 points  Total Score 0 points    Immunizations Immunization History  Administered Date(s) Administered   Fluad Quad(high Dose 65+) 09/29/2018, 10/17/2021   Influenza Whole 10/27/2007   Influenza, High  Dose Seasonal PF 10/23/2022   Influenza,inj,Quad PF,6+ Mos 11/28/2012, 01/18/2018   Influenza-Unspecified 10/12/2013, 10/12/2016, 10/03/2019, 11/12/2020   PFIZER(Purple Top)SARS-COV-2 Vaccination 02/19/2019, 03/15/2019, 11/21/2019   Pneumococcal Conjugate-13 04/24/2013   Pneumococcal Polysaccharide-23 04/22/2012   Respiratory Syncytial Virus Vaccine,Recomb Aduvanted(Arexvy) 10/23/2022   Td 10/27/2007   Tdap 01/23/2019   Zoster, Live 01/08/2012    Screening Tests Health Maintenance  Topic Date Due   Zoster Vaccines- Shingrix (1 of 2) 01/29/1966   COVID-19 Vaccine (4 -  2024-25 season) 09/13/2022   Medicare Annual Wellness (AWV)  01/29/2023   INFLUENZA VACCINE  08/13/2023   DTaP/Tdap/Td (3 - Td or Tdap) 01/22/2029   Pneumococcal Vaccine: 50+ Years  Completed   DEXA SCAN  Completed   Hepatitis C Screening  Completed   Hepatitis B Vaccines  Aged Out   HPV VACCINES  Aged Out   Meningococcal B Vaccine  Aged Out   Colonoscopy  Discontinued    Health Maintenance  Health Maintenance Due  Topic Date Due   Zoster Vaccines- Shingrix (1 of 2) 01/29/1966   COVID-19 Vaccine (4 - 2024-25 season) 09/13/2022   Medicare Annual Wellness (AWV)  01/29/2023   Health Maintenance Items Addressed: Discussed the need to update shingles vaccines. Patient declines covid vaccine. Patient was reminded to call and schedule Dexa.  Patient has a GI consult scheduled for colonoscopy at [facility] 11/09/23  Additional Screening:  Vision Screening: Recommended annual ophthalmology exams for early detection of glaucoma and other disorders of the eye.  Up to date [facility] Would you like a referral to an eye doctor? No    Dental Screening: Recommended annual dental exams for proper oral hygiene  Community Resource Referral / Chronic Care Management: CRR required this visit?  No   CCM required this visit?  No   Plan:    I have personally reviewed and noted the following in the patient's chart:    Medical and social history Use of alcohol, tobacco or illicit drugs  Current medications and supplements including opioid prescriptions. Patient is not currently taking opioid prescriptions. Functional ability and status Nutritional status Physical activity Advanced directives List of other physicians Hospitalizations, surgeries, and ER visits in previous 12 months Vitals Screenings to include cognitive, depression, and falls Referrals and appointments  In addition, I have reviewed and discussed with patient certain preventive protocols, quality metrics, and best practice recommendations. A written personalized care plan for preventive services as well as general preventive health recommendations were provided to patient.   Angeline Fredericks, LPN   [id number]/[id number]   After Visit Summary: (MyChart) Due to this being a telephonic visit, the after visit summary with patients personalized plan was offered to patient via MyChart   Notes: Nothing significant to report at this time.

## 2023-08-10 NOTE — Patient Instructions (Signed)
 Jacqueline Meza , Thank you for taking time out of your busy schedule to complete your Annual Wellness Visit with me. I enjoyed our conversation and look forward to speaking with you again next year. I, as well as your care team,  appreciate your ongoing commitment to your health goals. Please review the following plan we discussed and let me know if I can assist you in the future. Your Game plan/ To Do List    Referrals: If you haven't heard from the office you've been referred to, please reach out to them at the phone provided.  Remember to call and schedule your Dexa scan.  Consider updating your shingles vaccine.  Follow up Visits: Next Medicare AWV with our clinical staff: 08/14/24 @ 9:30   Have you seen your provider in the last 6 months (3 months if uncontrolled diabetes)? Yes Next Office Visit with your provider: 07/04/23  Clinician Recommendations:  Aim for 30 minutes of exercise or brisk walking, 6-8 glasses of water, and 5 servings of fruits and vegetables each day.       This is a list of the screening recommended for you and due dates:  Health Maintenance  Topic Date Due   Zoster (Shingles) Vaccine (1 of 2) 01/29/1966   COVID-19 Vaccine (4 - 2024-25 season) 09/13/2022   Flu Shot  08/13/2023   Mammogram  03/18/2024   Medicare Annual Wellness Visit  08/09/2024   DTaP/Tdap/Td vaccine (3 - Td or Tdap) 01/22/2029   Pneumococcal Vaccine for age over 22  Completed   DEXA scan (bone density measurement)  Completed   Hepatitis C Screening  Completed   Hepatitis B Vaccine  Aged Out   HPV Vaccine  Aged Out   Meningitis B Vaccine  Aged Out   Colon Cancer Screening  Discontinued    Advanced directives: (Copy Requested) Please bring a copy of your health care power of attorney and living will to the office to be added to your chart at your convenience. You can mail to [facility] [address] [place], [place] [address] or email to [email or web address] Advance Care  Planning is important because it:  [x]  Makes sure you receive the medical care that is consistent with your values, goals, and preferences  [x]  It provides guidance to your family and loved ones and reduces their decisional burden about whether or not they are making the right decisions based on your wishes.

## 2023-08-13 ENCOUNTER — Ambulatory Visit

## 2023-08-16 ENCOUNTER — Encounter: Payer: Self-pay | Admitting: Neurology

## 2023-08-31 ENCOUNTER — Encounter: Payer: Self-pay | Admitting: Neurology

## 2023-09-15 ENCOUNTER — Other Ambulatory Visit

## 2023-09-16 ENCOUNTER — Other Ambulatory Visit (INDEPENDENT_AMBULATORY_CARE_PROVIDER_SITE_OTHER)

## 2023-09-16 DIAGNOSIS — R899 Unspecified abnormal finding in specimens from other organs, systems and tissues: Secondary | ICD-10-CM

## 2023-09-16 LAB — CBC WITH DIFFERENTIAL/PLATELET
Basophils Absolute: 0 K/uL (ref 0.0–0.1)
Basophils Relative: 0.3 % (ref 0.0–3.0)
Eosinophils Absolute: 0.1 K/uL (ref 0.0–0.7)
Eosinophils Relative: 2.7 % (ref 0.0–5.0)
HCT: 41.5 % (ref 36.0–46.0)
Hemoglobin: 13.9 g/dL (ref 12.0–15.0)
Lymphocytes Relative: 37.6 % (ref 12.0–46.0)
Lymphs Abs: 1.8 K/uL (ref 0.7–4.0)
MCHC: 33.6 g/dL (ref 30.0–36.0)
MCV: 105.3 fl — ABNORMAL HIGH (ref 78.0–100.0)
Monocytes Absolute: 0.5 K/uL (ref 0.1–1.0)
Monocytes Relative: 11 % (ref 3.0–12.0)
Neutro Abs: 2.3 K/uL (ref 1.4–7.7)
Neutrophils Relative %: 48.4 % (ref 43.0–77.0)
Platelets: 192 K/uL (ref 150.0–400.0)
RBC: 3.94 Mil/uL (ref 3.87–5.11)
RDW: 12.9 % (ref 11.5–15.5)
WBC: 4.8 K/uL (ref 4.0–10.5)

## 2023-09-21 ENCOUNTER — Ambulatory Visit: Payer: Self-pay | Admitting: Family

## 2023-09-22 ENCOUNTER — Other Ambulatory Visit: Payer: Self-pay | Admitting: Family

## 2023-09-22 DIAGNOSIS — R899 Unspecified abnormal finding in specimens from other organs, systems and tissues: Secondary | ICD-10-CM

## 2023-09-22 NOTE — Telephone Encounter (Signed)
 noted

## 2023-10-01 ENCOUNTER — Inpatient Hospital Stay

## 2023-10-01 ENCOUNTER — Encounter: Payer: Self-pay | Admitting: Oncology

## 2023-10-01 ENCOUNTER — Inpatient Hospital Stay: Attending: Oncology | Admitting: Oncology

## 2023-10-01 VITALS — BP 126/77 | HR 81 | Temp 97.2°F | Resp 18 | Ht 62.0 in | Wt 132.7 lb

## 2023-10-01 DIAGNOSIS — D7589 Other specified diseases of blood and blood-forming organs: Secondary | ICD-10-CM | POA: Diagnosis not present

## 2023-10-01 DIAGNOSIS — Z803 Family history of malignant neoplasm of breast: Secondary | ICD-10-CM | POA: Diagnosis not present

## 2023-10-01 DIAGNOSIS — Z800 Family history of malignant neoplasm of digestive organs: Secondary | ICD-10-CM | POA: Diagnosis not present

## 2023-10-01 NOTE — Progress Notes (Signed)
 Hematology/Oncology Consult note [facility] [phone] [phone]  Patient Care Team: Dineen Rollene MATSU, FNP as PCP - General (Family Medicine) Dwayne Bohr, DDS as Consulting Physician (Dentistry) Rocky Mace, OD as Consulting Physician (Optometry)   Name of the patient: Jacqueline Meza  [id number]  [birth year]    Reason for referral-macrocytosis   Referring physician-Margaret Dineen, FNP  Date of visit: 10/01/23   History of presenting illness-patient is a 78 year old female with a past medical history significant for chronic migraine, hyperlipidemia, Raynaud's phenomena who has been Referred for macrocytosis.  Most recent CBC from 09/16/2023 showed white cell count of 4.8, H&H of 13.9/41.5 with an MCV of 105.3 and a platelet count of 192.  Looking back at her prior CBCs patient has known to have chronic macrocytosis at least dating back to last 14 years and her MCV has always been between 105-108.  TSH in July 2025 was normal.  B12 and folate was last checked in January 2025 was unremarkable.  Patient presently feels well and denies any changes in her appetite or weight.  Denies any unintentional weight loss.  ECOG PS- 0  Pain scale- 0   Review of systems- Review of Systems  Constitutional:  Negative for chills, fever, malaise/fatigue and weight loss.  HENT:  Negative for congestion, ear discharge and nosebleeds.   Eyes:  Negative for blurred vision.  Respiratory:  Negative for cough, hemoptysis, sputum production, shortness of breath and wheezing.   Cardiovascular:  Negative for chest pain, palpitations, orthopnea and claudication.  Gastrointestinal:  Negative for abdominal pain, blood in stool, constipation, diarrhea, heartburn, melena, nausea and vomiting.  Genitourinary:  Negative for dysuria, flank pain, frequency, hematuria and urgency.  Musculoskeletal:  Negative for back pain, joint pain and myalgias.  Skin:  Negative for  rash.  Neurological:  Negative for dizziness, tingling, focal weakness, seizures, weakness and headaches.  Endo/Heme/Allergies:  Does not bruise/bleed easily.  Psychiatric/Behavioral:  Negative for depression and suicidal ideas. The patient does not have insomnia.     Allergies  Allergen Reactions   Hydrocodone Other (See Comments)   Codeine Sulfate     REACTION: headache and nausea    Patient Active Problem List   Diagnosis Date Noted   Sleep disturbance 01/28/2022   Advance directive discussed with patient 06/04/2014   Hyperlipidemia    Encounter for medical examination to establish care 04/11/2010   Chronic migraine without aura without status migrainosus, not intractable 10/27/2007     Past Medical History:  Diagnosis Date   Actinic keratosis 12/12/2007   R leg - bx proven    Basal cell carcinoma 01/04/2007   L upper back - superficial    Basal cell carcinoma 11/08/2013   L ant scalp    Basal cell carcinoma 12/10/2014   L nasal tip    Basal cell carcinoma 06/30/2021   left nasal tip, schedule mohs   Dysplastic nevus 01/23/2008   R ant ankle - moderate   Dysplastic nevus 11/08/2012   R prox dorsum of foot - mild    Endometriosis    Hyperlipidemia    Migraines    since childhood   Raynaud's phenomenon      Past Surgical History:  Procedure Laterality Date   ABDOMINAL HYSTERECTOMY  01/12/1993   endometriosis and worsening migraines   CESAREAN SECTION  1988   COLONOSCOPY  2005   Dr Dessa   DILATION AND CURETTAGE OF UTERUS     done after miscarriages twice   MOHS  SURGERY     Left Side of Nose    Social History   Socioeconomic History   Marital status: Divorced    Spouse name: Not on file   Number of children: 1   Years of education: Not on file   Highest education level: Bachelor's degree (e.g., BA, AB, BS)  Occupational History   Occupation: Retired--curriculum facilitator    Comment: [employer]   Occupation: Teaching part  time--math intervention    Comment: retired   Occupation: Lexicographer houses part time    Comment:    Tobacco Use   Smoking status: Never    Passive exposure: Past   Smokeless tobacco: Never  Vaping Use   Vaping status: Never Used  Substance and Sexual Activity   Alcohol use: Yes    Alcohol/week: 7.0 standard drinks of alcohol    Types: 7 Standard drinks or equivalent per week    Comment: 1 drink a night, wine or vodka sometimes   Drug use: No   Sexual activity: Never  Other Topics Concern   Not on file  Social History Narrative   Has living will   Requests daughter Addy as health care POA.   Requests DNR after discussion --form done 04/22/12   No tube feedings if cognitively unaware         Update 09/26/2019   Lives alone    Right handed   Caffeine: 1 cup/day      Curriculum specialist for [employer]      She has 3 siblings   Social Drivers of Corporate investment banker Strain: Low Risk  (08/10/2023)   Overall Financial Resource Strain (CARDIA)    Difficulty of Paying Living Expenses: Not hard at all  Food Insecurity: No Food Insecurity (10/01/2023)   Hunger Vital Sign    Worried About Running Out of Food in the Last Year: Never true    Ran Out of Food in the Last Year: Never true  Transportation Needs: No Transportation Needs (10/01/2023)   PRAPARE - Administrator, Civil Service (Medical): No    Lack of Transportation (Non-Medical): No  Physical Activity: Sufficiently Active (08/10/2023)   Exercise Vital Sign    Days of Exercise per Week: 5 days    Minutes of Exercise per Session: 30 min  Recent Concern: Physical Activity - Insufficiently Active (08/03/2023)   Exercise Vital Sign    Days of Exercise per Week: 3 days    Minutes of Exercise per Session: 20 min  Stress: No Stress Concern Present (08/10/2023)   [employer] of Occupational Health - Occupational Stress Questionnaire    Feeling of Stress: Only a little  Social Connections:  Moderately Isolated (08/10/2023)   Social Connection and Isolation Panel    Frequency of Communication with Friends and Family: More than three times a week    Frequency of Social Gatherings with Friends and Family: More than three times a week    Attends Religious Services: More than 4 times per year    Active Member of [employer] or Organizations: No    Attends Banker Meetings: Never    Marital Status: Divorced  Catering manager Violence: Not At Risk (10/01/2023)   Humiliation, Afraid, Rape, and Kick questionnaire    Fear of Current or Ex-Partner: No    Emotionally Abused: No    Physically Abused: No    Sexually Abused: No     Family History  Problem Relation Age of Onset  Kidney failure Mother    Cancer Father        lymphoma   Bone cancer Father    Migraines Father    Migraines Sister    Alzheimer's disease Sister 64   Migraines Daughter    Cancer Paternal Aunt        breast   Breast cancer Paternal Aunt        late 77's   Migraines Paternal Aunt    Cancer Paternal Uncle 28       colon   Breast cancer Other    CAD Neg Hx    Stroke Neg Hx    Diabetes Neg Hx      Current Outpatient Medications:    Cranberry-Milk Thistle (LIVER & KIDNEY CLEANSER PO), Take by mouth., Disp: , Rfl:    cyanocobalamin  (VITAMIN B12) 1000 MCG tablet, Take 1,000 mcg by mouth daily., Disp: , Rfl:    diclofenac  (VOLTAREN ) 75 MG EC tablet, Take 1 tablet (75 mg total) by mouth 2 (two) times daily. (Patient taking differently: Take 75 mg by mouth 2 (two) times daily as needed.), Disp: 60 tablet, Rfl: 3   EMGALITY  120 MG/ML SOAJ, INJECT 120 MG INTO THE SKIN EVERY 30 DAYS, Disp: 1 mL, Rfl: 11   Magnesium Citrate 200 MG TABS, Take 1 tablet by mouth daily., Disp: , Rfl:    METHYLENE BLUE PO, Take 15 mg by mouth 2 (two) times daily., Disp: , Rfl:    SUMAtriptan  (IMITREX ) 100 MG tablet, TAKE 1 TABLET BY MOUTH AT ONSET OF HEADACHE; MAY REPEAT 1 TABLET IN 2 HOURS IF NEEDED., Disp: 9 tablet, Rfl:  3   tretinoin  (RETIN-A ) 0.025 % cream, Apply pea-sized amount to face at bedtime, wash off in morning., Disp: 20 g, Rfl: 11   Turmeric (QC TUMERIC COMPLEX) 500 MG CAPS, Take 1 capsule by mouth daily at 6 (six) AM., Disp: , Rfl:    Emollient (COLLAGEN EX), Apply topically. (Patient not taking: Reported on 10/01/2023), Disp: , Rfl:    predniSONE  (DELTASONE ) 20 MG tablet, 2 tabs po daily for 5 days, then 1 tab po daily for 5 days, Disp: 15 tablet, Rfl: 0   tiZANidine  (ZANAFLEX ) 4 MG tablet, Take 1 tablet (4 mg total) by mouth at bedtime as needed for muscle spasms., Disp: 30 tablet, Rfl: 2   UNABLE TO FIND, Take 4 capsules by mouth daily. Med Name: Nutrafol 4 capsules a day of hair growth nutraceutical and 1 capsule of hairbiotic, Disp: , Rfl:   Current Facility-Administered Medications:    botulinum toxin Type A  (BOTOX ) injection 155 Units, 155 Units, Intramuscular, Once, Ines Onetha NOVAK, MD   Physical exam:  Vitals:   10/01/23 1052  BP: 126/77  Pulse: 81  Resp: 18  Temp: (!) 97.2 F (36.2 C)  TempSrc: Tympanic  SpO2: 98%  Weight: 132 lb 11.2 oz (60.2 kg)  Height: 5' 2 (1.575 m)   Physical Exam Cardiovascular:     Rate and Rhythm: Normal rate and regular rhythm.     Heart sounds: Normal heart sounds.  Pulmonary:     Effort: Pulmonary effort is normal.     Breath sounds: Normal breath sounds.  Abdominal:     General: Bowel sounds are normal.     Palpations: Abdomen is soft.  Lymphadenopathy:     Comments: No palpable cervical, supraclavicular, axillary or inguinal adenopathy    Skin:    General: Skin is warm and dry.  Neurological:     Mental Status: She  is alert and oriented to person, place, and time.           Latest Ref Rng & Units 07/15/2023    9:16 AM  CMP  Glucose 70 - 99 mg/dL 890   BUN 6 - 23 mg/dL 13   Creatinine 9.59 - 1.20 mg/dL 9.31   Sodium 864 - 854 mEq/L 139   Potassium 3.5 - 5.1 mEq/L 4.2   Chloride 96 - 112 mEq/L 103   CO2 19 - 32 mEq/L 29    Calcium 8.4 - 10.5 mg/dL 9.4   Total Protein 6.0 - 8.3 g/dL 7.0   Total Bilirubin 0.2 - 1.2 mg/dL 0.6   Alkaline Phos 39 - 117 U/L 62   AST 0 - 37 U/L 23   ALT 0 - 35 U/L 20       Latest Ref Rng & Units 09/16/2023    8:37 AM  CBC  WBC 4.0 - 10.5 K/uL 4.8   Hemoglobin 12.0 - 15.0 g/dL 86.0   Hematocrit 63.9 - 46.0 % 41.5   Platelets 150.0 - 400.0 K/uL 192.0      Assessment and plan- Patient is a 78 y.o. female referred for macrocytosis  Patient has had long standing macrocytosis for last 14 years. No evidence of anemia. No other cytopenias. No B symptoms. B12, folate, tsh normal. Etiology unclear. Regardless she does not require bone marrow biopsy at this time. She does not reqwuire any f/u at this time.    Thank you for this kind referral and the opportunity to participate in the care of this patient   Visit Diagnosis 1. Macrocytosis     Dr. Annah Skene, MD, MPH [facility] [phone] 10/01/2023

## 2023-10-03 ENCOUNTER — Encounter: Payer: Self-pay | Admitting: Family

## 2023-10-03 DIAGNOSIS — D7589 Other specified diseases of blood and blood-forming organs: Secondary | ICD-10-CM | POA: Insufficient documentation

## 2023-10-07 ENCOUNTER — Other Ambulatory Visit: Payer: Self-pay

## 2023-10-07 ENCOUNTER — Other Ambulatory Visit: Payer: Self-pay | Admitting: Pharmacy Technician

## 2023-10-07 MED ORDER — BOTOX 200 UNITS IJ SOLR
200.0000 [IU] | INTRAMUSCULAR | 2 refills | Status: AC
  Filled 2023-10-07 – 2023-10-18 (×2): qty 1, 90d supply, fill #0
  Filled 2024-02-02: qty 1, 90d supply, fill #1

## 2023-10-07 NOTE — Telephone Encounter (Signed)
 Submitted auth request to transition pt to SP, auth was approved. Please send rx to [facility], thank you!  Auth#: [id number] (01/13/23-01/12/24)

## 2023-10-07 NOTE — Telephone Encounter (Signed)
 Rx sent to [facility], [place].

## 2023-10-07 NOTE — Addendum Note (Signed)
 Addended by: SHONA SAVANT A on: 10/07/2023 03:51 PM   Modules accepted: Orders

## 2023-10-08 ENCOUNTER — Other Ambulatory Visit: Payer: Self-pay

## 2023-10-08 ENCOUNTER — Other Ambulatory Visit (HOSPITAL_COMMUNITY): Payer: Self-pay

## 2023-10-13 ENCOUNTER — Telehealth: Payer: Self-pay | Admitting: Neurology

## 2023-10-13 NOTE — Telephone Encounter (Signed)
 Called pt and informed her of Dr. Sharion departure. She was agreeable to r/s with Lauraine Born, she has seen her before. We r/s her Botox  for 11/5 @ 8:45 am. She is aware that Lauraine will only inject Botox  protocol sites and will not inject her masseters.

## 2023-10-13 NOTE — Telephone Encounter (Signed)
 Pt states Dr. Ines was going to be taking over her Sumatriptan  prescription once she ran out and she thinks she will run out within the next week. She wants to see if this is able to be filled. Sending to both pods because she is currently scheduled with Lauraine but is one of Dr. Sharion pts.

## 2023-10-14 NOTE — Telephone Encounter (Signed)
 Called Pt  , PT would like medication(SUMAtriptan  (IMITREX ) 100 MG tablet ) sent to   [facility] [id number] [place], [place] - [address] Phone: [phone]  Fax: [phone]

## 2023-10-15 ENCOUNTER — Encounter: Payer: Self-pay | Admitting: Family

## 2023-10-15 ENCOUNTER — Encounter: Payer: Self-pay | Admitting: Neurology

## 2023-10-18 ENCOUNTER — Other Ambulatory Visit: Payer: Self-pay | Admitting: Family

## 2023-10-18 ENCOUNTER — Other Ambulatory Visit: Payer: Self-pay

## 2023-10-18 ENCOUNTER — Other Ambulatory Visit (HOSPITAL_COMMUNITY): Payer: Self-pay

## 2023-10-18 MED ORDER — SUMATRIPTAN SUCCINATE 100 MG PO TABS: ORAL_TABLET | ORAL | 1 refills | Status: DC

## 2023-10-18 NOTE — Progress Notes (Unsigned)
  Specialty Pharmacy Initial Fill Coordination Note  Jacqueline Meza is a 78 y.o. female contacted today regarding initial fill of specialty medication(s) OnabotulinumtoxinA  (Botox )   Patient requested Courier to Provider Office   Delivery date: 10/21/23   Verified address: [facility], [address], [place], [place] [address]   Medication will be filled on 10/19/2023.   Patient is aware of 0 copayment.

## 2023-10-19 ENCOUNTER — Other Ambulatory Visit: Payer: Self-pay

## 2023-10-21 NOTE — Telephone Encounter (Signed)
 Noted

## 2023-11-01 ENCOUNTER — Ambulatory Visit: Admitting: Neurology

## 2023-11-09 DIAGNOSIS — R1319 Other dysphagia: Secondary | ICD-10-CM | POA: Diagnosis not present

## 2023-11-09 DIAGNOSIS — K5909 Other constipation: Secondary | ICD-10-CM | POA: Diagnosis not present

## 2023-11-09 DIAGNOSIS — Z1211 Encounter for screening for malignant neoplasm of colon: Secondary | ICD-10-CM | POA: Diagnosis not present

## 2023-11-17 ENCOUNTER — Ambulatory Visit: Admitting: Neurology

## 2023-11-17 VITALS — BP 132/71 | HR 81

## 2023-11-17 DIAGNOSIS — G43709 Chronic migraine without aura, not intractable, without status migrainosus: Secondary | ICD-10-CM | POA: Diagnosis not present

## 2023-11-17 MED ORDER — SUMATRIPTAN SUCCINATE 100 MG PO TABS: ORAL_TABLET | ORAL | 11 refills | Status: AC

## 2023-11-17 MED ORDER — EMGALITY 120 MG/ML ~~LOC~~ SOAJ: 120.0000 mg | SUBCUTANEOUS | 11 refills | Status: AC

## 2023-11-17 MED ORDER — ONABOTULINUMTOXINA 200 UNITS IJ SOLR
155.0000 [IU] | Freq: Once | INTRAMUSCULAR | Status: AC
  Administered 2023-11-17: 155 [IU] via INTRAMUSCULAR

## 2023-11-17 NOTE — Progress Notes (Signed)
 Botox - 200 units x 1 vial Lot: [id number] Expiration: 11/2025 NDC: 9976-6078-97  Bacteriostatic 0.9% Sodium Chloride- 4 mL  Lot: [id number] Expiration: 11/11/2024 NDC: 9590-8033-97  Dx: H56.290 S/P  Witnessed by Rojean DEL CMA

## 2023-11-17 NOTE — Progress Notes (Signed)
   BOTOX  PROCEDURE NOTE FOR MIGRAINE HEADACHE  HISTORY: [name] is here for Botox . Last was 08/09/23 with Dr.Ahern. Migraines are 100% better since starting the Botox  and Emgality . Only 3 since last injection. Takes imitrex  if needed with excellent benefit. Her identical twin sister has Alzheimer's [name] patient).   Description of procedure:  The patient was placed in a sitting position. The standard protocol was used for Botox  as follows, with 5 units of Botox  injected at each site:   -Procerus muscle, midline injection  -Corrugator muscle, bilateral injection  -Frontalis muscle, bilateral injection, with 2 sites each side, medial injection was performed in the upper one third of the frontalis muscle, in the region vertical from the medial inferior edge of the superior orbital rim. The lateral injection was again in the upper one third of the forehead vertically above the lateral limbus of the cornea, 1.5 cm lateral to the medial injection site.  -Temporalis muscle injection, 4 sites, bilaterally. The first injection was 3 cm above the tragus of the ear, second injection site was 1.5 cm to 3 cm up from the first injection site in line with the tragus of the ear. The third injection site was 1.5-3 cm forward between the first 2 injection sites. The fourth injection site was 1.5 cm posterior to the second injection site.  -Occipitalis muscle injection, 3 sites, bilaterally. The first injection was done one half way between the occipital protuberance and the tip of the mastoid process behind the ear. The second injection site was done lateral and superior to the first, 1 fingerbreadth from the first injection. The third injection site was 1 fingerbreadth superiorly and medially from the first injection site.  -Cervical paraspinal muscle injection, 2 sites, bilateral, the first injection site was 1 cm from the midline of the cervical spine, 3 cm inferior to the lower border of the occipital  protuberance. The second injection site was 1.5 cm superiorly and laterally to the first injection site.  -Trapezius muscle injection was performed at 3 sites, bilaterally. The first injection site was in the upper trapezius muscle halfway between the inflection point of the neck, and the acromion. The second injection site was one half way between the acromion and the first injection site. The third injection was done between the first injection site and the inflection point of the neck.   A 200 unit bottle of Botox  was used, 155 units were injected, the rest of the Botox  was wasted. The patient tolerated the procedure well, there were no complications of the above procedure.  Botox  NDC 9976-6078-97 Lot number [id number] Expiration date 11/2025 SP   Continue Emgality  and Botox .  Use Imitrex  as needed.  She will be reassigned to another neurologist here and have a visit within about 6 to 8 months to establish care.  Her sister has Alzheimer's disease. Fortunately, [name] does not exhibit any signs at this time. She will be establishing migraine care at next appointment. I will continue her Botox .

## 2023-11-23 ENCOUNTER — Other Ambulatory Visit (HOSPITAL_COMMUNITY): Payer: Self-pay

## 2023-11-23 ENCOUNTER — Telehealth: Payer: Self-pay

## 2023-11-23 NOTE — Telephone Encounter (Signed)
   Receieved a request from [facility] to do PA for Emgality -PA not needed yet.

## 2023-11-29 ENCOUNTER — Ambulatory Visit

## 2023-11-29 DIAGNOSIS — K222 Esophageal obstruction: Secondary | ICD-10-CM | POA: Diagnosis not present

## 2023-11-29 DIAGNOSIS — Z1211 Encounter for screening for malignant neoplasm of colon: Secondary | ICD-10-CM | POA: Diagnosis not present

## 2023-11-29 DIAGNOSIS — K640 First degree hemorrhoids: Secondary | ICD-10-CM | POA: Diagnosis not present

## 2023-11-29 DIAGNOSIS — R1310 Dysphagia, unspecified: Secondary | ICD-10-CM | POA: Diagnosis not present

## 2023-11-29 DIAGNOSIS — K2289 Other specified disease of esophagus: Secondary | ICD-10-CM | POA: Diagnosis not present

## 2023-12-05 ENCOUNTER — Encounter: Payer: Self-pay | Admitting: Neurology

## 2023-12-31 ENCOUNTER — Other Ambulatory Visit: Payer: Self-pay

## 2024-01-10 ENCOUNTER — Other Ambulatory Visit: Payer: Self-pay

## 2024-01-20 ENCOUNTER — Telehealth: Payer: Self-pay | Admitting: Neurology

## 2024-01-20 NOTE — Telephone Encounter (Signed)
 Submitted auth renewal request via CMM, status is pending. Key: [id number]

## 2024-01-24 NOTE — Telephone Encounter (Signed)
 Auth#: [id number] (01/13/24-01/11/25)

## 2024-02-02 ENCOUNTER — Other Ambulatory Visit: Payer: Self-pay

## 2024-02-02 NOTE — Progress Notes (Signed)
 Specialty Pharmacy Refill Coordination Note  CECI TALIAFERRO is a 78 y.o. female contacted today regarding refills of specialty medication(s) OnabotulinumtoxinA  (Botox )   Patient requested Courier to Provider Office   Delivery date: 02/14/24   Verified address: [facility], [address], [place], [place] [address]   Medication will be filled on: 02/11/24

## 2024-02-11 ENCOUNTER — Other Ambulatory Visit: Payer: Self-pay

## 2024-02-15 ENCOUNTER — Other Ambulatory Visit: Payer: Self-pay

## 2024-02-17 ENCOUNTER — Encounter: Payer: Self-pay | Admitting: Neurology

## 2024-02-17 ENCOUNTER — Ambulatory Visit: Admitting: Neurology

## 2024-02-17 VITALS — BP 141/83 | HR 79 | Ht 62.0 in | Wt 137.5 lb

## 2024-02-17 DIAGNOSIS — G43709 Chronic migraine without aura, not intractable, without status migrainosus: Secondary | ICD-10-CM

## 2024-02-17 MED ORDER — ONABOTULINUMTOXINA 200 UNITS IJ SOLR
155.0000 [IU] | Freq: Once | INTRAMUSCULAR | Status: AC
  Administered 2024-02-17: 155 [IU] via INTRAMUSCULAR

## 2024-02-17 NOTE — Progress Notes (Signed)
" ° °  BOTOX  PROCEDURE NOTE FOR MIGRAINE HEADACHE  HISTORY: Jacqueline Meza is here for Botox . Last was 11/17/23 with me. Botox  has done great! Only 2 migraines in the last 3 months. Remains on Emgality , uses Imitrex  as needed.    Description of procedure:  The patient was placed in a sitting position. The standard protocol was used for Botox  as follows, with 5 units of Botox  injected at each site:   -Procerus muscle, midline injection  -Corrugator muscle, bilateral injection  -Frontalis muscle, bilateral injection, with 2 sites each side, medial injection was performed in the upper one third of the frontalis muscle, in the region vertical from the medial inferior edge of the superior orbital rim. The lateral injection was again in the upper one third of the forehead vertically above the lateral limbus of the cornea, 1.5 cm lateral to the medial injection site.  -Temporalis muscle injection, 4 sites, bilaterally. The first injection was 3 cm above the tragus of the ear, second injection site was 1.5 cm to 3 cm up from the first injection site in line with the tragus of the ear. The third injection site was 1.5-3 cm forward between the first 2 injection sites. The fourth injection site was 1.5 cm posterior to the second injection site.  -Occipitalis muscle injection, 3 sites, bilaterally. The first injection was done one half way between the occipital protuberance and the tip of the mastoid process behind the ear. The second injection site was done lateral and superior to the first, 1 fingerbreadth from the first injection. The third injection site was 1 fingerbreadth superiorly and medially from the first injection site.  -Cervical paraspinal muscle injection, 2 sites, bilateral, the first injection site was 1 cm from the midline of the cervical spine, 3 cm inferior to the lower border of the occipital protuberance. The second injection site was 1.5 cm superiorly and laterally to the first injection  site.  -Trapezius muscle injection was performed at 3 sites, bilaterally. The first injection site was in the upper trapezius muscle halfway between the inflection point of the neck, and the acromion. The second injection site was one half way between the acromion and the first injection site. The third injection was done between the first injection site and the inflection point of the neck.   A 200 unit bottle of Botox  was used, 155 units were injected, the rest of the Botox  was wasted. The patient tolerated the procedure well, there were no complications of the above procedure.  Botox  NDC 9976-6078-97 Lot number [id number] Expiration date 03/2026 SP   "

## 2024-02-17 NOTE — Progress Notes (Signed)
 Botox - 200 units x 1 vial Lot: [id number] Expiration: 2028/03 NDC: 0023-3921-02  Bacteriostatic 0.9% Sodium Chloride- 4 mL  Lot: [id number] Expiration: 11/11/24 NDC: 9590803397  Dx: H56.290  S/P  Witnessed by DELON

## 2024-03-20 ENCOUNTER — Encounter

## 2024-03-20 ENCOUNTER — Other Ambulatory Visit

## 2024-05-23 ENCOUNTER — Ambulatory Visit: Admitting: Neurology

## 2024-05-30 ENCOUNTER — Ambulatory Visit: Admitting: Dermatology

## 2024-06-07 ENCOUNTER — Ambulatory Visit: Admitting: Dermatology

## 2024-06-19 ENCOUNTER — Ambulatory Visit: Admitting: Neurology

## 2024-07-03 ENCOUNTER — Encounter: Admitting: Family

## 2024-08-14 ENCOUNTER — Ambulatory Visit
